# Patient Record
Sex: Male | Born: 1965 | Race: White | Hispanic: No | Marital: Married | Smoking: Former smoker
Health system: Southern US, Community
[De-identification: ages and names within clinical notes are randomized; demographics above are authoritative.]

## PROBLEM LIST (undated history)

## (undated) DIAGNOSIS — K219 Gastro-esophageal reflux disease without esophagitis: Secondary | ICD-10-CM

## (undated) DIAGNOSIS — E785 Hyperlipidemia, unspecified: Secondary | ICD-10-CM

## (undated) DIAGNOSIS — F419 Anxiety disorder, unspecified: Secondary | ICD-10-CM

## (undated) DIAGNOSIS — R079 Chest pain, unspecified: Secondary | ICD-10-CM

## (undated) DIAGNOSIS — I10 Essential (primary) hypertension: Secondary | ICD-10-CM

## (undated) DIAGNOSIS — T7840XA Allergy, unspecified, initial encounter: Secondary | ICD-10-CM

## (undated) DIAGNOSIS — E559 Vitamin D deficiency, unspecified: Secondary | ICD-10-CM

## (undated) DIAGNOSIS — R0602 Shortness of breath: Secondary | ICD-10-CM

## (undated) DIAGNOSIS — E291 Testicular hypofunction: Secondary | ICD-10-CM

## (undated) DIAGNOSIS — E669 Obesity, unspecified: Secondary | ICD-10-CM

## (undated) DIAGNOSIS — R0982 Postnasal drip: Secondary | ICD-10-CM

## (undated) DIAGNOSIS — R7303 Prediabetes: Secondary | ICD-10-CM

## (undated) DIAGNOSIS — R7309 Other abnormal glucose: Secondary | ICD-10-CM

## (undated) HISTORY — PX: 24 HOUR PH STUDY: SHX5419

## (undated) HISTORY — DX: Allergy, unspecified, initial encounter: T78.40XA

## (undated) HISTORY — PX: POLYPECTOMY: SHX149

## (undated) HISTORY — DX: Other abnormal glucose: R73.09

## (undated) HISTORY — PX: COLONOSCOPY: SHX174

## (undated) HISTORY — DX: Hyperlipidemia, unspecified: E78.5

## (undated) HISTORY — DX: Gastro-esophageal reflux disease without esophagitis: K21.9

## (undated) HISTORY — DX: Postnasal drip: R09.82

## (undated) HISTORY — DX: Testicular hypofunction: E29.1

## (undated) HISTORY — DX: Chest pain, unspecified: R07.9

## (undated) HISTORY — PX: OTHER SURGICAL HISTORY: SHX169

## (undated) HISTORY — DX: Obesity, unspecified: E66.9

## (undated) HISTORY — DX: Prediabetes: R73.03

## (undated) HISTORY — DX: Vitamin D deficiency, unspecified: E55.9

## (undated) HISTORY — DX: Shortness of breath: R06.02

## (undated) HISTORY — DX: Essential (primary) hypertension: I10

## (undated) HISTORY — DX: Anxiety disorder, unspecified: F41.9

---

## 1998-08-16 ENCOUNTER — Ambulatory Visit (HOSPITAL_COMMUNITY): Admission: RE | Admit: 1998-08-16 | Discharge: 1998-08-16 | Payer: Self-pay | Admitting: Internal Medicine

## 1998-08-16 ENCOUNTER — Encounter: Payer: Self-pay | Admitting: Internal Medicine

## 1999-08-22 ENCOUNTER — Encounter: Payer: Self-pay | Admitting: Internal Medicine

## 1999-08-22 ENCOUNTER — Ambulatory Visit (HOSPITAL_COMMUNITY): Admission: RE | Admit: 1999-08-22 | Discharge: 1999-08-22 | Payer: Self-pay | Admitting: Internal Medicine

## 2002-05-06 HISTORY — PX: SQUAMOUS CELL CARCINOMA EXCISION: SHX2433

## 2002-05-06 HISTORY — PX: SKIN CANCER EXCISION: SHX779

## 2004-01-19 ENCOUNTER — Ambulatory Visit (HOSPITAL_COMMUNITY): Admission: RE | Admit: 2004-01-19 | Discharge: 2004-01-19 | Payer: Self-pay | Admitting: Internal Medicine

## 2009-01-20 ENCOUNTER — Ambulatory Visit (HOSPITAL_COMMUNITY): Admission: RE | Admit: 2009-01-20 | Discharge: 2009-01-20 | Payer: Self-pay | Admitting: Internal Medicine

## 2010-06-15 ENCOUNTER — Ambulatory Visit (HOSPITAL_BASED_OUTPATIENT_CLINIC_OR_DEPARTMENT_OTHER)
Admission: RE | Admit: 2010-06-15 | Discharge: 2010-06-15 | Disposition: A | Discharge status: Home / Self Care | Payer: Medicare HMO | Attending: Urology | Admitting: Urology

## 2010-06-15 ENCOUNTER — Other Ambulatory Visit: Payer: Self-pay | Admitting: Urology

## 2010-06-15 DIAGNOSIS — D294 Benign neoplasm of scrotum: Secondary | ICD-10-CM | POA: Insufficient documentation

## 2010-06-15 DIAGNOSIS — Z01810 Encounter for preprocedural cardiovascular examination: Secondary | ICD-10-CM | POA: Insufficient documentation

## 2010-06-15 DIAGNOSIS — Z01812 Encounter for preprocedural laboratory examination: Secondary | ICD-10-CM | POA: Insufficient documentation

## 2010-06-15 LAB — POCT I-STAT 4, (NA,K, GLUC, HGB,HCT)
Glucose, Bld: 111 mg/dL — ABNORMAL HIGH (ref 70–99)
HCT: 48 % (ref 39.0–52.0)
Hemoglobin: 16.3 g/dL (ref 13.0–17.0)
Potassium: 4.1 mEq/L (ref 3.5–5.1)
Sodium: 141 mEq/L (ref 135–145)

## 2010-06-18 NOTE — Op Note (Signed)
  NAME:  Ryan Nguyen, Ryan Nguyen             ACCOUNT NO.:  [id number]  MEDICAL RECORD NO.:  [id number]          PATIENT TYPE:  AMB  LOCATION:  [facility]                         FACILITY:  [facility]  PHYSICIAN:  Maretta Bees. Vonita Moss, M.D.DATE OF BIRTH:  10/18/1965  DATE OF PROCEDURE:  06/15/2010 DATE OF DISCHARGE:                              OPERATIVE REPORT   PREOPERATIVE DIAGNOSIS:  Scrotal wall mole.  POSTOPERATIVE DIAGNOSIS:  Scrotal wall mole.  PROCEDURE:  Excision of a 3-cm scrotal mole.  SURGEON:  Maretta Bees. Vonita Moss, M.D.  ANESTHESIA:  MAC and local.  INDICATIONS:  This gentleman has had a longstanding brownish-colored mole on the scrotum that is becoming irritating and bothersome when it rubs on his clothing.  He wishes excision.  PROCEDURE IN DETAIL:  The patient was brought to the operating room and given conscious sedation.  The external genitalia were prepped and draped in the usual fashion and 0.5% Marcaine was injected under andaround this lesion on the scrotum for local anesthesia.  A knife blade was used to excise this totally.  A few small bleeders were electrocoagulated.  The scrotal incision was closed in two layers of running 3-0 chromic catgut.  The wound was cleaned and dressed with Neosporin and a dry sterile gauze.  Sponge, needle and instrument count was correct.  Blood loss was essentially zero.  He tolerated the procedure well.     Maretta Bees. Vonita Moss, M.D.     LJP/MEDQ  D:  06/15/2010  T:  06/15/2010  Job:  [id number]  cc:   Lucky Cowboy, M.D. Fax: [phone]  Electronically Signed by Larey Dresser M.D. on 06/18/2010 01:22:06 PM

## 2012-05-08 ENCOUNTER — Other Ambulatory Visit (HOSPITAL_COMMUNITY): Payer: Self-pay | Admitting: Internal Medicine

## 2012-05-08 ENCOUNTER — Ambulatory Visit (HOSPITAL_COMMUNITY)
Admission: RE | Admit: 2012-05-08 | Discharge: 2012-05-08 | Disposition: A | Discharge status: Home / Self Care | Payer: BC Managed Care – PPO | Source: Ambulatory Visit | Attending: Internal Medicine | Admitting: Internal Medicine

## 2012-05-08 DIAGNOSIS — R05 Cough: Secondary | ICD-10-CM | POA: Insufficient documentation

## 2012-05-08 DIAGNOSIS — Z87891 Personal history of nicotine dependence: Secondary | ICD-10-CM | POA: Insufficient documentation

## 2012-05-08 DIAGNOSIS — R059 Cough, unspecified: Secondary | ICD-10-CM | POA: Insufficient documentation

## 2012-05-08 DIAGNOSIS — J9819 Other pulmonary collapse: Secondary | ICD-10-CM | POA: Insufficient documentation

## 2012-05-08 DIAGNOSIS — R0602 Shortness of breath: Secondary | ICD-10-CM | POA: Insufficient documentation

## 2012-06-09 ENCOUNTER — Ambulatory Visit (HOSPITAL_COMMUNITY)
Admission: RE | Admit: 2012-06-09 | Discharge: 2012-06-09 | Disposition: A | Discharge status: Home / Self Care | Payer: BC Managed Care – PPO | Source: Ambulatory Visit | Attending: Internal Medicine | Admitting: Internal Medicine

## 2012-06-09 ENCOUNTER — Other Ambulatory Visit (HOSPITAL_COMMUNITY): Payer: Self-pay | Admitting: Internal Medicine

## 2012-06-09 DIAGNOSIS — J189 Pneumonia, unspecified organism: Secondary | ICD-10-CM | POA: Insufficient documentation

## 2012-06-09 DIAGNOSIS — R05 Cough: Secondary | ICD-10-CM | POA: Insufficient documentation

## 2012-06-09 DIAGNOSIS — R059 Cough, unspecified: Secondary | ICD-10-CM | POA: Insufficient documentation

## 2012-11-05 ENCOUNTER — Encounter: Payer: Self-pay | Admitting: Cardiovascular Disease

## 2012-12-14 ENCOUNTER — Encounter: Payer: Self-pay | Admitting: Cardiovascular Disease

## 2012-12-23 ENCOUNTER — Ambulatory Visit (INDEPENDENT_AMBULATORY_CARE_PROVIDER_SITE_OTHER): Payer: BC Managed Care – PPO | Admitting: Cardiovascular Disease

## 2012-12-23 ENCOUNTER — Encounter: Payer: Self-pay | Admitting: *Deleted

## 2012-12-23 VITALS — BP 126/86 | HR 61 | Ht 70.0 in | Wt 272.4 lb

## 2012-12-23 DIAGNOSIS — R9431 Abnormal electrocardiogram [ECG] [EKG]: Secondary | ICD-10-CM

## 2012-12-23 DIAGNOSIS — R0600 Dyspnea, unspecified: Secondary | ICD-10-CM

## 2012-12-23 DIAGNOSIS — R079 Chest pain, unspecified: Secondary | ICD-10-CM

## 2012-12-23 DIAGNOSIS — E119 Type 2 diabetes mellitus without complications: Secondary | ICD-10-CM

## 2012-12-23 DIAGNOSIS — E785 Hyperlipidemia, unspecified: Secondary | ICD-10-CM

## 2012-12-23 DIAGNOSIS — R0609 Other forms of dyspnea: Secondary | ICD-10-CM

## 2012-12-23 DIAGNOSIS — I10 Essential (primary) hypertension: Secondary | ICD-10-CM | POA: Insufficient documentation

## 2012-12-23 DIAGNOSIS — R0989 Other specified symptoms and signs involving the circulatory and respiratory systems: Secondary | ICD-10-CM

## 2012-12-23 DIAGNOSIS — R0602 Shortness of breath: Secondary | ICD-10-CM

## 2012-12-23 NOTE — Assessment & Plan Note (Signed)
Atypical but multiple risk factors and abnormal ECG  F/u stress myovue

## 2012-12-23 NOTE — Assessment & Plan Note (Signed)
Discussed low carb diet.  Target hemoglobin A1c is 6.5 or less.  Continue current medications. He understands diabetes is from weight gain

## 2012-12-23 NOTE — Assessment & Plan Note (Signed)
Cholesterol is at goal.  Continue current dose of statin and diet Rx.  No myalgias or side effects.  F/U  LFT's in 6 months. No results found for this basename: LDLCALC  Labs with pirmary           

## 2012-12-23 NOTE — Progress Notes (Signed)
Patient ID: Ryan Nguyen, male   DOB: 07-26-1965, 47 y.o.   MRN: [id number] 47 yo referred for dyspnea and chest pain.  No documented CAD  CRF;s HTN elevated lipids and and recent diagnosis of diabetes.  Issue is severe weight gain over the last 10 years.  This has caused DM, reflux and dyspnea.  He smoked 2ppd since age 83 and quit in January. Diet is bad with lots of carbs and mild shakes Sedentary and does not exercies 2 episodes of pain last 6 months Both after breakfast with GI overtones and not taking his reflux meds. No PND orthopnea Mild dependant edema.  Does not carry diagnosis of COPD but long time smoker No cough or sputum  Likely OSA.  No previous stress testing  ROS: Denies fever, malais, weight loss, blurry vision, decreased visual acuity, cough, sputum, SOB, hemoptysis, pleuritic pain, palpitaitons, heartburn, abdominal pain, melena, lower extremity edema, claudication, or rash.  All other systems reviewed and negative   General: Affect appropriate Obese white male HEENT: normal Neck supple with no adenopathy JVP normal no bruits no thyromegaly Lungs clear with mild expitory wheezing and good diaphragmatic motion Heart:  S1/S2 no murmur,rub, gallop or click PMI normal Abdomen: benighn, BS positve, no tenderness, no AAA no bruit.  No HSM or HJR Distal pulses intact with no bruits No edema Neuro non-focal Skin warm and dry No muscular weakness  Medications Current Outpatient Prescriptions  Medication Sig Dispense Refill  . atorvastatin (LIPITOR) 20 MG tablet Take 20 mg by mouth daily.      . citalopram (CELEXA) 40 MG tablet Take 40 mg by mouth daily.      Marland Kitchen dicyclomine (BENTYL) 20 MG tablet Take 20 mg by mouth every 6 (six) hours.      . metFORMIN (GLUCOPHAGE) 500 MG tablet Take 500 mg by mouth daily with breakfast.      . ranitidine (ZANTAC) 300 MG capsule Take 300 mg by mouth 2 (two) times daily.        No current facility-administered medications for this visit.     Allergies Bee venom and Chantix  Family History: Family History  Problem Relation Age of Onset  . Hypertension Mother   . Diabetes Sister   . Cancer Father     Lung cancer  . COPD Mother     Social History: History   Social History  . Marital Status: Married    Spouse Name: N/A    Number of Children: 2  . Years of Education: N/A   Occupational History  . Not on file.   Social History Main Topics  . Smoking status: Former Smoker    Quit date: 05/06/2012  . Smokeless tobacco: Not on file  . Alcohol Use: No  . Drug Use: No  . Sexual Activity: Not on file   Other Topics Concern  . Not on file   Social History Narrative  . No narrative on file    Electrocardiogram:  NSR rate 61 ICRBBB LAFB    Assessment and Plan

## 2012-12-23 NOTE — Patient Instructions (Addendum)
Your physician recommends that you schedule a follow-up appointment in:  AS NEEDED  Your physician recommends that you continue on your current medications as directed. Please refer to the Current Medication list given to you today.  Your physician has requested that you have an echocardiogram. Echocardiography is a painless test that uses sound waves to create images of your heart. It provides your doctor with information about the size and shape of your heart and how well your heart's chambers and valves are working. This procedure takes approximately one hour. There are no restrictions for this procedure.   Your physician has requested that you have en exercise stress myoview. For further information please visit [email or web address]. Please follow instruction sheet, as given.  

## 2012-12-23 NOTE — Assessment & Plan Note (Signed)
Clinically has some COPD and reactive airway disease Quit smoking in January F/U primary and consider PFT;s and inhaler F/U echo for RV and LV function.  Weight gain contributing Consider sleep study if he stays this heavy

## 2012-12-23 NOTE — Assessment & Plan Note (Signed)
Well controlled.  Continue current medications and low sodium Dash type diet.    

## 2013-01-11 ENCOUNTER — Ambulatory Visit (HOSPITAL_BASED_OUTPATIENT_CLINIC_OR_DEPARTMENT_OTHER): Payer: BC Managed Care – PPO | Admitting: Radiology

## 2013-01-11 ENCOUNTER — Ambulatory Visit (HOSPITAL_COMMUNITY): Payer: BC Managed Care – PPO | Attending: Cardiology

## 2013-01-11 VITALS — BP 106/69 | HR 53 | Ht 70.0 in | Wt 264.0 lb

## 2013-01-11 DIAGNOSIS — R0602 Shortness of breath: Secondary | ICD-10-CM

## 2013-01-11 DIAGNOSIS — I059 Rheumatic mitral valve disease, unspecified: Secondary | ICD-10-CM | POA: Insufficient documentation

## 2013-01-11 DIAGNOSIS — E785 Hyperlipidemia, unspecified: Secondary | ICD-10-CM | POA: Insufficient documentation

## 2013-01-11 DIAGNOSIS — I10 Essential (primary) hypertension: Secondary | ICD-10-CM | POA: Insufficient documentation

## 2013-01-11 DIAGNOSIS — I079 Rheumatic tricuspid valve disease, unspecified: Secondary | ICD-10-CM | POA: Insufficient documentation

## 2013-01-11 DIAGNOSIS — Z87891 Personal history of nicotine dependence: Secondary | ICD-10-CM | POA: Insufficient documentation

## 2013-01-11 DIAGNOSIS — R079 Chest pain, unspecified: Secondary | ICD-10-CM

## 2013-01-11 DIAGNOSIS — E119 Type 2 diabetes mellitus without complications: Secondary | ICD-10-CM | POA: Insufficient documentation

## 2013-01-11 DIAGNOSIS — R0609 Other forms of dyspnea: Secondary | ICD-10-CM | POA: Insufficient documentation

## 2013-01-11 DIAGNOSIS — R0989 Other specified symptoms and signs involving the circulatory and respiratory systems: Secondary | ICD-10-CM | POA: Insufficient documentation

## 2013-01-11 DIAGNOSIS — R072 Precordial pain: Secondary | ICD-10-CM | POA: Insufficient documentation

## 2013-01-11 DIAGNOSIS — R9431 Abnormal electrocardiogram [ECG] [EKG]: Secondary | ICD-10-CM

## 2013-01-11 MED ORDER — TECHNETIUM TC 99M SESTAMIBI GENERIC - CARDIOLITE
30.0000 | Freq: Once | INTRAVENOUS | Status: AC | PRN
  Administered 2013-01-11: 30 via INTRAVENOUS

## 2013-01-11 MED ORDER — TECHNETIUM TC 99M SESTAMIBI GENERIC - CARDIOLITE
10.0000 | Freq: Once | INTRAVENOUS | Status: AC | PRN
  Administered 2013-01-11: 10 via INTRAVENOUS

## 2013-01-11 NOTE — Progress Notes (Signed)
  [facility] 3 NUCLEAR MED [address] [place], [place] [address] [phone]    Cardiology Nuclear Med Study  Ryan Nguyen is a 47 y.o. male     MRN : [id number]     DOB: February 10, 1966  Procedure Date: 01/11/2013  Nuclear Med Background Indication for Stress Test:  Evaluation for Ischemia and Abnormal EKG History:  No previously documented CAD. Cardiac Risk Factors: History of Smoking, Hypertension, Lipids, NIDDM, Obesity and RBBB  Symptoms:  Chest Pain (last episode of chest discomfort was about 66-months ago) and DOE   Nuclear Pre-Procedure Caffeine/Decaff Intake:  None NPO After: 7:00pm   Lungs:  Clear. O2 Sat: 98% on room air. IV 0.9% NS with Angio Cath:  22g  IV Site: R Hand  IV Started by:  Cathlyn Parsons, RN  Chest Size (in):  60 Cup Size: n/a  Height: 5\' 10"  (1.778 m)  Weight:  264 lb (119.75 kg)  BMI:  Body mass index is 37.88 kg/(m^2). Tech Comments:  No Metformin this am    Nuclear Med Study 1 or 2 day study: 1 day  Stress Test Type:  Stress  Reading MD: Olga Millers, MD  Order Authorizing Provider:  Charlton Haws, MD  Resting Radionuclide: Technetium 14m Sestamibi  Resting Radionuclide Dose: 11.0 mCi   Stress Radionuclide:  Technetium 35m Sestamibi  Stress Radionuclide Dose: 33.0 mCi           Stress Protocol Rest HR: 53 Stress HR: 162  Rest BP: 106/69 Stress BP: 203/75  Exercise Time (min): 7:30 METS: 9.3   Predicted Max HR: 172 bpm % Max HR: 94.19 bpm Rate Pressure Product: 21308   Dose of Adenosine (mg):  n/a Dose of Lexiscan: n/a mg  Dose of Atropine (mg): n/a Dose of Dobutamine: n/a mcg/kg/min (at max HR)  Stress Test Technologist: Smiley Houseman, CMA-N  Nuclear Technologist:  Doyne Keel, CNMT     Rest Procedure:  Myocardial perfusion imaging was performed at rest 45 minutes following the intravenous administration of Technetium 52m Sestamibi.  Rest ECG: Sinus bradycardia, LAD, RVCD.  Stress Procedure:  The patient  exercised on the treadmill utilizing the Bruce Protocol for 7:30 minutes. The patient stopped due to fatigue and dyspnea and denied any chest pain.  Technetium 43m Sestamibi was injected at peak exercise and myocardial perfusion imaging was performed after a brief delay.  Stress ECG: Insignificant upsloping ST segment depression.  QPS Raw Data Images:  Acquisition technically good; LVE. Stress Images:  There is decreased uptake in the inferior lateral wall. Rest Images:  There is decreased uptake in the inferior lateral wall. Subtraction (SDS):  There is a fixed defect that is most consistent with a previous infarction. Transient Ischemic Dilatation (Normal <1.22):  n/a Lung/Heart Ratio (Normal <0.45):  0.49  Quantitative Gated Spect Images QGS EDV:  132 ml QGS ESV:  64 ml  Impression Exercise Capacity:  Fair exercise capacity. BP Response:  Normal blood pressure response. Clinical Symptoms:  There is dyspnea. ECG Impression:  Insignificant upsloping ST segment depression. Comparison with Prior Nuclear Study: No previous nuclear study performed  Overall Impression:  Low risk stress nuclear study with a fixed, medium size, severe intensity inferior lateral defect consistent with prior infarct; no significant ischemia.  LV Ejection Fraction: 51%.  LV Wall Motion:  Akinesis of the inferior lateral wall.   Olga Millers

## 2013-01-11 NOTE — Progress Notes (Signed)
Echocardiogram performed.  

## 2013-01-26 ENCOUNTER — Encounter: Payer: Self-pay | Admitting: Cardiovascular Disease

## 2013-01-26 ENCOUNTER — Encounter: Payer: Self-pay | Admitting: *Deleted

## 2013-01-26 DIAGNOSIS — E291 Testicular hypofunction: Secondary | ICD-10-CM | POA: Insufficient documentation

## 2013-01-27 ENCOUNTER — Encounter: Payer: Self-pay | Admitting: Cardiovascular Disease

## 2013-01-27 ENCOUNTER — Ambulatory Visit (INDEPENDENT_AMBULATORY_CARE_PROVIDER_SITE_OTHER): Payer: BC Managed Care – PPO | Admitting: Cardiovascular Disease

## 2013-01-27 ENCOUNTER — Encounter: Payer: Self-pay | Admitting: Cardiology

## 2013-01-27 VITALS — BP 118/76 | HR 53 | Ht 70.0 in | Wt 269.0 lb

## 2013-01-27 DIAGNOSIS — E785 Hyperlipidemia, unspecified: Secondary | ICD-10-CM

## 2013-01-27 DIAGNOSIS — I10 Essential (primary) hypertension: Secondary | ICD-10-CM

## 2013-01-27 DIAGNOSIS — Z01812 Encounter for preprocedural laboratory examination: Secondary | ICD-10-CM

## 2013-01-27 DIAGNOSIS — R079 Chest pain, unspecified: Secondary | ICD-10-CM

## 2013-01-27 NOTE — Assessment & Plan Note (Signed)
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

## 2013-01-27 NOTE — Progress Notes (Signed)
Patient ID: Ryan Nguyen, male   DOB: [birth year], 47 y.o.   MRN: [id number] 47 yo referred for dyspnea and chest pain. No documented CAD CRF;s HTN elevated lipids and and recent diagnosis of diabetes. Issue is severe weight gain over the last 10 years. This has caused DM, reflux and dyspnea. He smoked 2ppd since age 89 and quit in January. Diet is bad with lots of carbs and mild shakes Sedentary and does not exercies 2 episodes of pain last 6 months Both after breakfast with GI overtones and not taking his reflux meds. No PND orthopnea Mild dependant edema. Does not carry diagnosis of COPD but long time smoker No cough or sputum Likely OSA. No previous stress testing  F/U myovue abnormal  12/23/12 Overall Impression: Low risk stress nuclear study with a fixed, medium size, severe intensity inferior lateral defect consistent with prior infarct; no significant ischemia.  LV Ejection Fraction: 51%. LV Wall Motion: Akinesis of the inferior lateral wall.  Discussed results  With ? Of MI and need to know if CAD present recommended cath  He wants to do it in October  Scheduled for 10/17  ROS: Denies fever, malais, weight loss, blurry vision, decreased visual acuity, cough, sputum, SOB, hemoptysis, pleuritic pain, palpitaitons, heartburn, abdominal pain, melena, lower extremity edema, claudication, or rash.  All other systems reviewed and negative  General: Affect appropriate Healthy:  appears stated age HEENT: normal Neck supple with no adenopathy JVP normal no bruits no thyromegaly Lungs clear with no wheezing and good diaphragmatic motion Heart:  S1/S2 no murmur, no rub, gallop or click PMI normal Abdomen: benighn, BS positve, no tenderness, no AAA no bruit.  No HSM or HJR Distal pulses intact with no bruits No edema Neuro non-focal Skin warm and dry No muscular weakness   Current Outpatient Prescriptions  Medication Sig Dispense Refill  . atenolol (TENORMIN) 25 MG tablet Take 25 mg by  mouth daily.      Marland Kitchen atorvastatin (LIPITOR) 20 MG tablet Take 20 mg by mouth daily.      . citalopram (CELEXA) 40 MG tablet Take 40 mg by mouth daily.      . metFORMIN (GLUCOPHAGE) 500 MG tablet Take 500 mg by mouth daily with breakfast.      . ranitidine (ZANTAC) 300 MG capsule Take 300 mg by mouth 2 (two) times daily.        No current facility-administered medications for this visit.    Allergies  Bee venom and Chantix  Electrocardiogram:  8/20 SR LAFB ICRBBB   Assessment and Plan

## 2013-01-27 NOTE — Patient Instructions (Addendum)
Your physician recommends that you return for lab work in: 2 weeks,  BMET, CBC, PTINR  Your physician has requested that you have a Left Heart Radial cardiac catheterization. Cardiac catheterization is used to diagnose and/or treat various heart conditions. Doctors may recommend this procedure for a number of different reasons. The most common reason is to evaluate chest pain. Chest pain can be a symptom of coronary artery disease (CAD), and cardiac catheterization can show whether plaque is narrowing or blocking your heart's arteries. This procedure is also used to evaluate the valves, as well as measure the blood flow and oxygen levels in different parts of your heart. For further information please visit [email or web address] Please follow instruction sheet, as given.  Your physician recommends that you continue on your current medications as directed. Please refer to the Current Medication list given to you today.

## 2013-01-27 NOTE — Assessment & Plan Note (Signed)
Well controlled.  Continue current medications and low sodium Dash type diet.    

## 2013-01-27 NOTE — Assessment & Plan Note (Signed)
Cholesterol is at goal.  Continue current dose of statin and diet Rx.  No myalgias or side effects.  F/U  LFT's in 6 months. No results found for this basename: LDLCALC             

## 2013-01-29 ENCOUNTER — Other Ambulatory Visit: Payer: Self-pay | Admitting: Cardiovascular Disease

## 2013-01-29 DIAGNOSIS — R079 Chest pain, unspecified: Secondary | ICD-10-CM

## 2013-02-10 ENCOUNTER — Encounter (HOSPITAL_COMMUNITY): Payer: Self-pay | Admitting: Pharmacy Technician

## 2013-02-15 ENCOUNTER — Other Ambulatory Visit (INDEPENDENT_AMBULATORY_CARE_PROVIDER_SITE_OTHER): Payer: BC Managed Care – PPO

## 2013-02-15 DIAGNOSIS — Z01812 Encounter for preprocedural laboratory examination: Secondary | ICD-10-CM

## 2013-02-15 LAB — CBC WITH DIFFERENTIAL/PLATELET
Basophils Absolute: 0 10*3/uL (ref 0.0–0.1)
Eosinophils Absolute: 0.2 10*3/uL (ref 0.0–0.7)
Lymphocytes Relative: 27.1 % (ref 12.0–46.0)
MCHC: 34 g/dL (ref 30.0–36.0)
Neutrophils Relative %: 61.3 % (ref 43.0–77.0)
Platelets: 153 10*3/uL (ref 150.0–400.0)
RBC: 4.27 Mil/uL (ref 4.22–5.81)
RDW: 12.7 % (ref 11.5–14.6)

## 2013-02-15 LAB — BASIC METABOLIC PANEL
CO2: 28 mEq/L (ref 19–32)
Calcium: 9 mg/dL (ref 8.4–10.5)
Creatinine, Ser: 1 mg/dL (ref 0.4–1.5)
Glucose, Bld: 136 mg/dL — ABNORMAL HIGH (ref 70–99)

## 2013-02-15 LAB — PROTIME-INR
INR: 1.1 ratio — ABNORMAL HIGH (ref 0.8–1.0)
Prothrombin Time: 11.1 s (ref 10.2–12.4)

## 2013-02-19 ENCOUNTER — Ambulatory Visit (HOSPITAL_COMMUNITY)
Admission: RE | Admit: 2013-02-19 | Discharge: 2013-02-19 | Disposition: A | Discharge status: Home / Self Care | Payer: BC Managed Care – PPO | Source: Ambulatory Visit | Attending: Cardiovascular Disease | Admitting: Cardiovascular Disease

## 2013-02-19 ENCOUNTER — Encounter (HOSPITAL_COMMUNITY)
Admission: RE | Disposition: A | Discharge status: Home / Self Care | Payer: Self-pay | Source: Ambulatory Visit | Attending: Cardiovascular Disease

## 2013-02-19 DIAGNOSIS — I10 Essential (primary) hypertension: Secondary | ICD-10-CM | POA: Insufficient documentation

## 2013-02-19 DIAGNOSIS — R079 Chest pain, unspecified: Secondary | ICD-10-CM

## 2013-02-19 DIAGNOSIS — R9439 Abnormal result of other cardiovascular function study: Secondary | ICD-10-CM | POA: Insufficient documentation

## 2013-02-19 DIAGNOSIS — F17200 Nicotine dependence, unspecified, uncomplicated: Secondary | ICD-10-CM | POA: Insufficient documentation

## 2013-02-19 DIAGNOSIS — R0609 Other forms of dyspnea: Secondary | ICD-10-CM | POA: Insufficient documentation

## 2013-02-19 DIAGNOSIS — E119 Type 2 diabetes mellitus without complications: Secondary | ICD-10-CM | POA: Insufficient documentation

## 2013-02-19 DIAGNOSIS — R0989 Other specified symptoms and signs involving the circulatory and respiratory systems: Secondary | ICD-10-CM | POA: Insufficient documentation

## 2013-02-19 DIAGNOSIS — R635 Abnormal weight gain: Secondary | ICD-10-CM | POA: Insufficient documentation

## 2013-02-19 DIAGNOSIS — E785 Hyperlipidemia, unspecified: Secondary | ICD-10-CM | POA: Insufficient documentation

## 2013-02-19 HISTORY — PX: LEFT HEART CATHETERIZATION WITH CORONARY ANGIOGRAM: SHX5451

## 2013-02-19 LAB — GLUCOSE, CAPILLARY: Glucose-Capillary: 96 mg/dL (ref 70–99)

## 2013-02-19 SURGERY — LEFT HEART CATHETERIZATION WITH CORONARY ANGIOGRAM
Anesthesia: LOCAL

## 2013-02-19 MED ORDER — ASPIRIN EC 325 MG PO TBEC: 325.0000 mg | DELAYED_RELEASE_TABLET | Freq: Every day | ORAL | Status: DC

## 2013-02-19 MED ORDER — SODIUM CHLORIDE 0.9 % IV SOLN: 250.0000 mL | INTRAVENOUS | Status: DC | PRN

## 2013-02-19 MED ORDER — ASPIRIN 81 MG PO CHEW
324.0000 mg | CHEWABLE_TABLET | ORAL | Status: AC
  Administered 2013-02-19: 324 mg via ORAL
  Filled 2013-02-19: qty 4

## 2013-02-19 MED ORDER — HEPARIN SODIUM (PORCINE) 1000 UNIT/ML IJ SOLN
INTRAMUSCULAR | Status: AC
  Filled 2013-02-19: qty 1

## 2013-02-19 MED ORDER — MIDAZOLAM HCL 2 MG/2ML IJ SOLN
INTRAMUSCULAR | Status: AC
  Filled 2013-02-19: qty 2

## 2013-02-19 MED ORDER — FENTANYL CITRATE 0.05 MG/ML IJ SOLN
INTRAMUSCULAR | Status: AC
  Filled 2013-02-19: qty 2

## 2013-02-19 MED ORDER — LIDOCAINE HCL (PF) 1 % IJ SOLN
INTRAMUSCULAR | Status: AC
  Filled 2013-02-19: qty 30

## 2013-02-19 MED ORDER — NITROGLYCERIN 0.2 MG/ML ON CALL CATH LAB
INTRAVENOUS | Status: AC
  Filled 2013-02-19: qty 1

## 2013-02-19 MED ORDER — OXYCODONE-ACETAMINOPHEN 5-325 MG PO TABS: 1.0000 | ORAL_TABLET | ORAL | Status: DC | PRN

## 2013-02-19 MED ORDER — SODIUM CHLORIDE 0.9 % IJ SOLN: 3.0000 mL | Freq: Two times a day (BID) | INTRAMUSCULAR | Status: DC

## 2013-02-19 MED ORDER — HEPARIN (PORCINE) IN NACL 2-0.9 UNIT/ML-% IJ SOLN
INTRAMUSCULAR | Status: AC
  Filled 2013-02-19: qty 1000

## 2013-02-19 MED ORDER — ONDANSETRON HCL 4 MG/2ML IJ SOLN: 4.0000 mg | Freq: Four times a day (QID) | INTRAMUSCULAR | Status: DC | PRN

## 2013-02-19 MED ORDER — VERAPAMIL HCL 2.5 MG/ML IV SOLN
INTRAVENOUS | Status: AC
  Filled 2013-02-19: qty 2

## 2013-02-19 MED ORDER — ACETAMINOPHEN 325 MG PO TABS: 650.0000 mg | ORAL_TABLET | ORAL | Status: DC | PRN

## 2013-02-19 MED ORDER — SODIUM CHLORIDE 0.9 % IV SOLN
INTRAVENOUS | Status: DC
  Administered 2013-02-19: 08:00:00 via INTRAVENOUS

## 2013-02-19 MED ORDER — SODIUM CHLORIDE 0.9 % IJ SOLN: 3.0000 mL | INTRAMUSCULAR | Status: DC | PRN

## 2013-02-19 MED ORDER — SODIUM CHLORIDE 0.45 % IV SOLN: INTRAVENOUS | Status: DC

## 2013-02-19 NOTE — H&P (View-Only) (Signed)
Patient ID: Artavius Stearns, male   DOB: November 11, 1965, 47 y.o.   MRN: [id number] 47 yo referred for dyspnea and chest pain. No documented CAD CRF;s HTN elevated lipids and and recent diagnosis of diabetes. Issue is severe weight gain over the last 10 years. This has caused DM, reflux and dyspnea. He smoked 2ppd since age 47 and quit in January. Diet is bad with lots of carbs and mild shakes Sedentary and does not exercies 2 episodes of pain last 6 months Both after breakfast with GI overtones and not taking his reflux meds. No PND orthopnea Mild dependant edema. Does not carry diagnosis of COPD but long time smoker No cough or sputum Likely OSA. No previous stress testing  F/U myovue abnormal  12/23/12 Overall Impression: Low risk stress nuclear study with a fixed, medium size, severe intensity inferior lateral defect consistent with prior infarct; no significant ischemia.  LV Ejection Fraction: 51%. LV Wall Motion: Akinesis of the inferior lateral wall.  Discussed results  With ? Of MI and need to know if CAD present recommended cath  He wants to do it in October  Scheduled for 10/17  ROS: Denies fever, malais, weight loss, blurry vision, decreased visual acuity, cough, sputum, SOB, hemoptysis, pleuritic pain, palpitaitons, heartburn, abdominal pain, melena, lower extremity edema, claudication, or rash.  All other systems reviewed and negative  General: Affect appropriate Healthy:  appears stated age HEENT: normal Neck supple with no adenopathy JVP normal no bruits no thyromegaly Lungs clear with no wheezing and good diaphragmatic motion Heart:  S1/S2 no murmur, no rub, gallop or click PMI normal Abdomen: benighn, BS positve, no tenderness, no AAA no bruit.  No HSM or HJR Distal pulses intact with no bruits No edema Neuro non-focal Skin warm and dry No muscular weakness   Current Outpatient Prescriptions  Medication Sig Dispense Refill  . atenolol (TENORMIN) 25 MG tablet Take 25 mg by  mouth daily.      Marland Kitchen atorvastatin (LIPITOR) 20 MG tablet Take 20 mg by mouth daily.      . citalopram (CELEXA) 40 MG tablet Take 40 mg by mouth daily.      . metFORMIN (GLUCOPHAGE) 500 MG tablet Take 500 mg by mouth daily with breakfast.      . ranitidine (ZANTAC) 300 MG capsule Take 300 mg by mouth 2 (two) times daily.        No current facility-administered medications for this visit.    Allergies  Bee venom and Chantix  Electrocardiogram:  8/20 SR LAFB ICRBBB   Assessment and Plan

## 2013-02-19 NOTE — Interval H&P Note (Signed)
Cath Lab Visit (complete for each Cath Lab visit)  Clinical Evaluation Leading to the Procedure:   ACS: no  Non-ACS:    Anginal Classification: CCS II  Anti-ischemic medical therapy: Maximal Therapy (2 or more classes of medications)  Non-Invasive Test Results: Low-risk stress test findings: cardiac mortality <1%/year  Prior CABG: No previous CABG      History and Physical Interval Note:  02/19/2013 7:54 AM  Ryan Nguyen  has presented today for surgery, with the diagnosis of cp/abnormal mioview  The various methods of treatment have been discussed with the patient and family. After consideration of risks, benefits and other options for treatment, the patient has consented to  Procedure(s): LEFT HEART CATHETERIZATION WITH CORONARY ANGIOGRAM (N/A) as a surgical intervention .  The patient's history has been reviewed, patient examined, no change in status, stable for surgery.  I have reviewed the patient's chart and labs.  Questions were answered to the patient's satisfaction.     Charlton Haws

## 2013-02-19 NOTE — CV Procedure (Signed)
    Cardiac Catheterization Procedure Note  Name: Ryan Nguyen MRN: [id number] DOB: 10-Oct-1965  Procedure: Left Heart Cath, Selective Coronary Angiography, LV angiography  Indication: Chest Pain   Procedural Details: The right wrist was prepped, draped, and anesthetized with 1% lidocaine. Using the modified Seldinger technique, a 5 French sheath was introduced into the right radial artery. 3 mg of verapamil was administered through the sheath, weight-based unfractionated heparin was administered intravenously. Standard Judkins catheters were used for selective coronary angiography and left ventriculography. Catheter exchanges were performed over an exchange length guidewire. There were no immediate procedural complications. A TR band was used for radial hemostasis at the completion of the procedure.  The patient was transferred to the post catheterization recovery area for further monitoring.  Procedural Findings: Hemodynamics: AO 111/ 68  LV  110/10  Coronary angiography: Coronary dominance: right  Left mainstem:  Short segment fairly side by side ostia normal  Left anterior descending (LAD): Normal   IM: small vessel normal  D1: large branching vessel normal  Left circumflex (LCx): Normal  OM1: large branching vessel normal  AV Groove:  normal  Right coronary artery (RCA): dominant  Initially had catheter tip spasm in proximal vessel  Relieved with IC nitro.  Normal   PD: normal  PLA: normal  Left ventriculography: Left ventricular systolic function is normal, LVEF is estimated at 55-65%, there is no significant mitral regurgitation   Final Conclusions:  No significant CAD  Continue risk factor modification  Recommendations: Medical Rx  Charlton Haws 02/19/2013, 9:07 AM

## 2013-02-22 ENCOUNTER — Telehealth: Payer: Self-pay | Admitting: *Deleted

## 2013-02-22 MED ORDER — LISINOPRIL 5 MG PO TABS: 5.0000 mg | ORAL_TABLET | Freq: Every day | ORAL | Status: DC

## 2013-02-22 NOTE — Telephone Encounter (Signed)
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

## 2013-02-22 NOTE — Telephone Encounter (Signed)
         Lamarius, Dirr - 01/27/13 ','<More Detail >>       Wendall Stade, MD       Sent: Caleen Essex February 19, 2013 9:16 AM    To: Alois Cliche, LPN; Deliah Goody, RN                   Message     Needs lisinopril 5mg  called in Atenolol is d/c F/U with me in office next available                      Patient Demographics     Patient Name Sex DOB Age SSN Address Phone    Ryan Nguyen, Ryan Nguyen Male November 19, 1965 [id number] y.o.) [id number] [address] [facility] [place] [address] [phone] (Home) [phone] (Work) [phone] (Mobile)          Case Information           General Information     No case/log ID found          Wound Class     No data filed          Operative Note     No notes of this type exist for this encounter.          Nursing Notes     No notes of this type exist for this encounter.         Staff and Times           Anesthesia Staff Information     No case/log ID found         Pre-Incision Documentation           Verification at Registration     No case/log ID found          Positioning Information     No log ID found.         Closing Documentation           PNDS Information     No log ID found.         Equipment/Instruments/Supplies           Instruments     No case/log ID found          Supplies     No case/log ID found

## 2013-03-10 ENCOUNTER — Encounter: Payer: Self-pay | Admitting: Cardiovascular Disease

## 2013-03-10 ENCOUNTER — Ambulatory Visit (INDEPENDENT_AMBULATORY_CARE_PROVIDER_SITE_OTHER): Payer: BC Managed Care – PPO | Admitting: Cardiovascular Disease

## 2013-03-10 VITALS — BP 135/82 | HR 89 | Ht 70.0 in | Wt 271.2 lb

## 2013-03-10 DIAGNOSIS — E785 Hyperlipidemia, unspecified: Secondary | ICD-10-CM

## 2013-03-10 DIAGNOSIS — I10 Essential (primary) hypertension: Secondary | ICD-10-CM

## 2013-03-10 DIAGNOSIS — R079 Chest pain, unspecified: Secondary | ICD-10-CM

## 2013-03-10 DIAGNOSIS — E119 Type 2 diabetes mellitus without complications: Secondary | ICD-10-CM

## 2013-03-10 NOTE — Assessment & Plan Note (Signed)
Discussed low carb diet.  Target hemoglobin A1c is 6.5 or less.  Continue current medications.  

## 2013-03-10 NOTE — Assessment & Plan Note (Signed)
False positive stress test cath ok  Continue weiight loss and risk factor modification

## 2013-03-10 NOTE — Patient Instructions (Addendum)
Your physician wants you to follow-up in: YEAR WITH DR NISHAN  You will receive a reminder letter in the mail two months in advance. If you don't receive a letter, please call our office to schedule the follow-up appointment.  Your physician recommends that you continue on your current medications as directed. Please refer to the Current Medication list given to you today. 

## 2013-03-10 NOTE — Assessment & Plan Note (Signed)
Cholesterol is at goal.  Continue current dose of statin and diet Rx.  No myalgias or side effects.  F/U  LFT's in 6 months. No results found for this basename: LDLCALC             

## 2013-03-10 NOTE — Progress Notes (Signed)
Patient ID: Ryan Nguyen, male   DOB: [birth year], 47 y.o.   MRN: [id number] 47 yo referred for dyspnea and chest pain. No documented CAD CRF;s HTN elevated lipids and and recent diagnosis of diabetes. Issue is severe weight gain over the last 10 years. This has caused DM, reflux and dyspnea. He smoked 2ppd since age 76 and quit in January. Diet is bad with lots of carbs and mild shakes Sedentary and does not exercies 2 episodes of pain last 6 months Both after breakfast with GI overtones and not taking his reflux meds. No PND orthopnea Mild dependant edema. Does not carry diagnosis of COPD but long time smoker No cough or sputum Likely OSA. 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  LV Ejection Fraction: 51%. LV Wall Motion: Akinesis of the inferior lateral wall.  Cath 02/19/13 with no significant Epicardial CAD and normal EF   ROS: Denies fever, malais, weight loss, blurry vision, decreased visual acuity, cough, sputum, SOB, hemoptysis, pleuritic pain, palpitaitons, heartburn, abdominal pain, melena, lower extremity edema, claudication, or rash.  All other systems reviewed and negative  General: Affect appropriate Healthy:  appears stated age HEENT: normal Neck supple with no adenopathy JVP normal no bruits no thyromegaly Lungs clear with no wheezing and good diaphragmatic motion Heart:  S1/S2 no murmur, no rub, gallop or click PMI normal Abdomen: benighn, BS positve, no tenderness, no AAA no bruit.  No HSM or HJR Distal pulses intact with no bruits No edema Neuro non-focal Skin warm and dry No muscular weakness   Current Outpatient Prescriptions  Medication Sig Dispense Refill  . citalopram (CELEXA) 40 MG tablet Take 40 mg by mouth daily.      Marland Kitchen dicyclomine (BENTYL) 20 MG tablet Take 20 mg by  mouth 3 (three) times daily.      Marland Kitchen lisinopril (PRINIVIL,ZESTRIL) 5 MG tablet Take 1 tablet (5 mg total) by mouth daily.  30 tablet  6  . metFORMIN (GLUCOPHAGE) 500 MG tablet Take 500 mg by mouth 3 (three) times daily.       Marland Kitchen OVER THE COUNTER MEDICATION Take 1 tablet by mouth daily. Vitamin D 2  1.25 mg      . ranitidine (ZANTAC) 300 MG capsule Take 300 mg by mouth 2 (two) times daily.        No current facility-administered medications for this visit.    Allergies  Bee venom and Chantix  Electrocardiogram: 02/23/13 SR RATE 58 LAD [id number]   Assessment and Plan

## 2013-03-10 NOTE — Assessment & Plan Note (Signed)
Well controlled.  Continue current medications and low sodium Dash type diet.    

## 2013-04-06 ENCOUNTER — Other Ambulatory Visit: Payer: BC Managed Care – PPO

## 2013-04-13 ENCOUNTER — Other Ambulatory Visit: Payer: BC Managed Care – PPO

## 2013-04-19 ENCOUNTER — Other Ambulatory Visit: Payer: Self-pay | Admitting: Emergency Medicine

## 2013-04-19 MED ORDER — RANITIDINE HCL 300 MG PO CAPS: 300.0000 mg | ORAL_CAPSULE | Freq: Two times a day (BID) | ORAL | Status: DC

## 2013-04-29 ENCOUNTER — Encounter: Payer: Self-pay | Admitting: Internal Medicine

## 2013-04-29 DIAGNOSIS — I10 Essential (primary) hypertension: Secondary | ICD-10-CM

## 2013-04-29 DIAGNOSIS — E559 Vitamin D deficiency, unspecified: Secondary | ICD-10-CM | POA: Insufficient documentation

## 2013-04-29 DIAGNOSIS — E785 Hyperlipidemia, unspecified: Secondary | ICD-10-CM | POA: Insufficient documentation

## 2013-05-04 ENCOUNTER — Ambulatory Visit (INDEPENDENT_AMBULATORY_CARE_PROVIDER_SITE_OTHER): Payer: BC Managed Care – PPO | Admitting: Emergency Medicine

## 2013-05-04 ENCOUNTER — Encounter: Payer: Self-pay | Admitting: Emergency Medicine

## 2013-05-04 VITALS — BP 138/82 | HR 84 | Temp 97.8°F | Resp 18 | Ht 70.5 in | Wt 270.0 lb

## 2013-05-04 DIAGNOSIS — E782 Mixed hyperlipidemia: Secondary | ICD-10-CM

## 2013-05-04 DIAGNOSIS — R7309 Other abnormal glucose: Secondary | ICD-10-CM

## 2013-05-04 DIAGNOSIS — I10 Essential (primary) hypertension: Secondary | ICD-10-CM

## 2013-05-04 LAB — CBC WITH DIFFERENTIAL/PLATELET
Basophils Absolute: 0.1 10*3/uL (ref 0.0–0.1)
Basophils Relative: 1 % (ref 0–1)
Eosinophils Absolute: 0.5 10*3/uL (ref 0.0–0.7)
Lymphocytes Relative: 24 % (ref 12–46)
Lymphs Abs: 2 10*3/uL (ref 0.7–4.0)
MCH: 31.6 pg (ref 26.0–34.0)
MCHC: 34.7 g/dL (ref 30.0–36.0)
Neutro Abs: 4.9 10*3/uL (ref 1.7–7.7)
Neutrophils Relative %: 60 % (ref 43–77)
Platelets: 220 10*3/uL (ref 150–400)
RBC: 4.72 MIL/uL (ref 4.22–5.81)
RDW: 13 % (ref 11.5–15.5)

## 2013-05-04 LAB — HEMOGLOBIN A1C
Hgb A1c MFr Bld: 6 % — ABNORMAL HIGH (ref <5.7)
Mean Plasma Glucose: 126 mg/dL — ABNORMAL HIGH (ref <117)

## 2013-05-04 NOTE — Progress Notes (Signed)
   Subjective:    Patient ID: Ryan Nguyen, male    DOB: [birth year], 47 y.o.   MRN: [id number]  HPI Comments: 47 yo male  presents for 3 month F/U for HTN, Cholesterol, DM, D. Deficient follow up.  He notes he occasionally wakes up with bruise on right eye brow. He does not recall trauma, he notes it happens about once every couple of months and usually resolves in a few days. He denies any other bruising or FMH of blood/ clotting disorders.     Has started exercising more. He has been trying to lose weight. He is taking 3 metformin daily with out SE. A1C 5.8 INSULIN 64 T 156 TG 153 H 39 L 86   Review of Systems  Hematological: Bruises/bleeds easily.  All other systems reviewed and are negative.   BP 138/82  Pulse 84  Temp(Src) 97.8 F (36.6 C) (Temporal)  Resp 18  Ht 5' 10.5" (1.791 m)  Wt 270 lb (122.471 kg)  BMI 38.18 kg/m2     Objective:   Physical Exam  Nursing note and vitals reviewed. Constitutional: He is oriented to person, place, and time. He appears well-developed and well-nourished.  Obese  HENT:  Head: Normocephalic and atraumatic.  Right Ear: External ear normal.  Left Ear: External ear normal.  Nose: Nose normal.  Eyes: Conjunctivae and EOM are normal.  Neck: Normal range of motion. Neck supple. No JVD present. No thyromegaly present.  Cardiovascular: Normal rate, regular rhythm, normal heart sounds and intact distal pulses.   Pulmonary/Chest: Effort normal and breath sounds normal.  Abdominal: Soft. Bowel sounds are normal. He exhibits no distension and no mass. There is no tenderness. There is no rebound and no guarding.  Musculoskeletal: Normal range of motion. He exhibits no edema and no tenderness.  Lymphadenopathy:    He has no cervical adenopathy.  Neurological: He is alert and oriented to person, place, and time. He has normal reflexes. No cranial nerve deficit. Coordination normal.  Skin: Skin is warm and dry.  Right eye brow 1.5cm  ecchymosis  Psychiatric: He has a normal mood and affect. His behavior is normal. Judgment and thought content normal.          Assessment & Plan:  1.  3 month F/U for Obesity, HTN, Cholesterol,DM, D. Deficient. Needs healthy diet, cardio QD and obtain healthy weight. Check Labs, Check BP if >130/80 call office, Check BS if >200 call office. Freestyle Lite meter and education given 2. Obesity- may consider wt loss drug if no improvement by next OV 3. ? brusing on Eye brow- ? Trauma while sleeping, body scan performed without any other unusual findings, he will monitor and call with reults

## 2013-05-04 NOTE — Patient Instructions (Signed)
Obesity Obesity is having too much body fat and a body mass index (BMI) of 30 or more. BMI is a number based on your height and weight. The number is an estimate of how much body fat you have. Obesity can happen if you eat more calories than you can burn by exercising or other activity. It can cause major health problems or emergencies.  HOME CARE  Exercise and be active as told by your doctor. Try:  Using stairs when you can.  Parking farther away from store doors.  Gardening, biking, or walking.  Eat healthy foods and drinks that are low in calories. Eat more fruits and vegetables.  Limit fast food, sweets, and snack foods that are made with ingredients that are not natural (processed food).  Eat smaller amounts of food.  Keep a journal and write down what you eat every day. Websites can help with this.  Avoid drinking alcohol. Drink more water and drinks without calories.   Take vitamins and dietary pills (supplements) only as told by your doctor.  Try going to weight-loss support groups or classes to help lessen stress. Dieticians and counselors may also help. GET HELP RIGHT AWAY IF:  You have chest pain or tightness.  You have trouble breathing or feel short of breath.  You feel weak or have loss of feeling (numbness) in your legs.  You feel confused or have trouble talking.  You have sudden changes in your vision. MAKE SURE YOU:  Understand these instructions.  Will watch your condition.  Will get help right away if you are not doing well or get worse. Document Released: 07/15/2011 Document Reviewed: 07/15/2011 [facility] Patient Information 2014 [place], [place]. Diabetes and Exercise Exercising regularly is important. It is not just about losing weight. It has many health benefits, such as:  Improving your overall fitness, flexibility, and endurance.  Increasing your bone density.  Helping with weight control.  Decreasing your body fat.  Increasing your  muscle strength.  Reducing stress and tension.  Improving your overall health. People with diabetes who exercise gain additional benefits because exercise:  Reduces appetite.  Improves the body's use of blood sugar (glucose).  Helps lower or control blood glucose.  Decreases blood pressure.  Helps control blood lipids (such as cholesterol and triglycerides).  Improves the body's use of the hormone insulin by:  Increasing the body's insulin sensitivity.  Reducing the body's insulin needs.  Decreases the risk for heart disease because exercising:  Lowers cholesterol and triglycerides levels.  Increases the levels of good cholesterol (such as high-density lipoproteins [HDL]) in the body.  Lowers blood glucose levels. YOUR ACTIVITY PLAN  Choose an activity that you enjoy and set realistic goals. Your health care provider or diabetes educator can help you make an activity plan that works for you. You can break activities into 2 or 3 sessions throughout the day. Doing so is as good as one long session. Exercise ideas include:  Taking the dog for a walk.  Taking the stairs instead of the elevator.  Dancing to your favorite song.  Doing your favorite exercise with a friend. RECOMMENDATIONS FOR EXERCISING WITH TYPE 1 OR TYPE 2 DIABETES   Check your blood glucose before exercising. If blood glucose levels are greater than 240 mg/dL, check for urine ketones. Do not exercise if ketones are present.  Avoid injecting insulin into areas of the body that are going to be exercised. For example, avoid injecting insulin into:  The arms when playing tennis.  The legs when jogging.  Keep a record of:  Food intake before and after you exercise.  Expected peak times of insulin action.  Blood glucose levels before and after you exercise.  The type and amount of exercise you have done.  Review your records with your health care provider. Your health care provider will help you to  develop guidelines for adjusting food intake and insulin amounts before and after exercising.  If you take insulin or oral hypoglycemic agents, watch for signs and symptoms of hypoglycemia. They include:  Dizziness.  Shaking.  Sweating.  Chills.  Confusion.  Drink plenty of water while you exercise to prevent dehydration or heat stroke. Body water is lost during exercise and must be replaced.  Talk to your health care provider before starting an exercise program to make sure it is safe for you. Remember, almost any type of activity is better than none. Document Released: 07/13/2003 Document Revised: 12/23/2012 Document Reviewed: 09/29/2012 [facility] Patient Information 2014 [place], [place].

## 2013-05-05 LAB — BASIC METABOLIC PANEL WITH GFR
Calcium: 9.7 mg/dL (ref 8.4–10.5)
GFR, Est African American: >89 mL/min
GFR, Est Non African American: >89 mL/min
Glucose, Bld: 103 mg/dL — ABNORMAL HIGH (ref 70–99)
Potassium: 4.3 mEq/L (ref 3.5–5.3)
Sodium: 137 mEq/L (ref 135–145)

## 2013-05-05 LAB — LIPID PANEL
HDL: 40 mg/dL (ref >39)
Total CHOL/HDL Ratio: 4.1 Ratio

## 2013-05-05 LAB — INSULIN, FASTING: Insulin fasting, serum: 141 u[IU]/mL — ABNORMAL HIGH (ref 3–28)

## 2013-05-05 LAB — HEPATIC FUNCTION PANEL
ALT: 23 U/L (ref 0–53)
AST: 16 U/L (ref 0–37)
Albumin: 4.4 g/dL (ref 3.5–5.2)
Alkaline Phosphatase: 82 U/L (ref 39–117)
Indirect Bilirubin: 0.4 mg/dL (ref 0.0–0.9)
Total Bilirubin: 0.5 mg/dL (ref 0.3–1.2)

## 2013-07-15 ENCOUNTER — Other Ambulatory Visit: Payer: Self-pay | Admitting: Emergency Medicine

## 2013-07-28 ENCOUNTER — Encounter: Payer: Self-pay | Admitting: Internal Medicine

## 2013-07-28 DIAGNOSIS — Z1211 Encounter for screening for malignant neoplasm of colon: Secondary | ICD-10-CM | POA: Insufficient documentation

## 2013-07-28 DIAGNOSIS — Z79899 Other long term (current) drug therapy: Secondary | ICD-10-CM

## 2013-07-28 NOTE — Progress Notes (Signed)
Patient ID: Ryan Nguyen, male   DOB: November 03, 1965, 48 y.o.   MRN: [id number]  eror

## 2013-07-28 NOTE — Patient Instructions (Signed)

## 2013-08-02 ENCOUNTER — Encounter: Payer: Self-pay | Admitting: Internal Medicine

## 2013-08-12 ENCOUNTER — Other Ambulatory Visit: Payer: Self-pay | Admitting: Internal Medicine

## 2013-08-18 IMAGING — CR DG CHEST 2V
2 series · 2 of 2 positions shown · non-contrast
Comparison: 01/19/2004

CLINICAL DATA: Smoking history.  Cough.  Shortness of breath.

CHEST - 2 VIEW

[view not recorded (1 of 2)]
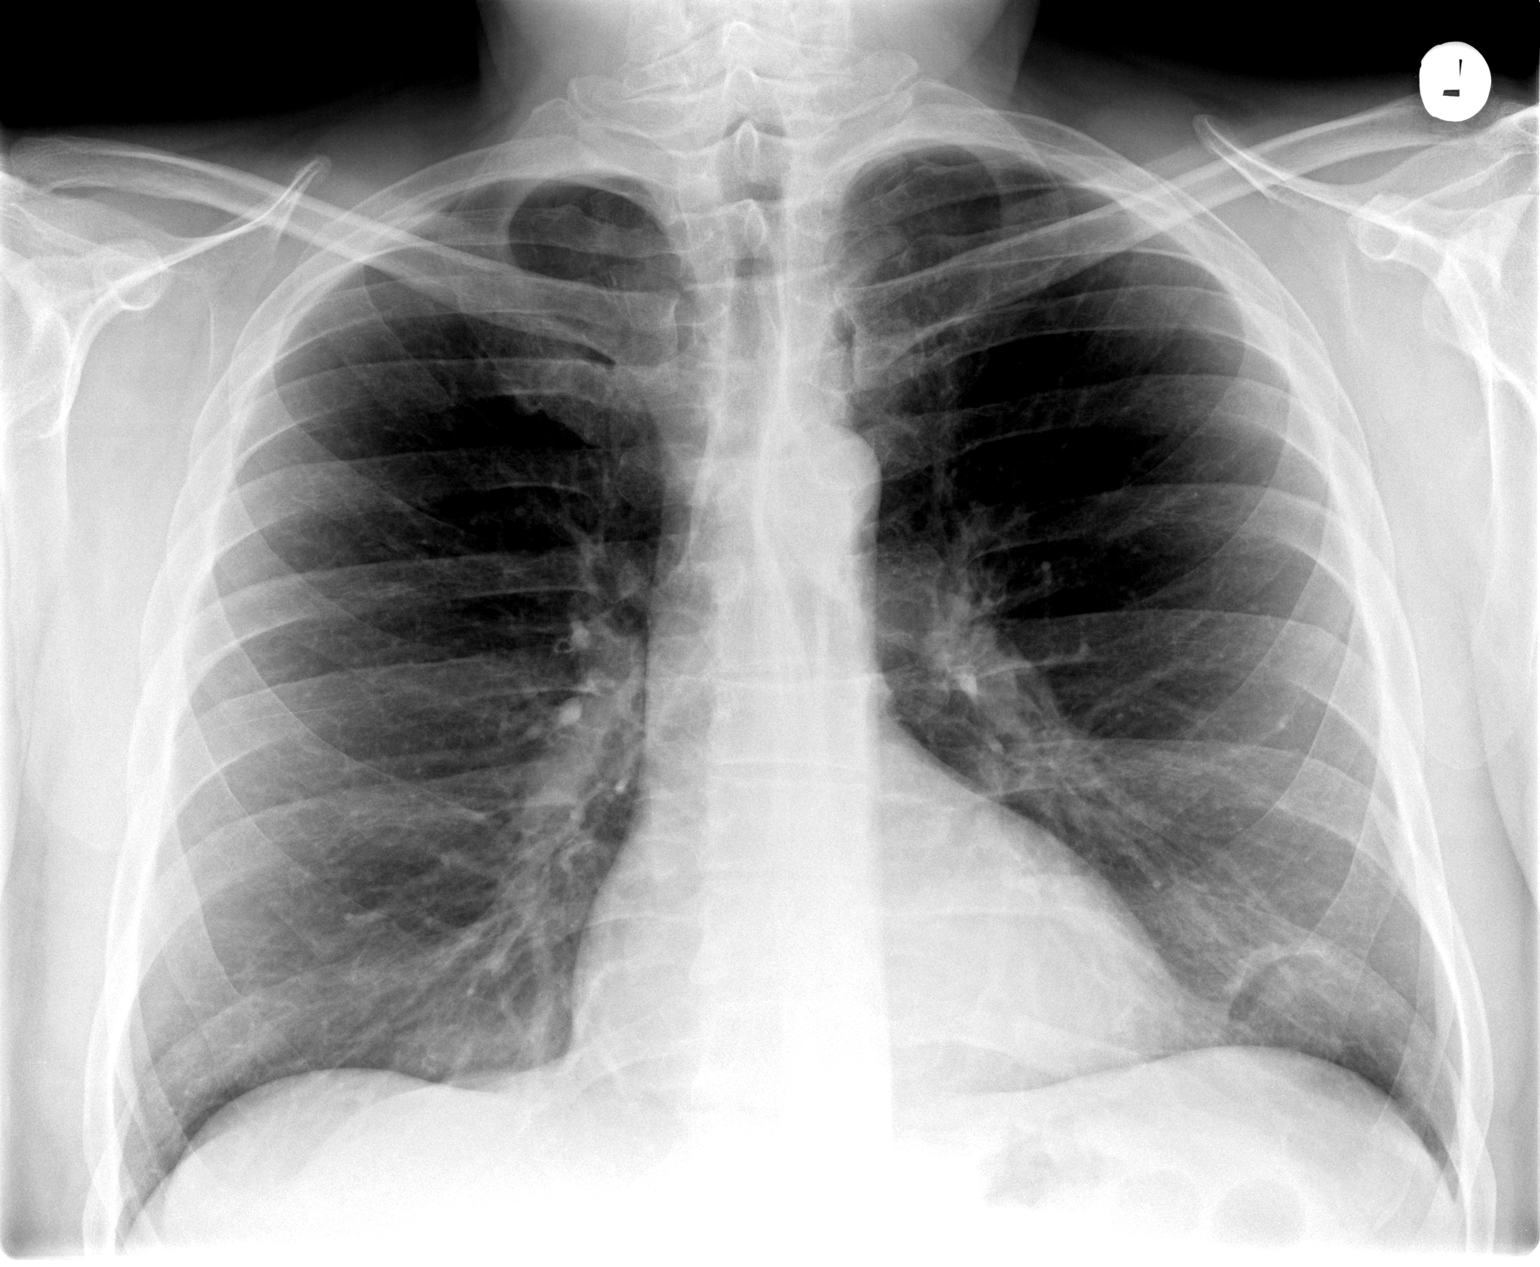

[view not recorded (2 of 2)]
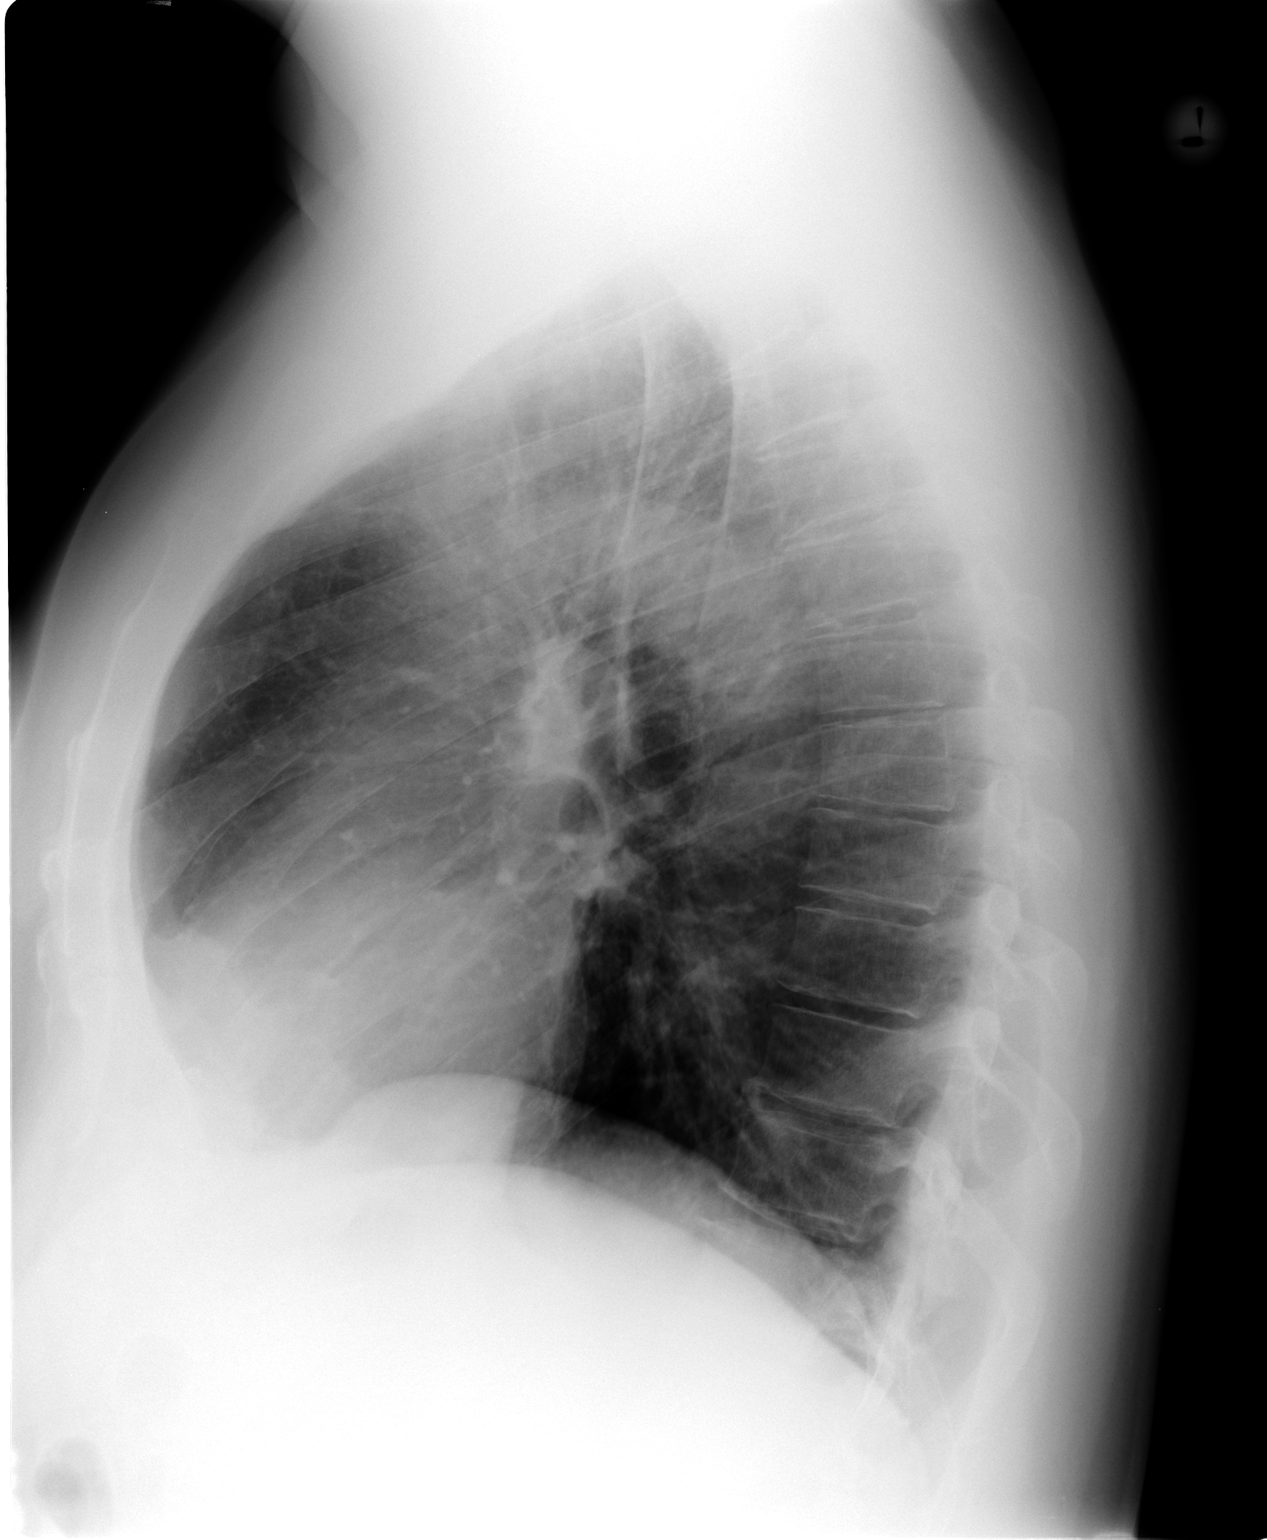

[2 of 2 positions shown; findings below may reference images not displayed]

FINDINGS: Heart size is normal.  Mediastinal shadows are normal.
There is mild atelectasis in the lingula.  Lungs are otherwise
clear.  No effusions.  No bony abnormalities.
IMPRESSION: Mild atelectasis in the lingula.  This could indicate minor
pneumonia.

## 2013-09-19 IMAGING — CR DG CHEST 2V
2 series · 2 of 2 positions shown · non-contrast
Comparison: May 08, 2012

CLINICAL DATA: Recent pneumonia; cough

CHEST - 2 VIEW

[view not recorded (1 of 2)]
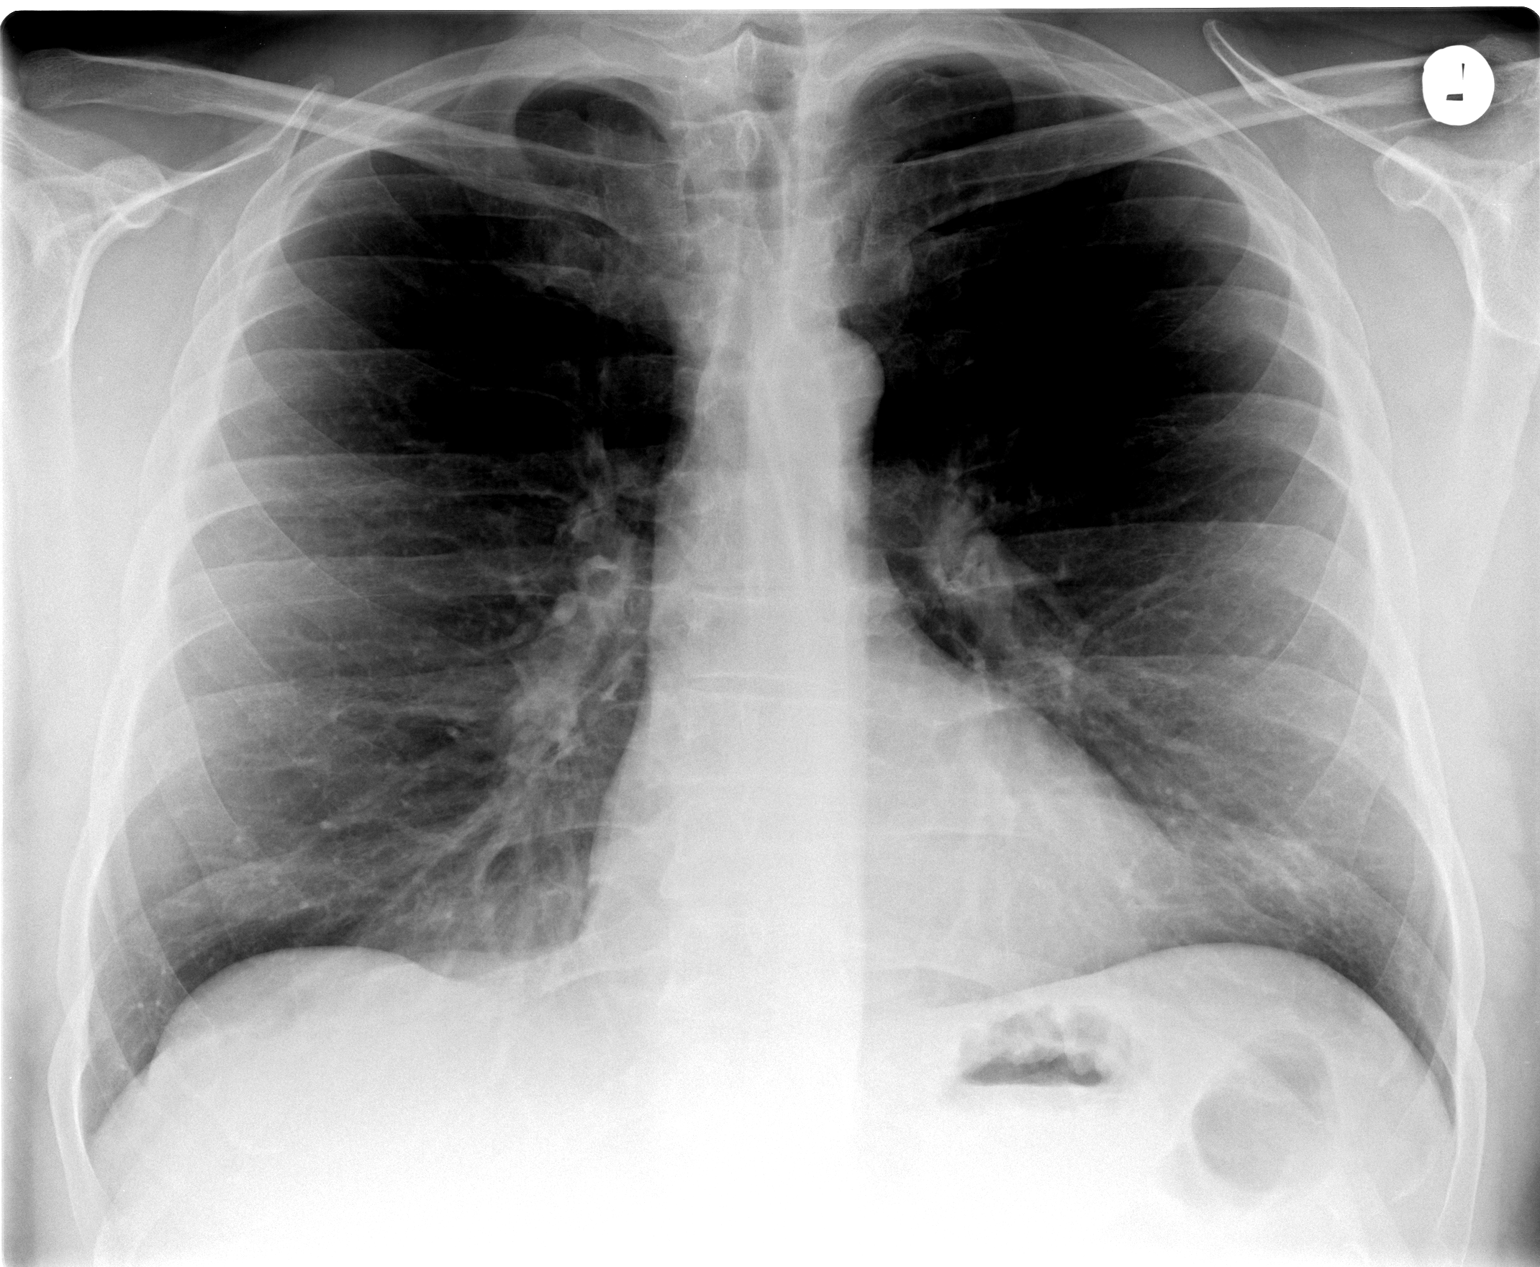

[view not recorded (2 of 2)]
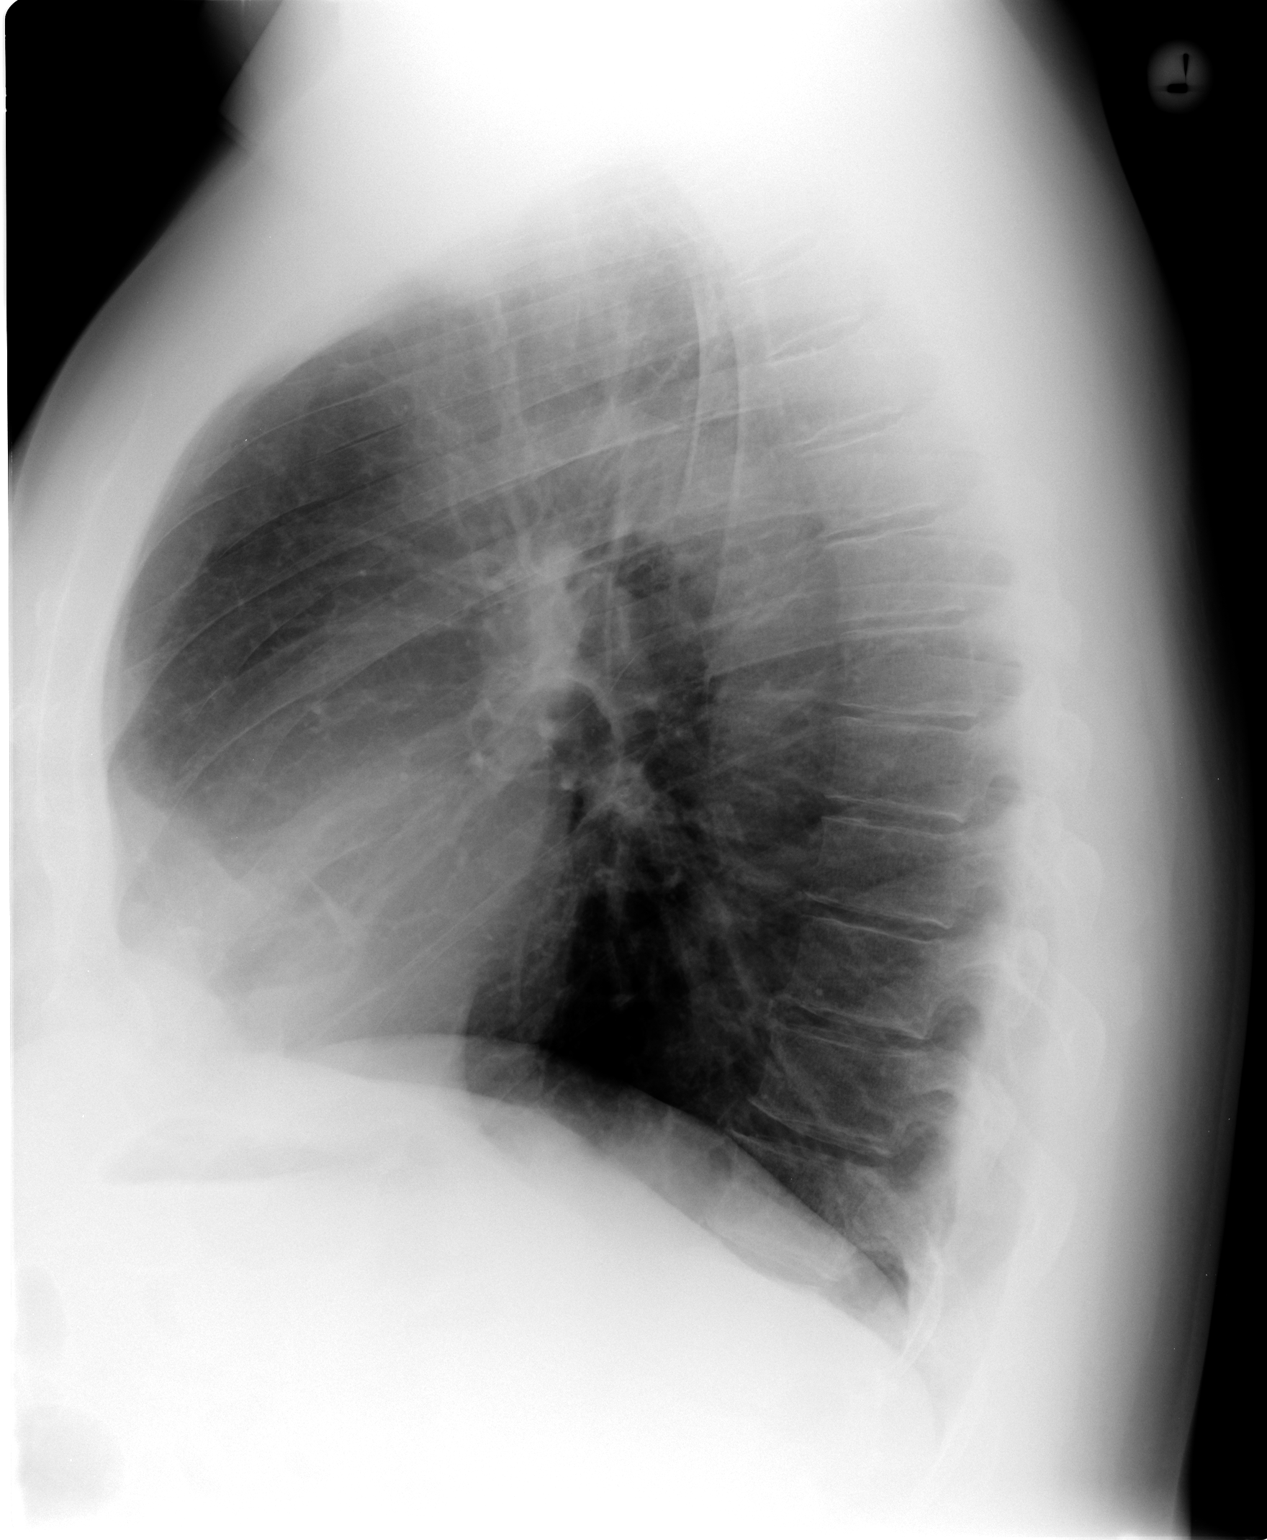

[2 of 2 positions shown; findings below may reference images not displayed]

FINDINGS: The previously noted atelectatic type change in the
lingula has resolved.  Currently, the lungs clear.  Heart size and
pulmonary vascularity are normal.  No adenopathy.  No bone lesions.
IMPRESSION: Lungs now clear.

## 2013-09-20 ENCOUNTER — Other Ambulatory Visit: Payer: Self-pay | Admitting: Internal Medicine

## 2013-11-08 ENCOUNTER — Encounter: Payer: Self-pay | Admitting: Emergency Medicine

## 2013-11-18 ENCOUNTER — Encounter: Payer: Self-pay | Admitting: Emergency Medicine

## 2013-11-18 ENCOUNTER — Ambulatory Visit (INDEPENDENT_AMBULATORY_CARE_PROVIDER_SITE_OTHER): Payer: 59 | Admitting: Emergency Medicine

## 2013-11-18 VITALS — BP 128/88 | HR 72 | Temp 98.2°F | Resp 18 | Ht 70.2 in | Wt 277.0 lb

## 2013-11-18 DIAGNOSIS — Z79899 Other long term (current) drug therapy: Secondary | ICD-10-CM

## 2013-11-18 DIAGNOSIS — E1110 Type 2 diabetes mellitus with ketoacidosis without coma: Secondary | ICD-10-CM

## 2013-11-18 DIAGNOSIS — I10 Essential (primary) hypertension: Secondary | ICD-10-CM

## 2013-11-18 DIAGNOSIS — Z111 Encounter for screening for respiratory tuberculosis: Secondary | ICD-10-CM

## 2013-11-18 DIAGNOSIS — E669 Obesity, unspecified: Secondary | ICD-10-CM

## 2013-11-18 DIAGNOSIS — Z0000 Encounter for general adult medical examination without abnormal findings: Secondary | ICD-10-CM

## 2013-11-18 DIAGNOSIS — E782 Mixed hyperlipidemia: Secondary | ICD-10-CM

## 2013-11-18 DIAGNOSIS — E119 Type 2 diabetes mellitus without complications: Secondary | ICD-10-CM

## 2013-11-18 DIAGNOSIS — R0683 Snoring: Secondary | ICD-10-CM

## 2013-11-18 DIAGNOSIS — Z125 Encounter for screening for malignant neoplasm of prostate: Secondary | ICD-10-CM

## 2013-11-18 DIAGNOSIS — E559 Vitamin D deficiency, unspecified: Secondary | ICD-10-CM

## 2013-11-18 LAB — CBC WITH DIFFERENTIAL/PLATELET
BASOS ABS: 0 10*3/uL (ref 0.0–0.1)
Basophils Relative: 0 % (ref 0–1)
EOS PCT: 2 % (ref 0–5)
Eosinophils Absolute: 0.2 10*3/uL (ref 0.0–0.7)
HEMATOCRIT: 42 % (ref 39.0–52.0)
Hemoglobin: 14.5 g/dL (ref 13.0–17.0)
LYMPHS ABS: 2.2 10*3/uL (ref 0.7–4.0)
LYMPHS PCT: 29 % (ref 12–46)
MCH: 30.7 pg (ref 26.0–34.0)
MCHC: 34.5 g/dL (ref 30.0–36.0)
MCV: 89 fL (ref 78.0–100.0)
MONOS PCT: 10 % (ref 3–12)
Monocytes Absolute: 0.8 10*3/uL (ref 0.1–1.0)
NEUTROS ABS: 4.5 10*3/uL (ref 1.7–7.7)
Neutrophils Relative %: 59 % (ref 43–77)
Platelets: 228 10*3/uL (ref 150–400)
RBC: 4.72 MIL/uL (ref 4.22–5.81)
RDW: 12.9 % (ref 11.5–15.5)
WBC: 7.6 10*3/uL (ref 4.0–10.5)

## 2013-11-18 LAB — HEMOGLOBIN A1C
Hgb A1c MFr Bld: 5.9 % — ABNORMAL HIGH (ref <5.7)
MEAN PLASMA GLUCOSE: 123 mg/dL — AB (ref <117)

## 2013-11-18 NOTE — Progress Notes (Signed)
Subjective:    Patient ID: Ryan Nguyen, male    DOB: 1965/06/14, 48 y.o.   MRN: [id number]  HPI Comments: 48 yo pleasant WM CPE and presents for 3 month F/U for HTN, Cholesterol, Pre-Dm, D. Deficient. He is staying active with work. He is not eating as healthy due to the busy work schedule. He is up 11 # since last CPE. He notes mild fatigue and + snoring per wife. He has declined testosterone in the past due to sensitivity.  He notes allergy drainage is on/off since stopping tobacco x 2 years.   WBC             8.2   05/04/2013 HGB            14.9   05/04/2013 HCT            43.0   05/04/2013 PLT             220   05/04/2013 GLUCOSE         103   05/04/2013 CHOL            162   05/04/2013 TRIG            142   05/04/2013 HDL              40   05/04/2013 LDLCALC          94   05/04/2013 ALT              23   05/04/2013 AST              16   05/04/2013 NA              137   05/04/2013 K               4.3   05/04/2013 CL              101   05/04/2013 CREATININE     0.74   05/04/2013 BUN              16   05/04/2013 CO2              28   05/04/2013 INR             1.1   02/15/2013 HGBA1C          6.0   05/04/2013  PSA .20   Hyperlipidemia  Hypertension     Medication List       This list is accurate as of: 11/18/13  9:34 AM.  Always use your most recent med list.               atenolol 100 MG tablet  Commonly known as:  TENORMIN  Take 100 mg by mouth daily.     atorvastatin 20 MG tablet  Commonly known as:  LIPITOR  Take 20 mg by mouth daily. 1/2 daily     citalopram 40 MG tablet  Commonly known as:  CELEXA  TAKE 1 TABLET BY MOUTH EVERY DAY FOR NERVES/STRESS     dicyclomine 20 MG tablet  Commonly known as:  BENTYL  TAKE 1 TABLET BY MOUTH 3 TIMES A DAY AS NEEDED FOR CRAMPING, BLOATING, OR NAUSEA     lisinopril 5 MG tablet  Commonly known as:  PRINIVIL,ZESTRIL  Take 1 tablet (5 mg total) by mouth daily.     metFORMIN 500 MG tablet  Commonly known  as:  GLUCOPHAGE  Take 500  mg by mouth 3 (three) times daily.     multivitamin with minerals tablet  Take 1 tablet by mouth daily.     OVER THE COUNTER MEDICATION  Take 1 tablet by mouth daily. Vitamin D 2  1.25 mg     ranitidine 300 MG tablet  Commonly known as:  ZANTAC  TAKE 1 TABLET TWICE A DAY       Allergies  Allergen Reactions  . Bee Venom Anaphylaxis  . Chantix [Varenicline]    Past Medical History  Diagnosis Date  . Hypertension   . Hypogonadism male   . Prediabetes   . Obese   . PND (post-nasal drip)   . SOB (shortness of breath) on exertion   . Chest pain   . Hyperlipidemia   . Anxiety   . Vitamin D deficiency   . Elevated hemoglobin A1c    Past Surgical History  Procedure Laterality Date  . Squamous cell carcinoma excision  2004  . Nasal ostea     History  Substance Use Topics  . Smoking status: Former Smoker    Quit date: 05/01/2012  . Smokeless tobacco: Not on file  . Alcohol Use: No   Family History  Problem Relation Age of Onset  . Hypertension Mother   . Diabetes Sister   . Cancer Father     Lung cancer  . COPD Mother    MAINTENANCE: Colonoscopy:due 2016 initial screen EYE:07/2013 WNL Dentist:q 6 month CXR:06/2012  IMMUNIZATIONS: Tdap:2013 Pneumovax:2014 Zostavax:n/a Influenza:2014  Patient Care Team: Unk Pinto, MD as PCP - General (Internal Medicine) Josue Hector, MD as Consulting Physician (Cardiology) Rozetta Nunnery, MD as Consulting Physician (Otolaryngology) [facility], (EYE) [place], (Dentist)  Review of Systems  All other systems reviewed and are negative.  BP 128/88  Pulse 72  Temp(Src) 98.2 F (36.8 C) (Temporal)  Resp 18  Ht 5' 10.2" (1.783 m)  Wt 277 lb (125.646 kg)  BMI 39.52 kg/m2     Objective:   Physical Exam  Nursing note and vitals reviewed. Constitutional: He is oriented to person, place, and time. He appears well-developed and well-nourished.  obese  HENT:  Head:  Normocephalic and atraumatic.  Right Ear: External ear normal.  Left Ear: External ear normal.  Nose: Nose normal.  Cloudy TM's bilaterally, large tongue obstructs airway  Eyes: Conjunctivae and EOM are normal. Pupils are equal, round, and reactive to light. Right eye exhibits no discharge. Left eye exhibits no discharge. No scleral icterus.  Neck: Normal range of motion. Neck supple. No JVD present. No tracheal deviation present. No thyromegaly present.  Cardiovascular: Normal rate, regular rhythm, normal heart sounds and intact distal pulses.   Pulmonary/Chest: Effort normal and breath sounds normal.  Abdominal: Soft. Bowel sounds are normal. He exhibits no distension and no mass. There is no tenderness. There is no rebound and no guarding.  Genitourinary: Rectum normal. Guaiac negative stool.  Musculoskeletal: Normal range of motion. He exhibits no edema and no tenderness.  Lymphadenopathy:    He has no cervical adenopathy.  Neurological: He is alert and oriented to person, place, and time. He has normal reflexes. No cranial nerve deficit. He exhibits normal muscle tone. Coordination normal.  Skin: Skin is warm and dry. No rash noted. No erythema. No pallor.  Mid chest scaling 4 mm erythematous area  Psychiatric: He has a normal mood and affect. His behavior is normal. Judgment and thought content normal.      EKG NSCSPT WNL     Assessment &  Plan:  1. CPE- Update screening labs/ History/ Immunizations/ Testing as needed. Advised healthy diet, QD exercise, increase H20 and continue RX/ Vitamins AD.  2. 3 month F/U for HTN, Cholesterol,DM, D. Deficient. Needs healthy diet, cardio QD and obtain healthy weight. Check Labs, Check BP if >130/80 call office, Check BS if >200 call office   3. Snoring, Obesity- Continue weight loss, increase activity and better diet. Pt aware of risks. Check labs - Get sleep study  4.  Irreg Nevi- monitor for any change, call if occurs for removal   5.  Allergic rhinitis- Flonase OTC, increase H2o, allergy hygiene explained.

## 2013-11-18 NOTE — Patient Instructions (Signed)
Tuberculin Skin Test The PPD skin test is a method used to help with the diagnosis of a disease called tuberculosis (TB). HOW THE TEST IS DONE  The test site (usually the forearm) is cleansed. The PPD extract is then injected under the top layer of skin, causing a blister to form on the skin. The reaction will take 48 - 72 hours to develop. You must return to your health care provider within that time to have the area checked. This will determine whether you have had a significant reaction to the PPD test. A reaction is measured in millimeters of hard swelling (induration) at the site. PREPARATION FOR TEST  There is no special preparation for this test. People with a skin rash or other skin irritations on their arms may need to have the test performed at a different spot on the body. Tell your health care provider if you have ever had a positive PPD skin test. If so, you should not have a repeat PPD test. Tell your doctor if you have a medical condition or if you take certain drugs, such as steroids, that can affect your immune system. These situations may lead to inaccurate test results. NORMAL FINDINGS A negative reaction (no induration) or a level of hard swelling that falls below a certain cutoff may mean that a person has not been infected with the bacteria that cause TB. There are different cutoffs for children, people with HIV, and other risk groups. Unfortunately, this is not a perfect test, and up to 20% of people infected with tuberculosis may not have a reaction on the PPD skin test. In addition, certain conditions that affect the immune system (cancer, recent chemotherapy, late-stage AIDS) may cause a false-negative test result.  The reaction will take 48 - 72 hours to develop. You must return to your health care provider within that time to have the area checked. Follow your caregiver's instructions as to where and when to report for this to be done. Ranges for normal findings may vary  among different laboratories and hospitals. You should always check with your doctor after having lab work or other tests done to discuss the meaning of your test results and whether your values are considered within normal limits. WHAT ABNORMAL RESULTS MEAN  The results of the test depend on the size of the skin reaction and on the person being tested.  A small reaction (5 mm of hard swelling at the site) is considered to be positive in people who have HIV, who are taking steroid therapy, or who have been in close contact with a person who has active tuberculosis. Larger reactions (greater than or equal to 10 mm) are considered positive in people with diabetes or kidney failure, and in health care workers, among others. In people with no known risks for tuberculosis, a positive reaction requires 15 mm or more of hard swelling at the site. RISKS AND COMPLICATIONS There is a very small risk of severe redness and swelling of the arm in people who have had a previous positive PPD test and who have the test again. There also have been a few rare cases of this reaction in people who have not been tested before. CONSIDERATIONS  A positive skin test does not necessarily mean that a person has active tuberculosis. More tests will be done to check whether active disease is present. Many people who were born outside the [place] may have had a vaccine called "BCG," which can lead to a false-positive test   result. MEANING OF TEST  Your caregiver will go over the test results with you and discuss the importance and meaning of your results, as well as treatment options and the need for additional tests if necessary. OBTAINING THE TEST RESULTS It is your responsibility to obtain your test results. Ask the lab or department performing the test when and how you will get your results. Document Released: 01/30/2005 Document Revised: 07/15/2011 Document Reviewed: 04/03/2008 [facility] Patient Information 2015  [place], [place]. This information is not intended to replace advice given to you by your health care provider. Make sure you discuss any questions you have with your health care provider.  We want weight loss that will last so you should lose 1-2 pounds a week.  THAT IS IT! Please pick THREE things a month to change. Once it is a habit check off the item. Then pick another three items off the list to become habits.  If you are already doing a habit on the list GREAT!  Cross that item off! o Don't drink your calories. Ie, alcohol, soda, fruit juice, and sweet tea.  o Drink more water. Drink a glass when you feel hungry or before each meal.  o Eat breakfast - Complex carb and protein (likeDannon light and fit yogurt, oatmeal, fruit, eggs, [place] bacon). o Measure your cereal.  Eat no more than one cup a day. (ie [place]) o Eat an apple a day. o Add a vegetable a day. o Try a new vegetable a month. o Use Pam! Stop using oil or butter to cook. o Don't finish your plate or use smaller plates. o Share your dessert. o Eat sugar free Jello for dessert or frozen grapes. o Don't eat 2-3 hours before bed. o Switch to whole wheat bread, pasta, and brown rice. o Make healthier choices when you eat out. No fries! o Pick baked chicken, NOT fried. o Don't forget to SLOW DOWN when you eat. It is not going anywhere.  o Take the stairs. o Park far away in the parking lot o [employer] (or weights) for 10 minutes while watching TV. o Walk at work for 10 minutes during break. o Walk outside 1 time a week with your friend, kids, dog, or significant other. o Start a walking group at [place] the mall as much as you can tolerate.  o Keep a food diary. o Weigh yourself daily. o Walk for 15 minutes 3 days per week. o Cook at home more often and eat out less.  If life happens and you go back to old habits, it is okay.  Just start over. You can do it!   If you experience chest pain, get short of breath, or  tired during the exercise, please stop immediately and inform your doctor.      Bad carbs also include fruit juice, alcohol, and sweet tea. These are empty calories that do not signal to your brain that you are full.   Please remember the good carbs are still carbs which convert into sugar. So please measure them out no more than 1/2-1 cup of rice, oatmeal, pasta, and beans.  Veggies are however free foods! Pile them on.   I like lean protein at every meal such as chicken, [place], pork chops, cottage cheese, etc. Just do not fry these meats and please center your meal around vegetable, the meats should be a side dish.   No all fruit is created equal. Please see the list below, the  fruit at the bottom is higher in sugars than the fruit at the top

## 2013-11-19 LAB — URINALYSIS, ROUTINE W REFLEX MICROSCOPIC
Bilirubin Urine: NEGATIVE
GLUCOSE, UA: NEGATIVE mg/dL
Hgb urine dipstick: NEGATIVE
KETONES UR: NEGATIVE mg/dL
Leukocytes, UA: NEGATIVE
Nitrite: NEGATIVE
Protein, ur: NEGATIVE mg/dL
Specific Gravity, Urine: 1.026 (ref 1.005–1.030)
Urobilinogen, UA: 0.2 mg/dL (ref 0.0–1.0)
pH: 5 (ref 5.0–8.0)

## 2013-11-19 LAB — PSA: PSA: 0.17 ng/mL (ref <=4.00)

## 2013-11-19 LAB — BASIC METABOLIC PANEL WITH GFR
BUN: 18 mg/dL (ref 6–23)
CHLORIDE: 102 meq/L (ref 96–112)
CO2: 23 mEq/L (ref 19–32)
CREATININE: 0.9 mg/dL (ref 0.50–1.35)
Calcium: 9.4 mg/dL (ref 8.4–10.5)
GFR, Est African American: >89 mL/min
GLUCOSE: 97 mg/dL (ref 70–99)
Potassium: 4.3 mEq/L (ref 3.5–5.3)
Sodium: 138 mEq/L (ref 135–145)

## 2013-11-19 LAB — HEPATIC FUNCTION PANEL
ALBUMIN: 4.6 g/dL (ref 3.5–5.2)
ALT: 23 U/L (ref 0–53)
AST: 18 U/L (ref 0–37)
Alkaline Phosphatase: 76 U/L (ref 39–117)
BILIRUBIN DIRECT: 0.1 mg/dL (ref 0.0–0.3)
BILIRUBIN TOTAL: 0.7 mg/dL (ref 0.2–1.2)
Indirect Bilirubin: 0.6 mg/dL (ref 0.2–1.2)
Total Protein: 7.1 g/dL (ref 6.0–8.3)

## 2013-11-19 LAB — INSULIN, FASTING: Insulin fasting, serum: 44 u[IU]/mL — ABNORMAL HIGH (ref 3–28)

## 2013-11-19 LAB — MICROALBUMIN / CREATININE URINE RATIO
Creatinine, Urine: 244.5 mg/dL
MICROALB UR: 1.33 mg/dL (ref 0.00–1.89)
Microalb Creat Ratio: 5.4 mg/g (ref 0.0–30.0)

## 2013-11-19 LAB — LIPID PANEL
Cholesterol: 161 mg/dL (ref 0–200)
HDL: 41 mg/dL (ref >39)
LDL Cholesterol: 102 mg/dL — ABNORMAL HIGH (ref 0–99)
TRIGLYCERIDES: 89 mg/dL (ref <150)
Total CHOL/HDL Ratio: 3.9 Ratio
VLDL: 18 mg/dL (ref 0–40)

## 2013-11-19 LAB — MAGNESIUM: MAGNESIUM: 2 mg/dL (ref 1.5–2.5)

## 2013-11-19 LAB — VITAMIN D 25 HYDROXY (VIT D DEFICIENCY, FRACTURES): VIT D 25 HYDROXY: 39 ng/mL (ref 30–89)

## 2013-11-19 LAB — TESTOSTERONE: TESTOSTERONE: 228 ng/dL — AB (ref 300–890)

## 2013-11-19 LAB — TSH: TSH: 2.004 u[IU]/mL (ref 0.350–4.500)

## 2013-11-22 ENCOUNTER — Encounter: Payer: Self-pay | Admitting: *Deleted

## 2013-11-22 LAB — TB SKIN TEST
Induration: 0 mm
TB Skin Test: NEGATIVE

## 2014-01-17 ENCOUNTER — Other Ambulatory Visit: Payer: Self-pay | Admitting: Emergency Medicine

## 2014-02-01 ENCOUNTER — Ambulatory Visit (HOSPITAL_BASED_OUTPATIENT_CLINIC_OR_DEPARTMENT_OTHER): Payer: BC Managed Care – PPO

## 2014-03-07 ENCOUNTER — Other Ambulatory Visit: Payer: Self-pay | Admitting: Cardiovascular Disease

## 2014-03-11 ENCOUNTER — Encounter: Payer: Self-pay | Admitting: Internal Medicine

## 2014-03-11 ENCOUNTER — Ambulatory Visit (INDEPENDENT_AMBULATORY_CARE_PROVIDER_SITE_OTHER): Payer: 59 | Admitting: Internal Medicine

## 2014-03-11 VITALS — BP 130/84 | HR 76 | Temp 97.9°F | Resp 16 | Ht 70.5 in | Wt 283.2 lb

## 2014-03-11 DIAGNOSIS — E291 Testicular hypofunction: Secondary | ICD-10-CM

## 2014-03-11 DIAGNOSIS — E785 Hyperlipidemia, unspecified: Secondary | ICD-10-CM

## 2014-03-11 DIAGNOSIS — E1310 Other specified diabetes mellitus with ketoacidosis without coma: Secondary | ICD-10-CM

## 2014-03-11 DIAGNOSIS — E1110 Type 2 diabetes mellitus with ketoacidosis without coma: Secondary | ICD-10-CM

## 2014-03-11 DIAGNOSIS — E559 Vitamin D deficiency, unspecified: Secondary | ICD-10-CM

## 2014-03-11 DIAGNOSIS — Z79899 Other long term (current) drug therapy: Secondary | ICD-10-CM

## 2014-03-11 DIAGNOSIS — Z23 Encounter for immunization: Secondary | ICD-10-CM

## 2014-03-11 DIAGNOSIS — I10 Essential (primary) hypertension: Secondary | ICD-10-CM

## 2014-03-11 LAB — CBC WITH DIFFERENTIAL/PLATELET
BASOS ABS: 0 10*3/uL (ref 0.0–0.1)
Basophils Relative: 0 % (ref 0–1)
EOS PCT: 3 % (ref 0–5)
Eosinophils Absolute: 0.2 10*3/uL (ref 0.0–0.7)
HCT: 39.2 % (ref 39.0–52.0)
Hemoglobin: 13.5 g/dL (ref 13.0–17.0)
Lymphocytes Relative: 29 % (ref 12–46)
Lymphs Abs: 2 10*3/uL (ref 0.7–4.0)
MCH: 31.1 pg (ref 26.0–34.0)
MCHC: 34.4 g/dL (ref 30.0–36.0)
MCV: 90.3 fL (ref 78.0–100.0)
Monocytes Absolute: 0.6 10*3/uL (ref 0.1–1.0)
Monocytes Relative: 9 % (ref 3–12)
Neutro Abs: 4.1 10*3/uL (ref 1.7–7.7)
Neutrophils Relative %: 59 % (ref 43–77)
PLATELETS: 174 10*3/uL (ref 150–400)
RBC: 4.34 MIL/uL (ref 4.22–5.81)
RDW: 13 % (ref 11.5–15.5)
WBC: 7 10*3/uL (ref 4.0–10.5)

## 2014-03-11 NOTE — Progress Notes (Signed)
Patient ID: Ryan Nguyen, male   DOB: [birth year], 48 y.o.   MRN: [id number]   This very nice 48 y.o.MWM presents for 3 month follow up with Hypertension, Hyperlipidemia, T2_NIDDM, Testosterone and Vitamin D Deficiency.    Patient is treated for HTN & BP has been controlled at home. Today's BP: 130/84 mmHg. Patient has had no complaints of any cardiac type chest pain, palpitations, dyspnea/orthopnea/PND, dizziness, claudication, or dependent edema.   Hyperlipidemia is controlled with diet & meds. Patient denies myalgias or other med SE's. Last Lipids were at goal - Total Chol 161; HDL  41; LDL  102*; Trig 89 on 11/18/2013.   Also, the patient has history of Morbid Obesity and consequent T2_NIDDM and has had no symptoms of reactive hypoglycemia, diabetic polys, paresthesias or visual blurring.  Last A1c was 5.9% on 11/18/2013.   Patient has Low T and has deferred therapy. Further, the patient also has history of Vitamin D Deficiency and supplements vitamin D without any suspected side-effects. Last vitamin D was 39 on 11/18/2013.   Medication List   atorvastatin 20 MG tablet  Commonly known as:  LIPITOR  Take 20 mg by mouth daily. 1/2 daily     citalopram 40 MG tablet  Commonly known as:  CELEXA  TAKE 1 TABLET BY MOUTH EVERY DAY FOR NERVES/STRESS     dicyclomine 20 MG tablet  Commonly known as:  BENTYL  TAKE 1 TABLET BY MOUTH 3 TIMES A DAY AS NEEDED FOR CRAMPING, BLOATING, OR NAUSEA     lisinopril 5 MG tablet  Commonly known as:  PRINIVIL,ZESTRIL  TAKE 1 TABLET BY MOUTH DAILY.     metFORMIN 500 MG tablet  Commonly known as:  GLUCOPHAGE  Take 500 mg by mouth 3 (three) times daily.     multivitamin with minerals tablet  Take 1 tablet by mouth daily.     OVER THE COUNTER MEDICATION  Take 1 tablet by mouth daily. Vitamin D 2  1.25 mg     ranitidine 300 MG tablet  Commonly known as:  ZANTAC  TAKE 1 TABLET TWICE A DAY     Allergies  Allergen Reactions  . Bee Venom Anaphylaxis  .  Chantix [Varenicline]    PMHx:   Past Medical History  Diagnosis Date  . Hypertension   . Hypogonadism male   . Prediabetes   . Obese   . PND (post-nasal drip)   . SOB (shortness of breath) on exertion   . Chest pain   . Hyperlipidemia   . Anxiety   . Vitamin D deficiency   . Elevated hemoglobin A1c    Immunization History  Administered Date(s) Administered  . Influenza Split 02/01/2013, 03/11/2014  . PPD Test 11/18/2013  . Pneumococcal Polysaccharide-23 11/05/2012  . Pneumococcal-Unspecified 05/06/1998  . Td 05/06/2001  . Tdap 05/05/2012   Past Surgical History  Procedure Laterality Date  . Squamous cell carcinoma excision  2004  . Nasal ostea     FHx:    Reviewed / unchanged  SHx:    Reviewed / unchanged  Systems Review:  Constitutional: Denies fever, chills, wt changes, headaches, insomnia, fatigue, night sweats, change in appetite. Eyes: Denies redness, blurred vision, diplopia, discharge, itchy, watery eyes.  ENT: Denies discharge, congestion, post nasal drip, epistaxis, sore throat, earache, hearing loss, dental pain, tinnitus, vertigo, sinus pain, snoring.  CV: Denies chest pain, palpitations, irregular heartbeat, syncope, dyspnea, diaphoresis, orthopnea, PND, claudication or edema. Respiratory: denies cough, dyspnea, DOE, pleurisy, hoarseness, laryngitis, wheezing.  Gastrointestinal: Denies  dysphagia, odynophagia, heartburn, reflux, water brash, abdominal pain or cramps, nausea, vomiting, bloating, diarrhea, constipation, hematemesis, melena, hematochezia  or hemorrhoids. Genitourinary: Denies dysuria, frequency, urgency, nocturia, hesitancy, discharge, hematuria or flank pain. Musculoskeletal: Denies arthralgias, myalgias, stiffness, jt. swelling, pain, limping or strain/sprain.  Skin: Denies pruritus, rash, hives, warts, acne, eczema or change in skin lesion(s). Neuro: No weakness, tremor, incoordination, spasms, paresthesia or pain. Psychiatric: Denies  confusion, memory loss or sensory loss. Endo: Denies change in weight, skin or hair change.  Heme/Lymph: No excessive bleeding, bruising or enlarged lymph nodes.  Exam:  BP 130/84   Pulse 76  Temp 97.9 F  Resp 16  Ht 5' 10.5"  Wt 283 lb 3.2 oz  BMI 40.05   Appears well nourished and in no distress. Eyes: PERRLA, EOMs, conjunctiva no swelling or erythema. Sinuses: No frontal/maxillary tenderness ENT/Mouth: EAC's clear, TM's nl w/o erythema, bulging. Nares clear w/o erythema, swelling, exudates. Oropharynx clear without erythema or exudates. Oral hygiene is good. Tongue normal, non obstructing. Hearing intact.  Neck: Supple. Thyroid nl. Car 2+/2+ without bruits, nodes or JVD. Chest: Respirations nl with BS clear & equal w/o rales, rhonchi, wheezing or stridor.  Cor: Heart sounds normal w/ regular rate and rhythm without sig. murmurs, gallops, clicks, or rubs. Peripheral pulses normal and equal  without edema.  Abdomen: Soft & bowel sounds normal. Non-tender w/o guarding, rebound, hernias, masses, or organomegaly.  Lymphatics: Unremarkable.  Musculoskeletal: Full ROM all peripheral extremities, joint stability, 5/5 strength, and normal gait.  Skin: Warm, dry without exposed rashes, lesions or ecchymosis apparent.  Neuro: Cranial nerves intact, reflexes equal bilaterally. Sensory-motor testing grossly intact. Tendon reflexes grossly intact.  Pysch: Alert & oriented x 3.  Insight and judgement nl & appropriate. No ideations.  Assessment and Plan:  1. Hypertension - Continue monitor blood pressure at home. Continue diet/meds same.  2. Hyperlipidemia - Continue diet/meds, exercise,& lifestyle modifications. Continue monitor periodic cholesterol/liver & renal functions   3. T2_NIDDM - Continue diet, exercise, lifestyle modifications. Monitor appropriate labs.  4. Vitamin D Deficiency - Continue supplementation.  5. Testosterone Deficiency -  .   Recommended regular exercise, BP  monitoring, weight control, and discussed med and SE's. Recommended labs to assess and monitor clinical status. Further disposition pending results of labs.

## 2014-03-11 NOTE — Patient Instructions (Signed)

## 2014-03-12 LAB — BASIC METABOLIC PANEL WITH GFR
BUN: 17 mg/dL (ref 6–23)
CALCIUM: 8.9 mg/dL (ref 8.4–10.5)
CHLORIDE: 105 meq/L (ref 96–112)
CO2: 25 mEq/L (ref 19–32)
CREATININE: 0.87 mg/dL (ref 0.50–1.35)
GFR, Est African American: >89 mL/min
Glucose, Bld: 129 mg/dL — ABNORMAL HIGH (ref 70–99)
Potassium: 4 mEq/L (ref 3.5–5.3)
Sodium: 138 mEq/L (ref 135–145)

## 2014-03-12 LAB — HEPATIC FUNCTION PANEL
ALBUMIN: 4 g/dL (ref 3.5–5.2)
ALK PHOS: 74 U/L (ref 39–117)
ALT: 18 U/L (ref 0–53)
AST: 14 U/L (ref 0–37)
BILIRUBIN TOTAL: 0.5 mg/dL (ref 0.2–1.2)
Bilirubin, Direct: 0.1 mg/dL (ref 0.0–0.3)
Indirect Bilirubin: 0.4 mg/dL (ref 0.2–1.2)
TOTAL PROTEIN: 6.6 g/dL (ref 6.0–8.3)

## 2014-03-12 LAB — TSH: TSH: 1.993 u[IU]/mL (ref 0.350–4.500)

## 2014-03-12 LAB — LIPID PANEL
Cholesterol: 170 mg/dL (ref 0–200)
HDL: 35 mg/dL — ABNORMAL LOW (ref >39)
LDL Cholesterol: 92 mg/dL (ref 0–99)
Total CHOL/HDL Ratio: 4.9 Ratio
Triglycerides: 214 mg/dL — ABNORMAL HIGH (ref <150)
VLDL: 43 mg/dL — ABNORMAL HIGH (ref 0–40)

## 2014-03-12 LAB — TESTOSTERONE: Testosterone: 201 ng/dL — ABNORMAL LOW (ref 300–890)

## 2014-03-12 LAB — VITAMIN D 25 HYDROXY (VIT D DEFICIENCY, FRACTURES): VIT D 25 HYDROXY: 22 ng/mL — AB (ref 30–89)

## 2014-03-12 LAB — MAGNESIUM: Magnesium: 1.9 mg/dL (ref 1.5–2.5)

## 2014-03-12 LAB — INSULIN, FASTING: INSULIN FASTING, SERUM: 224.6 u[IU]/mL — AB (ref 2.0–19.6)

## 2014-03-12 LAB — HEMOGLOBIN A1C
Hgb A1c MFr Bld: 6.2 % — ABNORMAL HIGH (ref <5.7)
Mean Plasma Glucose: 131 mg/dL — ABNORMAL HIGH (ref <117)

## 2014-03-13 ENCOUNTER — Encounter: Payer: Self-pay | Admitting: Internal Medicine

## 2014-04-14 ENCOUNTER — Encounter (HOSPITAL_COMMUNITY): Payer: Self-pay | Admitting: Cardiovascular Disease

## 2014-04-24 ENCOUNTER — Other Ambulatory Visit: Payer: Self-pay | Admitting: Internal Medicine

## 2014-05-01 NOTE — Progress Notes (Signed)
Patient ID: Maxx Pham, male   DOB: 29-Jan-1966, 48 y.o.   MRN: [id number] 48 yo referred for dyspnea and chest pain. No documented CAD CRF;s HTN elevated lipids and and recent diagnosis of diabetes. Issue is severe weight gain over the last 10 years. This has caused DM, reflux and dyspnea. He smoked 2ppd since age 60 and quit in January. Diet is bad with lots of carbs and mild shakes Sedentary and does not exercies 2 episodes of pain last 6 months Both after breakfast with GI overtones and not taking his reflux meds. No PND orthopnea Mild dependant edema. Does not carry diagnosis of COPD but long time smoker No cough or sputum Likely OSA. 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  LV Ejection Fraction: 51%. LV Wall Motion: Akinesis of the inferior lateral wall.  Cath 02/19/13 with no significant Epicardial CAD and normal EF      ROS: Denies fever, malais, weight loss, blurry vision, decreased visual acuity, cough, sputum, SOB, hemoptysis, pleuritic pain, palpitaitons, heartburn, abdominal pain, melena, lower extremity edema, claudication, or rash.  All other systems reviewed and negative  General: Affect appropriate Healthy:  appears stated age 69: normal Neck supple with no adenopathy JVP normal no bruits no thyromegaly Lungs clear with no wheezing and good diaphragmatic motion Heart:  S1/S2 no murmur, no rub, gallop or click PMI normal Abdomen: benighn, BS positve, no tenderness, no AAA no bruit.  No HSM or HJR Distal pulses intact with no bruits No edema Neuro non-focal Skin warm and dry No muscular weakness   Current Outpatient Prescriptions  Medication Sig Dispense Refill  . atenolol (TENORMIN) 100 MG tablet Take 100 mg by mouth daily.    Marland Kitchen atorvastatin (LIPITOR) 20 MG tablet TAKE  1 TABLET BY MOUTH EVERY DAY 90 tablet 0  . citalopram (CELEXA) 40 MG tablet TAKE 1 TABLET BY MOUTH EVERY DAY FOR NERVES/STRESS 90 tablet 3  . dicyclomine (BENTYL) 20 MG tablet TAKE 1 TABLET BY MOUTH 3 TIMES A DAY AS NEEDED FOR CRAMPING, BLOATING, OR NAUSEA 90 tablet 3  . lisinopril (PRINIVIL,ZESTRIL) 5 MG tablet TAKE 1 TABLET BY MOUTH DAILY. 90 tablet 0  . metFORMIN (GLUCOPHAGE) 500 MG tablet Take 500 mg by mouth 3 (three) times daily.     . Multiple Vitamins-Minerals (MULTIVITAMIN WITH MINERALS) tablet Take 1 tablet by mouth daily.    Marland Kitchen OVER THE COUNTER MEDICATION Take 1 tablet by mouth daily. Vitamin D 2  1.25 mg    . ranitidine (ZANTAC) 300 MG tablet TAKE 1 TABLET TWICE A DAY 180 tablet 2   No current facility-administered medications for this visit.    Allergies  Bee venom and Chantix  Electrocardiogram:  02/23/13 SR RATE 58 LAD [id number]   Assessment and Plan

## 2014-05-03 ENCOUNTER — Encounter: Payer: BC Managed Care – PPO | Admitting: Cardiovascular Disease

## 2014-05-09 ENCOUNTER — Other Ambulatory Visit: Payer: Self-pay | Admitting: Internal Medicine

## 2014-06-16 ENCOUNTER — Ambulatory Visit (INDEPENDENT_AMBULATORY_CARE_PROVIDER_SITE_OTHER): Payer: 59 | Admitting: Internal Medicine

## 2014-06-16 DIAGNOSIS — R69 Illness, unspecified: Secondary | ICD-10-CM

## 2014-06-16 NOTE — Progress Notes (Signed)
Patient ID: Ryan Nguyen, male   DOB: [birth year], 49 y.o.   MRN: [id number]  Ryan Nguyen

## 2014-07-05 ENCOUNTER — Other Ambulatory Visit: Payer: Self-pay | Admitting: Cardiovascular Disease

## 2014-07-18 ENCOUNTER — Ambulatory Visit (INDEPENDENT_AMBULATORY_CARE_PROVIDER_SITE_OTHER): Payer: 59 | Admitting: Internal Medicine

## 2014-07-18 ENCOUNTER — Encounter: Payer: Self-pay | Admitting: Internal Medicine

## 2014-07-18 VITALS — BP 130/76 | HR 92 | Temp 98.1°F | Resp 16 | Ht 70.0 in | Wt 283.8 lb

## 2014-07-18 DIAGNOSIS — E785 Hyperlipidemia, unspecified: Secondary | ICD-10-CM

## 2014-07-18 DIAGNOSIS — E1310 Other specified diabetes mellitus with ketoacidosis without coma: Secondary | ICD-10-CM

## 2014-07-18 DIAGNOSIS — E559 Vitamin D deficiency, unspecified: Secondary | ICD-10-CM

## 2014-07-18 DIAGNOSIS — Z0001 Encounter for general adult medical examination with abnormal findings: Secondary | ICD-10-CM

## 2014-07-18 DIAGNOSIS — Z1212 Encounter for screening for malignant neoplasm of rectum: Secondary | ICD-10-CM

## 2014-07-18 DIAGNOSIS — Z79899 Other long term (current) drug therapy: Secondary | ICD-10-CM

## 2014-07-18 DIAGNOSIS — Z111 Encounter for screening for respiratory tuberculosis: Secondary | ICD-10-CM

## 2014-07-18 DIAGNOSIS — I10 Essential (primary) hypertension: Secondary | ICD-10-CM

## 2014-07-18 DIAGNOSIS — Z125 Encounter for screening for malignant neoplasm of prostate: Secondary | ICD-10-CM

## 2014-07-18 DIAGNOSIS — Z1211 Encounter for screening for malignant neoplasm of colon: Secondary | ICD-10-CM

## 2014-07-18 DIAGNOSIS — E1110 Type 2 diabetes mellitus with ketoacidosis without coma: Secondary | ICD-10-CM

## 2014-07-18 DIAGNOSIS — R6889 Other general symptoms and signs: Secondary | ICD-10-CM

## 2014-07-18 DIAGNOSIS — E291 Testicular hypofunction: Secondary | ICD-10-CM

## 2014-07-18 DIAGNOSIS — R5383 Other fatigue: Secondary | ICD-10-CM

## 2014-07-18 LAB — CBC WITH DIFFERENTIAL/PLATELET
Basophils Absolute: 0.1 10*3/uL (ref 0.0–0.1)
Basophils Relative: 1 % (ref 0–1)
EOS ABS: 0.2 10*3/uL (ref 0.0–0.7)
Eosinophils Relative: 2 % (ref 0–5)
HCT: 42 % (ref 39.0–52.0)
HEMOGLOBIN: 14.2 g/dL (ref 13.0–17.0)
LYMPHS PCT: 27 % (ref 12–46)
Lymphs Abs: 2.8 10*3/uL (ref 0.7–4.0)
MCH: 30.9 pg (ref 26.0–34.0)
MCHC: 33.8 g/dL (ref 30.0–36.0)
MCV: 91.3 fL (ref 78.0–100.0)
MPV: 11.4 fL (ref 8.6–12.4)
Monocytes Absolute: 0.9 10*3/uL (ref 0.1–1.0)
Monocytes Relative: 9 % (ref 3–12)
Neutro Abs: 6.2 10*3/uL (ref 1.7–7.7)
Neutrophils Relative %: 61 % (ref 43–77)
Platelets: 230 10*3/uL (ref 150–400)
RBC: 4.6 MIL/uL (ref 4.22–5.81)
RDW: 12.9 % (ref 11.5–15.5)
WBC: 10.2 10*3/uL (ref 4.0–10.5)

## 2014-07-18 LAB — BASIC METABOLIC PANEL WITH GFR
BUN: 19 mg/dL (ref 6–23)
CALCIUM: 9.4 mg/dL (ref 8.4–10.5)
CHLORIDE: 102 meq/L (ref 96–112)
CO2: 29 mEq/L (ref 19–32)
Creat: 1.02 mg/dL (ref 0.50–1.35)
GFR, Est African American: >89 mL/min
GFR, Est Non African American: 85 mL/min
Glucose, Bld: 84 mg/dL (ref 70–99)
Potassium: 4.2 mEq/L (ref 3.5–5.3)
Sodium: 140 mEq/L (ref 135–145)

## 2014-07-18 LAB — HEPATIC FUNCTION PANEL
ALT: 21 U/L (ref 0–53)
AST: 17 U/L (ref 0–37)
Albumin: 4.5 g/dL (ref 3.5–5.2)
Alkaline Phosphatase: 88 U/L (ref 39–117)
BILIRUBIN TOTAL: 0.5 mg/dL (ref 0.2–1.2)
Bilirubin, Direct: 0.1 mg/dL (ref 0.0–0.3)
Indirect Bilirubin: 0.4 mg/dL (ref 0.2–1.2)
TOTAL PROTEIN: 7 g/dL (ref 6.0–8.3)

## 2014-07-18 LAB — IRON AND TIBC
%SAT: 35 % (ref 20–55)
Iron: 103 ug/dL (ref 42–165)
TIBC: 292 ug/dL (ref 215–435)
UIBC: 189 ug/dL (ref 125–400)

## 2014-07-18 LAB — LIPID PANEL
CHOL/HDL RATIO: 4.5 ratio
Cholesterol: 159 mg/dL (ref 0–200)
HDL: 35 mg/dL — AB (ref >=40)
LDL Cholesterol: 84 mg/dL (ref 0–99)
TRIGLYCERIDES: 200 mg/dL — AB (ref <150)
VLDL: 40 mg/dL (ref 0–40)

## 2014-07-18 LAB — MAGNESIUM: Magnesium: 1.9 mg/dL (ref 1.5–2.5)

## 2014-07-18 NOTE — Progress Notes (Signed)
Patient ID: Ryan Nguyen, male   DOB: 1965-12-12, 49 y.o.   MRN: [id number] Annual Comprehensive Examination  This very nice 49 y.o. MWM presents for complete physical.  Patient has been followed for HTN, T2_NIDDM , Hyperlipidemia, and Vitamin D Deficiency.   HTN predates since  13. Patient's BP has been controlled at home.Today's BP: 130/76 mmHg. Patient denies any cardiac symptoms as chest pain, palpitations, shortness of breath, dizziness or ankle swelling.   Patient's hyperlipidemia is controlled with diet and medications. Patient denies myalgias or other medication SE's. Last lipids were at goal -  Total Chol 170; HDL 35; LDL  92; with elevated Trig 214 on 03/11/2014.   Patient has has Morbid Obesity (BMI 40+) and consequent T2_NIDDM and is treated with Metformin since 2015 and patient denies reactive hypoglycemic symptoms, visual blurring, diabetic polys or paresthesias. Last A1c was  6.2% on 03/11/2014.  Patient recently started a fitness program at [employer] 3-4 x week.    Patient also has low Testosterone 239 in 2012 and is deferring treatment present. Finally, patient has history of Vitamin D Deficiency of 6 in 2008  and last vitamin D was still very low 22 on 03/11/2014.  Medication Sig  . atenolol (TENORMIN) 100 MG tablet Take 100 mg by mouth daily.  Marland Kitchen atorvastatin (LIPITOR) 20 MG tablet TAKE 1 TABLET BY MOUTH EVERY DAY  . citalopram (CELEXA) 40 MG tablet TAKE 1 TABLET BY MOUTH EVERY DAY FOR NERVES/STRESS  . dicyclomine (BENTYL) 20 MG tablet TAKE 1 TABLET BY MOUTH 3 TIMES A DAY AS NEEDED  . lisinopril (PRINIVIL,ZESTRIL) 5 MG tablet TAKE 1 TABLET BY MOUTH DAILY.  . metFORMIN 500 MG tablet Take 500 mg by mouth 3 (three) times daily.   . Multiple Vitamins-Minerals  Take 1 tablet by mouth daily.  Marland Kitchen OVER THE COUNTER MEDICATION Take 1 tablet by mouth daily. Vitamin D 2  1.25 mg  . ranitidine (ZANTAC) 300 MG tablet TAKE 1 TABLET TWICE A DAY   Allergies  Allergen Reactions  .  Bee Venom Anaphylaxis  . Chantix [Varenicline]    Past Medical History  Diagnosis Date  . Hypertension   . Hypogonadism male   . Prediabetes   . Obese   . PND (post-nasal drip)   . SOB (shortness of breath) on exertion   . Chest pain   . Hyperlipidemia   . Anxiety   . Vitamin D deficiency   . Elevated hemoglobin A1c    Health Maintenance  Topic Date Due  . OPHTHALMOLOGY EXAM  04/18/1974  . HIV Screening  04/19/1979  . COLONOSCOPY  04/18/2014  . HEMOGLOBIN A1C  09/09/2014  . FOOT EXAM  11/19/2014  . URINE MICROALBUMIN  11/19/2014  . INFLUENZA VACCINE  12/05/2014  . TETANUS/TDAP  05/05/2022  . PNEUMOCOCCAL POLYSACCHARIDE VACCINE  Completed   Immunization History  Administered Date(s) Administered  . Influenza Split 02/01/2013, 03/11/2014  . PPD Test 11/18/2013, 07/18/2014  . Pneumococcal Polysaccharide-23 11/05/2012  . Pneumococcal-Unspecified 05/06/1998  . Td 05/06/2001  . Tdap 05/05/2012   Past Surgical History  Procedure Laterality Date  . Squamous cell carcinoma excision  2004  . Nasal ostea    . Left heart catheterization with coronary angiogram N/A 02/19/2013    Procedure: LEFT HEART CATHETERIZATION WITH CORONARY ANGIOGRAM;  Surgeon: Josue Hector, MD;  Location: [facility] CATH LAB;  Service: Cardiovascular;  Laterality: N/A;   Family History  Problem Relation Age of Onset  . Hypertension Mother   . Diabetes Sister   .  Cancer Father     Lung cancer  . COPD Mother     History   Social History  . Marital Status: Married    Spouse Name: N/A  . Number of Children: 2  . Years of Education: N/A   Occupational History  . Psychologist, counselling for [employer]   Social History Main Topics  . Smoking status: Former Smoker    Quit date: 05/01/2012  . Smokeless tobacco: Not on file  . Alcohol Use: No  . Drug Use: No  . Sexual Activity: Not on file    ROS Constitutional: Denies fever, chills, weight loss/gain, headaches, insomnia, fatigue, night sweats or  change in appetite. Eyes: Denies redness, blurred vision, diplopia, discharge, itchy or watery eyes.  ENT: Denies discharge, congestion, post nasal drip, epistaxis, sore throat, earache, hearing loss, dental pain, Tinnitus, Vertigo, Sinus pain or snoring.  Cardio: Denies chest pain, palpitations, irregular heartbeat, syncope, dyspnea, diaphoresis, orthopnea, PND, claudication or edema Respiratory: denies cough, dyspnea, DOE, pleurisy, hoarseness, laryngitis or wheezing.  Gastrointestinal: Denies dysphagia, heartburn, reflux, water brash, pain, cramps, nausea, vomiting, bloating, diarrhea, constipation, hematemesis, melena, hematochezia, jaundice or hemorrhoids Genitourinary: Denies dysuria, frequency, urgency, nocturia, hesitancy, discharge, hematuria or flank pain Musculoskeletal: Denies arthralgia, myalgia, stiffness, Jt. Swelling, pain, limp or strain/sprain. Denies Falls. Skin: Denies puritis, rash, hives, warts, acne, eczema or change in skin lesion Neuro: No weakness, tremor, incoordination, spasms, paresthesia or pain Psychiatric: Denies confusion, memory loss or sensory loss. Denies Depression. Endocrine: Denies change in weight, skin, hair change, nocturia, and paresthesia, diabetic polys, visual blurring or hyper / hypo glycemic episodes.  Heme/Lymph: No excessive bleeding, bruising or enlarged lymph nodes.  Physical Exam  BP 130/76   Pulse 92  Temp 98.1 F   Resp 16  Ht 5\' 10"   Wt 283 lb 12.8 oz     BMI 40.72   General Appearance: Well nourished, in no apparent distress. Eyes: PERRLA, EOMs, conjunctiva no swelling or erythema, normal fundi and vessels. Sinuses: No frontal/maxillary tenderness ENT/Mouth: EACs patent / TMs  nl. Nares clear without erythema, swelling, mucoid exudates. Oral hygiene is good. No erythema, swelling, or exudate. Tongue normal, non-obstructing. Tonsils not swollen or erythematous. Hearing normal.  Neck: Supple, thyroid normal. No bruits, nodes or  JVD. Respiratory: Respiratory effort normal.  BS equal and clear bilateral without rales, rhonci, wheezing or stridor. Cardio: Heart sounds are normal with regular rate and rhythm and no murmurs, rubs or gallops. Peripheral pulses are normal and equal bilaterally without edema. No aortic or femoral bruits. Chest: symmetric with normal excursions and percussion.  Abdomen: Flat, soft, with bowl sounds. Nontender, no guarding, rebound, hernias, masses, or organomegaly.  Lymphatics: Non tender without lymphadenopathy.  Genitourinary: No hernias.Testes nl. DRE - prostate nl for age - smooth & firm w/o nodules. Musculoskeletal: Full ROM all peripheral extremities, joint stability, 5/5 strength, and normal gait. Skin: Warm and dry without rashes, lesions, cyanosis, clubbing or  ecchymosis.  Neuro: Cranial nerves intact, reflexes equal bilaterally. Normal muscle tone, no cerebellar symptoms. Sensation intact.  Pysch: Awake and oriented X 3 with normal affect, insight and judgment appropriate.   Assessment and Plan  1. Essential hypertension  - Microalbumin / creatinine urine ratio - EKG 12-Lead - [place], RETROPERITNL ABD,  LTD - DG Chest 2 View; Future  2. Hyperlipidemia  - Lipid panel  3. T2 NIDDM  - Hemoglobin A1c - Insulin, random  4. Vitamin D deficiency  - Vit D  25 hydroxy  5. Testosterone Deficiency  - Testosterone  6. Morbid obesity (BMI 40.25)   7. Medication management  - Urine Microscopic - CBC with Differential/Platelet - BASIC METABOLIC PANEL WITH GFR - Hepatic function panel - Magnesium  8. Screening for rectal cancer  - POC Hemoccult Bld/Stl (3-Cd Home Screen); Future  9. Prostate cancer screening  - PSA  10. Other fatigue  - Vitamin B12 - Iron and TIBC - TSH  11. Special screening for malignant neoplasms, colon  - Ambulatory referral to Gastroenterology  12. Screening examination for pulmonary tuberculosis  - PPD - quit smoking 3 weeks ago  and is scheduled for CXR      Continue prudent diet as discussed, weight control, BP monitoring, regular exercise, and medications as discussed.  Discussed med effects and SE's. Routine screening labs and tests as requested with regular follow-up as recommended.

## 2014-07-18 NOTE — Patient Instructions (Signed)

## 2014-07-19 LAB — TESTOSTERONE: TESTOSTERONE: 188 ng/dL — AB (ref 300–890)

## 2014-07-19 LAB — URINALYSIS, MICROSCOPIC ONLY
BACTERIA UA: NONE SEEN
CASTS: NONE SEEN
Crystals: NONE SEEN
Squamous Epithelial / LPF: NONE SEEN

## 2014-07-19 LAB — VITAMIN D 25 HYDROXY (VIT D DEFICIENCY, FRACTURES): VIT D 25 HYDROXY: 13 ng/mL — AB (ref 30–100)

## 2014-07-19 LAB — PSA: PSA: 0.2 ng/mL (ref <=4.00)

## 2014-07-19 LAB — HEMOGLOBIN A1C
Hgb A1c MFr Bld: 5.9 % — ABNORMAL HIGH (ref <5.7)
MEAN PLASMA GLUCOSE: 123 mg/dL — AB (ref <117)

## 2014-07-19 LAB — VITAMIN B12: Vitamin B-12: 369 pg/mL (ref 211–911)

## 2014-07-19 LAB — INSULIN, RANDOM: Insulin: 72.6 u[IU]/mL — ABNORMAL HIGH (ref 2.0–19.6)

## 2014-07-19 LAB — MICROALBUMIN / CREATININE URINE RATIO
Creatinine, Urine: 263 mg/dL
Microalb Creat Ratio: 4.2 mg/g (ref 0.0–30.0)
Microalb, Ur: 1.1 mg/dL (ref <2.0)

## 2014-07-19 LAB — TSH: TSH: 2.471 u[IU]/mL (ref 0.350–4.500)

## 2014-07-25 ENCOUNTER — Telehealth: Payer: Self-pay | Admitting: *Deleted

## 2014-07-25 LAB — TB SKIN TEST
Induration: 0 mm
TB SKIN TEST: NEGATIVE

## 2014-07-25 NOTE — Telephone Encounter (Signed)
Pt aware of lab results 

## 2014-08-01 ENCOUNTER — Encounter: Payer: Self-pay | Admitting: Gastroenterology

## 2014-08-14 ENCOUNTER — Other Ambulatory Visit: Payer: Self-pay | Admitting: Physician Assistant

## 2014-09-26 ENCOUNTER — Ambulatory Visit (AMBULATORY_SURGERY_CENTER): Payer: Self-pay | Admitting: *Deleted

## 2014-09-26 VITALS — Ht 70.0 in | Wt 284.8 lb

## 2014-09-26 DIAGNOSIS — Z1211 Encounter for screening for malignant neoplasm of colon: Secondary | ICD-10-CM

## 2014-09-26 MED ORDER — NA SULFATE-K SULFATE-MG SULF 17.5-3.13-1.6 GM/177ML PO SOLN: 1.0000 | Freq: Once | ORAL | Status: DC

## 2014-09-26 NOTE — Progress Notes (Signed)
Denies allergies to eggs or soy products. Denies complications with sedation or anesthesia. Denies O2 use. Denies use of diet or weight loss medications.  Emmi instructions given for colonoscopy.  

## 2014-09-29 ENCOUNTER — Other Ambulatory Visit: Payer: Self-pay | Admitting: Cardiovascular Disease

## 2014-10-14 ENCOUNTER — Encounter: Payer: Self-pay | Admitting: Gastroenterology

## 2014-10-14 ENCOUNTER — Ambulatory Visit (AMBULATORY_SURGERY_CENTER): Payer: PRIVATE HEALTH INSURANCE | Admitting: Gastroenterology

## 2014-10-14 VITALS — BP 124/47 | HR 71 | Temp 97.3°F | Resp 17 | Ht 70.0 in | Wt 284.0 lb

## 2014-10-14 DIAGNOSIS — Z1211 Encounter for screening for malignant neoplasm of colon: Secondary | ICD-10-CM

## 2014-10-14 DIAGNOSIS — D120 Benign neoplasm of cecum: Secondary | ICD-10-CM | POA: Diagnosis not present

## 2014-10-14 DIAGNOSIS — K5730 Diverticulosis of large intestine without perforation or abscess without bleeding: Secondary | ICD-10-CM

## 2014-10-14 MED ORDER — SODIUM CHLORIDE 0.9 % IV SOLN: 500.0000 mL | INTRAVENOUS | Status: DC

## 2014-10-14 NOTE — Progress Notes (Signed)
To recovery, report to Willis, RN, VSS 

## 2014-10-14 NOTE — Progress Notes (Signed)
No problems noted in the recovery room. maw 

## 2014-10-14 NOTE — Patient Instructions (Signed)
YOU HAD AN ENDOSCOPIC PROCEDURE TODAY AT [facility]:   Refer to the procedure report that was given to you for any specific questions about what was found during the examination.  If the procedure report does not answer your questions, please call your gastroenterologist to clarify.  If you requested that your care partner not be given the details of your procedure findings, then the procedure report has been included in a sealed envelope for you to review at your convenience later.  YOU SHOULD EXPECT: Some feelings of bloating in the abdomen. Passage of more gas than usual.  Walking can help get rid of the air that was put into your GI tract during the procedure and reduce the bloating. If you had a lower endoscopy (such as a colonoscopy or flexible sigmoidoscopy) you may notice spotting of blood in your stool or on the toilet paper. If you underwent a bowel prep for your procedure, you may not have a normal bowel movement for a few days.  Please Note:  You might notice some irritation and congestion in your nose or some drainage.  This is from the oxygen used during your procedure.  There is no need for concern and it should clear up in a day or so.  SYMPTOMS TO REPORT IMMEDIATELY:   Following lower endoscopy (colonoscopy or flexible sigmoidoscopy):  Excessive amounts of blood in the stool  Significant tenderness or worsening of abdominal pains  Swelling of the abdomen that is new, acute  Fever of 100F or higher    For urgent or emergent issues, a gastroenterologist can be reached at any hour by calling [phone].   DIET: Your first meal following the procedure should be a small meal and then it is ok to progress to your normal diet. Heavy or fried foods are harder to digest and may make you feel nauseous or bloated.  Likewise, meals heavy in dairy and vegetables can increase bloating.  Drink plenty of fluids but you should avoid alcoholic beverages for 24  hours.  ACTIVITY:  You should plan to take it easy for the rest of today and you should NOT DRIVE or use heavy machinery until tomorrow (because of the sedation medicines used during the test).    FOLLOW UP: Our staff will call the number listed on your records the next business day following your procedure to check on you and address any questions or concerns that you may have regarding the information given to you following your procedure. If we do not reach you, we will leave a message.  However, if you are feeling well and you are not experiencing any problems, there is no need to return our call.  We will assume that you have returned to your regular daily activities without incident.  If any biopsies were taken you will be contacted by phone or by letter within the next 1-3 weeks.  Please call us at [phone] if you have not heard about the biopsies in 3 weeks.    SIGNATURES/CONFIDENTIALITY: You and/or your care partner have signed paperwork which will be entered into your electronic medical record.  These signatures attest to the fact that that the information above on your After Visit Summary has been reviewed and is understood.  Full responsibility of the confidentiality of this discharge information lies with you and/or your care-partner.    Handouts were given to your care partner on polyps, diverticulosis,and a high fiber diet with liberal fluid intake. You may resume  current medications today. Await biopsy results. Please call if any questions or concerns.   

## 2014-10-14 NOTE — Op Note (Signed)
[facility]  [address] [place], [address]   COLONOSCOPY PROCEDURE REPORT  PATIENT: Ryan Nguyen, Ryan Nguyen  MR#: [id number] BIRTHDATE: 16-Nov-1965 , 48  yrs. old GENDER: male ENDOSCOPIST: Inda Castle, MD REFERRED [id number] Melford Aase, M.D. PROCEDURE DATE:  10/14/2014 PROCEDURE:   Colonoscopy, screening and Colonoscopy with cold biopsy polypectomy First Screening Colonoscopy - Avg.  risk and is 50 yrs.  old or older Yes.  Prior Negative Screening - Now for repeat screening. N/A  History of Adenoma - Now for follow-up colonoscopy & has been > or = to 3 yrs.  N/A  Polyps removed today? Yes ASA CLASS:   Class II INDICATIONS: MEDICATIONS: Monitored anesthesia care  DESCRIPTION OF PROCEDURE:   After the risks benefits and alternatives of the procedure were thoroughly explained, informed consent was obtained.  The digital rectal exam revealed no abnormalities of the rectum.   The [id number]  endoscope was introduced through the anus and advanced to the cecum, which was identified by both the appendix and ileocecal valve. No adverse events experienced.   The quality of the prep was (Suprep was used) good.  The instrument was then slowly withdrawn as the colon was fully examined. Estimated blood loss is zero unless otherwise noted in this procedure report.      COLON FINDINGS: There was mild diverticulosis noted in the sigmoid colon.   A sessile polyp measuring 2 mm in size was found at the cecum.  A polypectomy was performed with cold forceps.   The examination was otherwise normal.  Retroflexed views revealed no abnormalities. The time to cecum =    Withdrawal time =      The scope was withdrawn and the procedure completed. COMPLICATIONS: There were no immediate complications.  ENDOSCOPIC IMPRESSION: 1.   Mild diverticulosis was noted in the sigmoid colon 2.   Sessile polyp was found at the cecum; polypectomy was performed with cold forceps 3.   The  examination was otherwise normal  RECOMMENDATIONS: If the polyp(s) removed today are proven to be adenomatous (pre-cancerous) polyps, you will need a repeat colonoscopy in 5 years.  Otherwise you should continue to follow colorectal cancer screening guidelines for "routine risk" patients with colonoscopy in 10 years.  You will receive a letter within 1-2 weeks with the results of your biopsy as well as final recommendations.  Please call my office if you have not received a letter after 3 weeks.  eSigned:  Inda Castle, MD 10/14/2014 1:55 PM   cc:   PATIENT NAME:  Ryan Nguyen, Ryan Nguyen MR#: [id number]

## 2014-10-14 NOTE — Progress Notes (Signed)
Called to room to assist during endoscopic procedure.  Patient ID and intended procedure confirmed with present staff. Received instructions for my participation in the procedure from the performing physician.  

## 2014-10-17 ENCOUNTER — Telehealth: Payer: Self-pay | Admitting: *Deleted

## 2014-10-17 NOTE — Telephone Encounter (Signed)
  Follow up Call-  Call back number 10/14/2014  Post procedure Call Back phone  # [phone]  Permission to leave phone message Yes     Patient questions:  Do you have a fever, pain , or abdominal swelling? No. Pain Score  0 *  Have you tolerated food without any problems? Yes.    Have you been able to return to your normal activities? Yes.    Do you have any questions about your discharge instructions: Diet   No. Medications  No. Follow up visit  No.  Do you have questions or concerns about your Care? No.  Actions: * If pain score is 4 or above: No action needed, pain <4.

## 2014-10-19 ENCOUNTER — Encounter: Payer: Self-pay | Admitting: Gastroenterology

## 2014-10-20 ENCOUNTER — Ambulatory Visit (INDEPENDENT_AMBULATORY_CARE_PROVIDER_SITE_OTHER): Payer: 59 | Admitting: Internal Medicine

## 2014-10-20 ENCOUNTER — Encounter: Payer: Self-pay | Admitting: Internal Medicine

## 2014-10-20 VITALS — BP 144/92 | HR 88 | Temp 98.0°F | Resp 18 | Ht 70.0 in | Wt 288.0 lb

## 2014-10-20 DIAGNOSIS — Z79899 Other long term (current) drug therapy: Secondary | ICD-10-CM

## 2014-10-20 DIAGNOSIS — E1310 Other specified diabetes mellitus with ketoacidosis without coma: Secondary | ICD-10-CM

## 2014-10-20 DIAGNOSIS — E785 Hyperlipidemia, unspecified: Secondary | ICD-10-CM

## 2014-10-20 DIAGNOSIS — E1110 Type 2 diabetes mellitus with ketoacidosis without coma: Secondary | ICD-10-CM

## 2014-10-20 DIAGNOSIS — I10 Essential (primary) hypertension: Secondary | ICD-10-CM

## 2014-10-20 DIAGNOSIS — E559 Vitamin D deficiency, unspecified: Secondary | ICD-10-CM

## 2014-10-20 MED ORDER — LISINOPRIL 5 MG PO TABS
5.0000 mg | ORAL_TABLET | Freq: Every day | ORAL | Status: DC
Start: 2014-10-20 — End: 2015-05-15

## 2014-10-20 NOTE — Progress Notes (Signed)
Patient ID: Ryan Nguyen, male   DOB: 1966-05-06, 49 y.o.   MRN: [id number]  Assessment and Plan:  Hypertension:  -Continue medication -monitor blood pressure at home. -Continue DASH diet -Reminder to go to the ER if any CP, SOB, nausea, dizziness, severe HA, changes vision/speech, left arm numbness and tingling and jaw pain.  Cholesterol - Continue diet and exercise -Check cholesterol.   Diabetes without complications -Continue diet and exercise.  -Check A1C  Vitamin D Def -check level -continue medications.   Continue diet and meds as discussed. Further disposition pending results of labs. Discussed med's effects and SE's.    HPI 49 y.o. male  presents for 3 month follow up with hypertension, hyperlipidemia, diabetes and vitamin D deficiency.   His blood pressure has been controlled at home, today their BP is BP: (!) 144/92 mmHg.He does workout.  He reports that he is now going to [employer] and he is trying to go 3 times weekly.  He does cardio every other day and uses the fitness machines the off days.   He denies chest pain, shortness of breath, dizziness.   He is on cholesterol medication and denies myalgias. His cholesterol is at goal. The cholesterol was:  07/18/2014: Cholesterol 159; HDL 35*; LDL Cholesterol 84; Triglycerides 200*   He has been working on diet and exercise for diabetes without complications, he is not on bASA, he is on ACE/ARB, and denies  foot ulcerations, hyperglycemia, hypoglycemia , increased appetite, nausea, paresthesia of the feet, polydipsia, polyuria, visual disturbances, vomiting and weight loss. Last A1C was: 07/18/2014: Hgb A1c MFr Bld 5.9*   Patient is on Vitamin D supplement. 07/18/2014: Vit D, 25-Hydroxy 13*    Current Medications:  Current Outpatient Prescriptions on File Prior to Visit  Medication Sig Dispense Refill  . atorvastatin (LIPITOR) 20 MG tablet TAKE 1 TABLET BY MOUTH EVERY DAY 90 tablet 1  . citalopram (CELEXA) 40 MG  tablet TAKE 1 TABLET BY MOUTH EVERY DAY FOR NERVES/STRESS 90 tablet 3  . dicyclomine (BENTYL) 20 MG tablet TAKE 1 TABLET BY MOUTH 3 TIMES A DAY AS NEEDED FOR CRAMPING, BLOATING, OR NAUSEA 90 tablet 3  . Multiple Vitamins-Minerals (MULTIVITAMIN WITH MINERALS) tablet Take 1 tablet by mouth daily.    Marland Kitchen OVER THE COUNTER MEDICATION Take 1 tablet by mouth daily. Vitamin D 2  1.25 mg    . ranitidine (ZANTAC) 300 MG tablet TAKE 1 TABLET TWICE A DAY 180 tablet 2   No current facility-administered medications on file prior to visit.   Medical History:  Past Medical History  Diagnosis Date  . Hypertension   . Hypogonadism male   . Prediabetes   . Obese   . PND (post-nasal drip)   . SOB (shortness of breath) on exertion   . Chest pain   . Hyperlipidemia   . Anxiety   . Vitamin D deficiency   . Elevated hemoglobin A1c    Allergies:  Allergies  Allergen Reactions  . Bee Venom Anaphylaxis  . Chantix [Varenicline]      Review of Systems:  Review of Systems  Constitutional: Negative for fever, chills and malaise/fatigue.  HENT: Negative for congestion, nosebleeds and sore throat.   Eyes: Negative.   Respiratory: Negative for cough, shortness of breath and wheezing.   Cardiovascular: Negative for chest pain, palpitations and leg swelling.  Gastrointestinal: Negative for heartburn, nausea, vomiting, diarrhea, constipation, blood in stool and melena.  Genitourinary: Negative.   Musculoskeletal: Negative.   Skin: Negative.  Neurological: Negative for dizziness, sensory change, loss of consciousness and headaches.  Psychiatric/Behavioral: Negative for depression. The patient is not nervous/anxious and does not have insomnia.     Family history- Review and unchanged  Social history- Review and unchanged  Physical Exam: BP 144/92 mmHg  Pulse 88  Temp(Src) 98 F (36.7 C) (Temporal)  Resp 18  Ht 5\' 10"  (1.778 m)  Wt 288 lb (130.636 kg)  BMI 41.32 kg/m2 Wt Readings from Last 3  Encounters:  10/20/14 288 lb (130.636 kg)  10/14/14 284 lb (128.822 kg)  09/26/14 284 lb 12.8 oz (129.184 kg)   General Appearance: Well nourished well developed, non-toxic appearing, in no apparent distress. Eyes: PERRLA, EOMs, conjunctiva no swelling or erythema ENT/Mouth: Ear canals clear with no erythema, swelling, or discharge.  TMs normal bilaterally, oropharynx clear, moist, with no exudate.   Neck: Supple, thyroid normal, no JVD, no cervical adenopathy.  Respiratory: Respiratory effort normal, breath sounds clear A&P, no wheeze, rhonchi or rales noted.  No retractions, no accessory muscle usage Cardio: RRR with no MRGs. No noted edema.  Abdomen: Soft, + BS.  Non tender, no guarding, rebound, hernias, masses. Musculoskeletal: Full ROM, 5/5 strength, Normal gait Skin: Warm, dry without rashes, lesions, ecchymosis.  Neuro: Awake and oriented X 3, Cranial nerves intact. No cerebellar symptoms.  Psych: normal affect, Insight and Judgment appropriate.    FORCUCCI, Deazia Lampi, PA-C 4:22 PM [facility]

## 2014-10-20 NOTE — Patient Instructions (Signed)
We want weight loss that will last so you should lose 1-2 pounds a week.  THAT IS IT! Please pick THREE things a month to change. Once it is a habit check off the item. Then pick another three items off the list to become habits.  If you are already doing a habit on the list GREAT!  Cross that item off! o Don't drink your calories. Ie, alcohol, soda, fruit juice, and sweet tea.  o Drink more water. Drink a glass when you feel hungry or before each meal.  o Eat breakfast - Complex carb and protein (likeDannon light and fit yogurt, oatmeal, fruit, eggs, turkey bacon). o Measure your cereal.  Eat no more than one cup a day. (ie Kashi) o Eat an apple a day. o Add a vegetable a day. o Try a new vegetable a month. o Use Pam! Stop using oil or butter to cook. o Don't finish your plate or use smaller plates. o Share your dessert. o Eat sugar free Jello for dessert or frozen grapes. o Don't eat 2-3 hours before bed. o Switch to whole wheat bread, pasta, and brown rice. o Make healthier choices when you eat out. No fries! o Pick baked chicken, NOT fried. o Don't forget to SLOW DOWN when you eat. It is not going anywhere.  o Take the stairs. o Park far away in the parking lot o Lift soup cans (or weights) for 10 minutes while watching TV. o Walk at work for 10 minutes during break. o Walk outside 1 time a week with your friend, kids, dog, or significant other. o Start a walking group at church. o Walk the mall as much as you can tolerate.  o Keep a food diary. o Weigh yourself daily. o Walk for 15 minutes 3 days per week. o Cook at home more often and eat out less.  If life happens and you go back to old habits, it is okay.  Just start over. You can do it!   If you experience chest pain, get short of breath, or tired during the exercise, please stop immediately and inform your doctor.   Ways to cut 100 calories  1. Eat your eggs with hot sauce OR salsa instead of cheese.  Eggs are great for  breakfast, but many people consider eggs and cheese to be BFFs. Instead of cheese-1 oz. of cheddar has 114 calories-top your eggs with hot sauce, which contains no calories and helps with satiety and metabolism. Salsa is also a great option!!  2. Top your toast, waffles or pancakes with mashed berries instead of jelly or syrup. Half a cup of berries-fresh, frozen or thawed-has about 40 calories, compared with 2 tbsp. of maple syrup or jelly, which both have about 100 calories. The berries will also give you a good punch of fiber, which helps keep you full and satisfied and won't spike blood sugar quickly like the jelly or syrup. 3. Swap the non-fat latte for black coffee with a splash of half-and-half. Contrary to its name, that non-fat latte has 130 calories and a startling 19g of carbohydrates per 16 oz. serving. Replacing that 'light' drinkable dessert with a black coffee with a splash of half-and-half saves you more than 100 calories per 16 oz. serving. 4. Sprinkle salads with freeze-dried raspberries instead of dried cranberries. If you want a sweet addition to your nutritious salad, stay away from dried cranberries. They have a whopping 130 calories per  cup and 30g carbohydrates. Instead, sprinkle   freeze-dried raspberries guilt-free and save more than 100 calories per  cup serving, adding 3g of belly-filling fiber. 5. Go for mustard in place of mayo on your sandwich. Mustard can add really nice flavor to any sandwich, and there are tons of varieties, from spicy to honey. A serving of mayo is 95 calories, versus 10 calories in a serving of mustard. 6. Choose a DIY salad dressing instead of the store-bought kind. Mix Dijon or whole grain mustard with low-fat Kefir or red wine vinegar and garlic. 7. Use hummus as a spread instead of a dip. Use hummus as a spread on a high-fiber cracker or tortilla with a sandwich and save on calories without sacrificing taste. 8. Pick just one salad  "accessory." Salad isn't automatically a calorie winner. It's easy to over-accessorize with toppings. Instead of topping your salad with nuts, avocado and cranberries (all three will clock in at 313 calories), just pick one. The next day, choose a different accessory, which will also keep your salad interesting. You don't wear all your jewelry every day, right? 9. Ditch the white pasta in favor of spaghetti squash. One cup of cooked spaghetti squash has about 40 calories, compared with traditional spaghetti, which comes with more than 200. Spaghetti squash is also nutrient-dense. It's a good source of fiber and Vitamins A and C, and it can be eaten just like you would eat pasta-with a great tomato sauce and turkey meatballs or with pesto, tofu and spinach, for example. 10. Dress up your chili, soups and stews with non-fat Greek yogurt instead of sour cream. Just a 'dollop' of sour cream can set you back 115 calories and a whopping 12g of fat-seven of which are of the artery-clogging variety. Added bonus: Greek yogurt is packed with muscle-building protein, calcium and B Vitamins. 11. Mash cauliflower instead of mashed potatoes. One cup of traditional mashed potatoes-in all their creamy goodness-has more than 200 calories, compared to mashed cauliflower, which you can typically eat for less than 100 calories per 1 cup serving. Cauliflower is a great source of the antioxidant indole-3-carbinol (I3C), which may help reduce the risk of some cancers, like breast cancer. 12. Ditch the ice cream sundae in favor of a Greek yogurt parfait. Instead of a cup of ice cream or fro-yo for dessert, try 1 cup of nonfat Greek yogurt topped with fresh berries and a sprinkle of cacao nibs. Both toppings are packed with antioxidants, which can help reduce cellular inflammation and oxidative damage. And the comparison is a no-brainer: One cup of ice cream has about 275 calories; one cup of frozen yogurt has about 230; and a cup  of Greek yogurt has just 130, plus twice the protein, so you're less likely to return to the freezer for a second helping. 13. Put olive oil in a spray container instead of using it directly from the bottle. Each tablespoon of olive oil is 120 calories and 15g of fat. Use a mister instead of pouring it straight into the pan or onto a salad. This allows for portion control and will save you more than 100 calories. 14. When baking, substitute canned pumpkin for butter or oil. Canned pumpkin-not pumpkin pie mix-is loaded with Vitamin A, which is important for skin and eye health, as well as immunity. And the comparisons are pretty crazy:  cup of canned pumpkin has about 40 calories, compared to butter or oil, which has more than 800 calories. Yes, 800 calories. Applesauce and mashed banana can also serve as   good substitutions for butter or oil, usually in a 1:1 ratio. 15. Top casseroles with high-fiber cereal instead of breadcrumbs. Breadcrumbs are typically made with white bread, while breakfast cereals contain 5-9g of fiber per serving. Not only will you save more than 150 calories per  cup serving, the swap will also keep you more full and you'll get a metabolism boost from the added fiber. 16. Snack on pistachios instead of macadamia nuts. Believe it or not, you get the same amount of calories from 35 pistachios (100 calories) as you would from only five macadamia nuts. 17. Chow down on kale chips rather than potato chips. This is my favorite 'don't knock it 'till you try it' swap. Kale chips are so easy to make at home, and you can spice them up with a little grated parmesan or chili powder. Plus, they're a mere fraction of the calories of potato chips, but with the same crunch factor we crave so often. 18. Add seltzer and some fruit slices to your cocktail instead of soda or fruit juice. One cup of soda or fruit juice can pack on as much as 140 calories. Instead, use seltzer and fruit slices. The  fruit provides valuable phytochemicals, such as flavonoids and anthocyanins, which help to combat cancer and stave off the aging process. 

## 2014-10-21 LAB — CBC WITH DIFFERENTIAL/PLATELET
Basophils Absolute: 0.1 10*3/uL (ref 0.0–0.1)
Basophils Relative: 1 % (ref 0–1)
Eosinophils Absolute: 0.3 10*3/uL (ref 0.0–0.7)
Eosinophils Relative: 3 % (ref 0–5)
HEMATOCRIT: 41 % (ref 39.0–52.0)
Hemoglobin: 13.6 g/dL (ref 13.0–17.0)
LYMPHS PCT: 26 % (ref 12–46)
Lymphs Abs: 2.3 10*3/uL (ref 0.7–4.0)
MCH: 30.3 pg (ref 26.0–34.0)
MCHC: 33.2 g/dL (ref 30.0–36.0)
MCV: 91.3 fL (ref 78.0–100.0)
MONO ABS: 0.9 10*3/uL (ref 0.1–1.0)
MONOS PCT: 10 % (ref 3–12)
MPV: 12 fL (ref 8.6–12.4)
NEUTROS ABS: 5.2 10*3/uL (ref 1.7–7.7)
Neutrophils Relative %: 60 % (ref 43–77)
Platelets: 216 10*3/uL (ref 150–400)
RBC: 4.49 MIL/uL (ref 4.22–5.81)
RDW: 13 % (ref 11.5–15.5)
WBC: 8.7 10*3/uL (ref 4.0–10.5)

## 2014-10-21 LAB — BASIC METABOLIC PANEL WITH GFR
BUN: 24 mg/dL — AB (ref 6–23)
CO2: 27 mEq/L (ref 19–32)
Calcium: 9.6 mg/dL (ref 8.4–10.5)
Chloride: 103 mEq/L (ref 96–112)
Creat: 0.89 mg/dL (ref 0.50–1.35)
GLUCOSE: 98 mg/dL (ref 70–99)
Potassium: 4.6 mEq/L (ref 3.5–5.3)
Sodium: 141 mEq/L (ref 135–145)

## 2014-10-21 LAB — HEPATIC FUNCTION PANEL
ALBUMIN: 4.5 g/dL (ref 3.5–5.2)
ALK PHOS: 86 U/L (ref 39–117)
ALT: 24 U/L (ref 0–53)
AST: 18 U/L (ref 0–37)
BILIRUBIN INDIRECT: 0.5 mg/dL (ref 0.2–1.2)
BILIRUBIN TOTAL: 0.6 mg/dL (ref 0.2–1.2)
Bilirubin, Direct: 0.1 mg/dL (ref 0.0–0.3)
Total Protein: 6.9 g/dL (ref 6.0–8.3)

## 2014-10-21 LAB — LIPID PANEL
Cholesterol: 175 mg/dL (ref 0–200)
HDL: 30 mg/dL — ABNORMAL LOW (ref >=40)
LDL Cholesterol: 96 mg/dL (ref 0–99)
Total CHOL/HDL Ratio: 5.8 Ratio
Triglycerides: 244 mg/dL — ABNORMAL HIGH (ref <150)
VLDL: 49 mg/dL — ABNORMAL HIGH (ref 0–40)

## 2014-10-21 LAB — TSH: TSH: 1.862 u[IU]/mL (ref 0.350–4.500)

## 2014-10-21 LAB — HEMOGLOBIN A1C
Hgb A1c MFr Bld: 6 % — ABNORMAL HIGH (ref <5.7)
Mean Plasma Glucose: 126 mg/dL — ABNORMAL HIGH (ref <117)

## 2014-10-21 LAB — MAGNESIUM: MAGNESIUM: 2 mg/dL (ref 1.5–2.5)

## 2014-10-21 LAB — INSULIN, RANDOM: Insulin: 70.6 u[IU]/mL — ABNORMAL HIGH (ref 2.0–19.6)

## 2014-10-21 LAB — VITAMIN D 25 HYDROXY (VIT D DEFICIENCY, FRACTURES): Vit D, 25-Hydroxy: 15 ng/mL — ABNORMAL LOW (ref 30–100)

## 2014-11-21 ENCOUNTER — Encounter: Payer: Self-pay | Admitting: Emergency Medicine

## 2014-11-29 ENCOUNTER — Encounter: Payer: Self-pay | Admitting: Internal Medicine

## 2014-12-20 ENCOUNTER — Other Ambulatory Visit: Payer: Self-pay | Admitting: Internal Medicine

## 2015-01-26 ENCOUNTER — Ambulatory Visit (INDEPENDENT_AMBULATORY_CARE_PROVIDER_SITE_OTHER): Payer: 59 | Admitting: Internal Medicine

## 2015-01-26 ENCOUNTER — Encounter: Payer: Self-pay | Admitting: Internal Medicine

## 2015-01-26 VITALS — BP 126/84 | HR 84 | Temp 97.6°F | Resp 16 | Ht 70.0 in | Wt 292.0 lb

## 2015-01-26 DIAGNOSIS — E291 Testicular hypofunction: Secondary | ICD-10-CM | POA: Diagnosis not present

## 2015-01-26 DIAGNOSIS — Z6841 Body Mass Index (BMI) 40.0 and over, adult: Secondary | ICD-10-CM

## 2015-01-26 DIAGNOSIS — E119 Type 2 diabetes mellitus without complications: Secondary | ICD-10-CM | POA: Diagnosis not present

## 2015-01-26 DIAGNOSIS — E559 Vitamin D deficiency, unspecified: Secondary | ICD-10-CM | POA: Diagnosis not present

## 2015-01-26 DIAGNOSIS — E785 Hyperlipidemia, unspecified: Secondary | ICD-10-CM | POA: Diagnosis not present

## 2015-01-26 DIAGNOSIS — Z79899 Other long term (current) drug therapy: Secondary | ICD-10-CM | POA: Diagnosis not present

## 2015-01-26 DIAGNOSIS — I10 Essential (primary) hypertension: Secondary | ICD-10-CM | POA: Diagnosis not present

## 2015-01-26 DIAGNOSIS — R7303 Prediabetes: Secondary | ICD-10-CM | POA: Insufficient documentation

## 2015-01-26 DIAGNOSIS — R7309 Other abnormal glucose: Secondary | ICD-10-CM | POA: Insufficient documentation

## 2015-01-26 NOTE — Progress Notes (Signed)
Patient ID: Ryan Nguyen, male   DOB: 01/15/1966, 49 y.o.   MRN: [id number]   This very nice 49 y.o. MWM presents for follow up with Hypertension, Hyperlipidemia, Pre-Diabetes and Vitamin D Deficiency. Patient also has GERD well controlled with diet and meds.    Patient is treated for HTN since 1999 & BP has been controlled at home. Today's BP: 126/84 mmHg. Patient has had no complaints of any cardiac type chest pain, palpitations, dyspnea/orthopnea/PND, dizziness, claudication, or dependent edema.   Hyperlipidemia is controlled with diet & meds. Patient denies myalgias or other med SE's. Last Lipids were at goal - Cholesterol 175; HDL 30*; LDL 96;and elevated Triglycerides 244 on 10/20/2014.   Also, the patient has history of Morbid Obesity (BMI 41.90) and consequent PreDiabetes since 2011 with A1c 5.9% and he has had no symptoms of reactive hypoglycemia, diabetic polys, paresthesias or visual blurring.  Last A1c was 6.0% on 10/20/2014.    Further, the patient also has history of Vitamin D Deficiency of 6 in 2008 and supplements vitamin D without any suspected side-effects. Last vitamin D was 15 on 10/20/2014.   Medication Sig  . atorvastatin  20 MG TAKE 1 TABLET BY MOUTH EVERY DAY  . citalopram  40 MG TAKE 1 TABLET BY MOUTH EVERY DAY FOR NERVES/STRESS  . dicyclomine 20 MG  TAKE 1 TABLET BY MOUTH 3 TIMES A DAY AS NEEDED FOR CRAMPING, BLOATING, OR NAUSEA  . lisinopril  5 MG  Take 1 tablet (5 mg total) by mouth daily.  . Multiple Vitamins-Minerals  Take 1 tablet by mouth daily.  .  Vitamin D 2  1.25 mg Take 1 tablet by mouth daily.  . ranitidine (ZANTAC) 300 MG TAKE 1 TABLET TWICE A DAY   Allergies  Allergen Reactions  . Bee Venom Anaphylaxis  . Chantix [Varenicline]    PMHx:   Past Medical History  Diagnosis Date  . Hypertension   . Hypogonadism male   . Prediabetes   . Obese   . PND (post-nasal drip)   . SOB (shortness of breath) on exertion   . Chest pain   . Hyperlipidemia    . Anxiety   . Vitamin D deficiency   . Elevated hemoglobin A1c    Immunization History  Administered Date(s) Administered  . Influenza Split 02/01/2013, 03/11/2014  . PPD Test 11/18/2013, 07/18/2014  . Pneumococcal Polysaccharide-23 11/05/2012  . Pneumococcal-Unspecified 05/06/1998  . Td 05/06/2001  . Tdap 05/05/2012   Past Surgical History  Procedure Laterality Date  . Squamous cell carcinoma excision  2004  . Nasal ostea    . Left heart catheterization with coronary angiogram N/A 02/19/2013    Procedure: LEFT HEART CATHETERIZATION WITH CORONARY ANGIOGRAM;  Surgeon: Josue Hector, MD;  Location: [facility] CATH LAB;  Service: Cardiovascular;  Laterality: N/A;   FHx:    Reviewed / unchanged  SHx:    Reviewed / unchanged  Systems Review:  Constitutional: Denies fever, chills, wt changes, headaches, insomnia, fatigue, night sweats, change in appetite. Eyes: Denies redness, blurred vision, diplopia, discharge, itchy, watery eyes.  ENT: Denies discharge, congestion, post nasal drip, epistaxis, sore throat, earache, hearing loss, dental pain, tinnitus, vertigo, sinus pain, snoring.  CV: Denies chest pain, palpitations, irregular heartbeat, syncope, dyspnea, diaphoresis, orthopnea, PND, claudication or edema. Respiratory: denies cough, dyspnea, DOE, pleurisy, hoarseness, laryngitis, wheezing.  Gastrointestinal: Denies dysphagia, odynophagia, heartburn, reflux, water brash, abdominal pain or cramps, nausea, vomiting, bloating, diarrhea, constipation, hematemesis, melena, hematochezia  or hemorrhoids. Genitourinary: Denies dysuria,  frequency, urgency, nocturia, hesitancy, discharge, hematuria or flank pain. Musculoskeletal: Denies arthralgias, myalgias, stiffness, jt. swelling, pain, limping or strain/sprain.  Skin: Denies pruritus, rash, hives, warts, acne, eczema or change in skin lesion(s). Neuro: No weakness, tremor, incoordination, spasms, paresthesia or pain. Psychiatric: Denies  confusion, memory loss or sensory loss. Endo: Denies change in weight, skin or hair change.  Heme/Lymph: No excessive bleeding, bruising or enlarged lymph nodes.  Physical Exam  BP 126/84   Pulse 84  Temp 97.6 F   Resp 16  Ht 5\' 10"    Wt 292 lb     BMI 41.90   Appears well nourished and in no distress. Eyes: PERRLA, EOMs, conjunctiva no swelling or erythema. Sinuses: No frontal/maxillary tenderness ENT/Mouth: EAC's clear, TM's nl w/o erythema, bulging. Nares clear w/o erythema, swelling, exudates. Oropharynx clear without erythema or exudates. Oral hygiene is good. Tongue normal, non obstructing. Hearing intact.  Neck: Supple. Thyroid nl. Car 2+/2+ without bruits, nodes or JVD. Chest: Respirations nl with BS clear & equal w/o rales, rhonchi, wheezing or stridor.  Cor: Heart sounds normal w/ regular rate and rhythm without sig. murmurs, gallops, clicks, or rubs. Peripheral pulses normal and equal  without edema.  Abdomen: Soft & bowel sounds normal. Non-tender w/o guarding, rebound, hernias, masses, or organomegaly.  Lymphatics: Unremarkable.  Musculoskeletal: Full ROM all peripheral extremities, joint stability, 5/5 strength, and normal gait.  Skin: Warm, dry without exposed rashes, lesions or ecchymosis apparent.  Neuro: Cranial nerves intact, reflexes equal bilaterally. Sensory-motor testing grossly intact. Tendon reflexes grossly intact.  Pysch: Alert & oriented x 3.  Insight and judgement nl & appropriate. No ideations.  Assessment and Plan:  1. Essential hypertension  - TSH  2. Hyperlipidemia  - Lipid panel  3. T2_NIDDM  - Hemoglobin A1c - Insulin, random  4. Vitamin D deficiency  - Vit D  25 hydroxy   5. Testosterone Deficiency  - Testosterone  6. Morbid obesity (BMI 40.25)   7. BMI 40.0-44.9, adult   8. Medication management  - BASIC METABOLIC PANEL WITH GFR - CBC with Differential/Platelet - Hepatic function panel - Magnesium   Recommended  regular exercise, BP monitoring, weight control, and discussed med and SE's. Recommended labs to assess and monitor clinical status. Further disposition pending results of labs. Over 30 minutes of exam, counseling, chart review was performed

## 2015-01-26 NOTE — Patient Instructions (Signed)

## 2015-01-27 LAB — HEMOGLOBIN A1C
HEMOGLOBIN A1C: 6.3 % — AB (ref <5.7)
Mean Plasma Glucose: 134 mg/dL — ABNORMAL HIGH (ref <117)

## 2015-01-27 LAB — BASIC METABOLIC PANEL WITHOUT GFR
BUN: 25 mg/dL (ref 7–25)
CO2: 27 mmol/L (ref 20–31)
Calcium: 9.4 mg/dL (ref 8.6–10.3)
Chloride: 104 mmol/L (ref 98–110)
Creat: 0.9 mg/dL (ref 0.70–1.33)
GFR, Est African American: >89 mL/min
GFR, Est Non African American: >89 mL/min
Glucose, Bld: 93 mg/dL (ref 65–99)
Potassium: 4.1 mmol/L (ref 3.5–5.3)
Sodium: 139 mmol/L (ref 135–146)

## 2015-01-27 LAB — LIPID PANEL
Cholesterol: 172 mg/dL (ref 125–200)
HDL: 32 mg/dL — ABNORMAL LOW
LDL Cholesterol: 105 mg/dL
Total CHOL/HDL Ratio: 5.4 ratio — ABNORMAL HIGH
Triglycerides: 175 mg/dL — ABNORMAL HIGH
VLDL: 35 mg/dL — ABNORMAL HIGH

## 2015-01-27 LAB — INSULIN, RANDOM: Insulin: 60.6 u[IU]/mL — ABNORMAL HIGH (ref 2.0–19.6)

## 2015-01-27 LAB — CBC WITH DIFFERENTIAL/PLATELET
BASOS ABS: 0.1 10*3/uL (ref 0.0–0.1)
Basophils Relative: 1 % (ref 0–1)
Eosinophils Absolute: 0.3 10*3/uL (ref 0.0–0.7)
Eosinophils Relative: 3 % (ref 0–5)
HEMATOCRIT: 41.1 % (ref 39.0–52.0)
HEMOGLOBIN: 13.7 g/dL (ref 13.0–17.0)
LYMPHS ABS: 2.7 10*3/uL (ref 0.7–4.0)
LYMPHS PCT: 29 % (ref 12–46)
MCH: 30.2 pg (ref 26.0–34.0)
MCHC: 33.3 g/dL (ref 30.0–36.0)
MCV: 90.7 fL (ref 78.0–100.0)
MPV: 11.9 fL (ref 8.6–12.4)
Monocytes Absolute: 0.9 10*3/uL (ref 0.1–1.0)
Monocytes Relative: 10 % (ref 3–12)
NEUTROS ABS: 5.4 10*3/uL (ref 1.7–7.7)
NEUTROS PCT: 57 % (ref 43–77)
Platelets: 226 10*3/uL (ref 150–400)
RBC: 4.53 MIL/uL (ref 4.22–5.81)
RDW: 13 % (ref 11.5–15.5)
WBC: 9.4 10*3/uL (ref 4.0–10.5)

## 2015-01-27 LAB — HEPATIC FUNCTION PANEL
ALT: 24 U/L (ref 9–46)
AST: 19 U/L (ref 10–35)
Albumin: 4.5 g/dL (ref 3.6–5.1)
Alkaline Phosphatase: 85 U/L (ref 40–115)
Bilirubin, Direct: 0.1 mg/dL
Indirect Bilirubin: 0.4 mg/dL (ref 0.2–1.2)
Total Bilirubin: 0.5 mg/dL (ref 0.2–1.2)
Total Protein: 7.2 g/dL (ref 6.1–8.1)

## 2015-01-27 LAB — TESTOSTERONE: TESTOSTERONE: 143 ng/dL — AB (ref 300–890)

## 2015-01-27 LAB — MAGNESIUM: Magnesium: 2.1 mg/dL (ref 1.5–2.5)

## 2015-01-27 LAB — TSH: TSH: 2.335 u[IU]/mL (ref 0.350–4.500)

## 2015-01-27 LAB — VITAMIN D 25 HYDROXY (VIT D DEFICIENCY, FRACTURES): Vit D, 25-Hydroxy: 15 ng/mL — ABNORMAL LOW (ref 30–100)

## 2015-02-04 ENCOUNTER — Other Ambulatory Visit: Payer: Self-pay | Admitting: Physician Assistant

## 2015-02-04 DIAGNOSIS — K589 Irritable bowel syndrome without diarrhea: Secondary | ICD-10-CM

## 2015-02-09 ENCOUNTER — Other Ambulatory Visit: Payer: Self-pay | Admitting: Internal Medicine

## 2015-02-23 ENCOUNTER — Other Ambulatory Visit: Payer: Self-pay | Admitting: Internal Medicine

## 2015-03-10 ENCOUNTER — Other Ambulatory Visit: Payer: Self-pay | Admitting: Internal Medicine

## 2015-04-14 ENCOUNTER — Other Ambulatory Visit: Payer: Self-pay | Admitting: Physician Assistant

## 2015-04-24 ENCOUNTER — Other Ambulatory Visit: Payer: Self-pay | Admitting: Internal Medicine

## 2015-05-03 ENCOUNTER — Encounter: Payer: Self-pay | Admitting: Physician Assistant

## 2015-05-03 ENCOUNTER — Ambulatory Visit (INDEPENDENT_AMBULATORY_CARE_PROVIDER_SITE_OTHER): Payer: 59 | Admitting: Physician Assistant

## 2015-05-03 VITALS — BP 140/120 | HR 102 | Temp 97.7°F | Resp 16 | Ht 71.0 in | Wt 288.0 lb

## 2015-05-03 DIAGNOSIS — J0101 Acute recurrent maxillary sinusitis: Secondary | ICD-10-CM

## 2015-05-03 DIAGNOSIS — E785 Hyperlipidemia, unspecified: Secondary | ICD-10-CM | POA: Diagnosis not present

## 2015-05-03 DIAGNOSIS — E559 Vitamin D deficiency, unspecified: Secondary | ICD-10-CM | POA: Diagnosis not present

## 2015-05-03 DIAGNOSIS — I10 Essential (primary) hypertension: Secondary | ICD-10-CM

## 2015-05-03 DIAGNOSIS — E119 Type 2 diabetes mellitus without complications: Secondary | ICD-10-CM | POA: Diagnosis not present

## 2015-05-03 DIAGNOSIS — Z79899 Other long term (current) drug therapy: Secondary | ICD-10-CM

## 2015-05-03 LAB — CBC WITH DIFFERENTIAL/PLATELET
Basophils Absolute: 0.1 10*3/uL (ref 0.0–0.1)
Basophils Relative: 1 % (ref 0–1)
EOS ABS: 0.3 10*3/uL (ref 0.0–0.7)
Eosinophils Relative: 4 % (ref 0–5)
HCT: 41.7 % (ref 39.0–52.0)
HEMOGLOBIN: 14 g/dL (ref 13.0–17.0)
LYMPHS ABS: 2.1 10*3/uL (ref 0.7–4.0)
Lymphocytes Relative: 25 % (ref 12–46)
MCH: 30.3 pg (ref 26.0–34.0)
MCHC: 33.6 g/dL (ref 30.0–36.0)
MCV: 90.3 fL (ref 78.0–100.0)
MONO ABS: 0.8 10*3/uL (ref 0.1–1.0)
MONOS PCT: 9 % (ref 3–12)
MPV: 10.8 fL (ref 8.6–12.4)
NEUTROS ABS: 5.2 10*3/uL (ref 1.7–7.7)
NEUTROS PCT: 61 % (ref 43–77)
Platelets: 277 10*3/uL (ref 150–400)
RBC: 4.62 MIL/uL (ref 4.22–5.81)
RDW: 12.6 % (ref 11.5–15.5)
WBC: 8.5 10*3/uL (ref 4.0–10.5)

## 2015-05-03 MED ORDER — LISINOPRIL 10 MG PO TABS: 10.0000 mg | ORAL_TABLET | Freq: Every day | ORAL | Status: DC

## 2015-05-03 MED ORDER — FLUTICASONE PROPIONATE 50 MCG/ACT NA SUSP: 2.0000 | Freq: Every day | NASAL | Status: DC

## 2015-05-03 MED ORDER — AZITHROMYCIN 250 MG PO TABS: ORAL_TABLET | ORAL | Status: AC

## 2015-05-03 NOTE — Patient Instructions (Signed)
Your ears and sinuses are connected by the eustachian tube. When your sinuses are inflamed, this can close off the tube and cause fluid to collect in your middle ear. This can then cause dizziness, popping, clicking, ringing, and echoing in your ears. This is often NOT an infection and does NOT require antibiotics, it is caused by inflammation so the treatments help the inflammation. This can take a long time to get better so please be patient.  Here are things you can do to help with this: - Try the Flonase or Nasonex. Remember to spray each nostril twice towards the outer part of your eye.  Do not sniff but instead pinch your nose and tilt your head back to help the medicine get into your sinuses.  The best time to do this is at bedtime.Stop if you get blurred vision or nose bleeds.  -While drinking fluids, pinch and hold nose close and swallow, to help open eustachian tubes to drain fluid behind ear drums. -Please pick one of the over the counter allergy medications below and take it once daily for allergies.  It will also help with fluid behind ear drums. Claritin or loratadine cheapest but likely the weakest  Zyrtec or certizine at night because it can make you sleepy The strongest is allegra or fexafinadine  Cheapest at [employer], [employer], [employer] -can use decongestant over the counter, please do not use if you have high blood pressure or certain heart conditions.   if worsening HA, changes vision/speech, imbalance, weakness go to the ER    Monitor your blood pressure at home. Go to the ER if any CP, SOB, nausea, dizziness, severe HA, changes vision/speech  Goal BP:  For patients younger than 60: Goal BP < 140/90. For patients 60 and older: Goal BP < 150/90. For patients with diabetes: Goal BP < 140/90. Your most recent BP: BP: (!) 140/120 mmHg   Take your medications faithfully as instructed. Maintain a healthy weight. Get at least 150 minutes of aerobic exercise per week. Minimize salt  intake. Minimize alcohol intake  DASH Eating Plan DASH stands for "Dietary Approaches to Stop Hypertension." The DASH eating plan is a healthy eating plan that has been shown to reduce high blood pressure (hypertension). Additional health benefits may include reducing the risk of type 2 diabetes mellitus, heart disease, and stroke. The DASH eating plan may also help with weight loss. WHAT DO I NEED TO KNOW ABOUT THE DASH EATING PLAN? For the DASH eating plan, you will follow these general guidelines:  Choose foods with a percent daily value for sodium of less than 5% (as listed on the food label).  Use salt-free seasonings or herbs instead of table salt or sea salt.  Check with your health care provider or pharmacist before using salt substitutes.  Eat lower-sodium products, often labeled as "lower sodium" or "no salt added."  Eat fresh foods.  Eat more vegetables, fruits, and low-fat dairy products.  Choose whole grains. Look for the word "whole" as the first word in the ingredient list.  Choose fish and skinless chicken or [place] more often than red meat. Limit fish, poultry, and meat to 6 oz (170 g) each day.  Limit sweets, desserts, sugars, and sugary drinks.  Choose heart-healthy fats.  Limit cheese to 1 oz (28 g) per day.  Eat more home-cooked food and less restaurant, buffet, and fast food.  Limit fried foods.  Cook foods using methods other than frying.  Limit canned vegetables. If you do  use them, rinse them well to decrease the sodium.  When eating at a restaurant, ask that your food be prepared with less salt, or no salt if possible. WHAT FOODS CAN I EAT? Seek help from a dietitian for individual calorie needs. Grains Whole grain or whole wheat bread. Brown rice. Whole grain or whole wheat pasta. Quinoa, bulgur, and whole grain cereals. Low-sodium cereals. Corn or whole wheat flour tortillas. Whole grain cornbread. Whole grain crackers. Low-sodium  crackers. Vegetables Fresh or frozen vegetables (raw, steamed, roasted, or grilled). Low-sodium or reduced-sodium tomato and vegetable juices. Low-sodium or reduced-sodium tomato sauce and paste. Low-sodium or reduced-sodium canned vegetables.  Fruits All fresh, canned (in natural juice), or frozen fruits. Meat and Other Protein Products Ground beef (85% or leaner), grass-fed beef, or beef trimmed of fat. Skinless chicken or [place]. Ground chicken or [place]. Pork trimmed of fat. All fish and seafood. Eggs. Dried beans, peas, or lentils. Unsalted nuts and seeds. Unsalted canned beans. Dairy Low-fat dairy products, such as skim or 1% milk, 2% or reduced-fat cheeses, low-fat ricotta or cottage cheese, or plain low-fat yogurt. Low-sodium or reduced-sodium cheeses. Fats and Oils Tub margarines without trans fats. Light or reduced-fat mayonnaise and salad dressings (reduced sodium). Avocado. Safflower, olive, or canola oils. Natural peanut or almond butter. Other Unsalted popcorn and pretzels. The items listed above may not be a complete list of recommended foods or beverages. Contact your dietitian for more options. WHAT FOODS ARE NOT RECOMMENDED? Grains White bread. White pasta. White rice. Refined cornbread. Bagels and croissants. Crackers that contain trans fat. Vegetables Creamed or fried vegetables. Vegetables in a cheese sauce. Regular canned vegetables. Regular canned tomato sauce and paste. Regular tomato and vegetable juices. Fruits Dried fruits. Canned fruit in light or heavy syrup. Fruit juice. Meat and Other Protein Products Fatty cuts of meat. Ribs, chicken wings, bacon, sausage, bologna, salami, chitterlings, fatback, hot dogs, bratwurst, and packaged luncheon meats. Salted nuts and seeds. Canned beans with salt. Dairy Whole or 2% milk, cream, half-and-half, and cream cheese. Whole-fat or sweetened yogurt. Full-fat cheeses or blue cheese. Nondairy creamers and whipped toppings.  Processed cheese, cheese spreads, or cheese curds. Condiments Onion and garlic salt, seasoned salt, table salt, and sea salt. Canned and packaged gravies. Worcestershire sauce. Tartar sauce. Barbecue sauce. Teriyaki sauce. Soy sauce, including reduced sodium. Steak sauce. Fish sauce. Oyster sauce. Cocktail sauce. Horseradish. Ketchup and mustard. Meat flavorings and tenderizers. Bouillon cubes. Hot sauce. Tabasco sauce. Marinades. Taco seasonings. Relishes. Fats and Oils Butter, stick margarine, lard, shortening, ghee, and bacon fat. Coconut, palm kernel, or palm oils. Regular salad dressings. Other Pickles and olives. Salted popcorn and pretzels. The items listed above may not be a complete list of foods and beverages to avoid. Contact your dietitian for more information. WHERE CAN I FIND MORE INFORMATION? National [employer], [employer], [employer] Institute: travelstabl[employer].com Document Released: 04/11/2011 Document Revised: 09/06/2013 Document Reviewed: 02/24/2013 [facility] Patient Information 2015 [place], [place]. This information is not intended to replace advice given to you by your health care provider. Make sure you discuss any questions you have with your health care provider.

## 2015-05-03 NOTE — Progress Notes (Signed)
Assessment and Plan:  1. Hypertension -increase lisinopril to 66m, monitor at home, follow up 1 month, monitor blood pressure at home. Continue DASH diet.  Reminder to go to the ER if any CP, SOB, nausea, dizziness, severe HA, changes vision/speech, left arm numbness and tingling and jaw pain.  2. Cholesterol -Continue diet and exercise. Check cholesterol.   3. Diabetes without complications -Continue diet and exercise. Check A1C  4. Vitamin D Def - check level and continue medications.   5. Sinusitis Zpak, allergy pill, flonase  6. Obesity with co morbidities - long discussion about weight loss, diet, and exercise  Continue diet and meds as discussed. Further disposition pending results of labs. Discussed med's effects and SE's.    Over 30 minutes of exam, counseling, chart review, and critical decision making was performed   HPI 49y.o. male  presents for 3 month follow up on hypertension, cholesterol, diabetes and vitamin D deficiency.   His blood pressure has not been controlled at home, today his BP is BP: (!) 140/120 mmHg. Had normal stress test and echo 2014. No CV symptoms. He has had cold symptoms x 1 week, has been alk plus, decongestant, ibuprofen, has sore throat, sinus congestion, right ear pain.   He does workout. He denies chest pain, shortness of breath, dizziness.  He is on cholesterol medication and denies myalgias. His cholesterol is at goal. The cholesterol was:   Lab Results  Component Value Date   CHOL 172 01/26/2015   HDL 32* 01/26/2015   LDLCALC 105 01/26/2015   TRIG 175* 01/26/2015   CHOLHDL 5.4* 01/26/2015    He has been working on diet and exercise for diabetes without complications, he is not on bASA, he is on ACE/ARB, and denies  paresthesia of the feet, polydipsia, polyuria and visual disturbances. Last A1C was:  Lab Results  Component Value Date   HGBA1C 6.3* 01/26/2015    Patient is on Vitamin D supplement. Lab Results  Component Value  Date   VD25OH 15* 01/26/2015     Current Medications:  Current Outpatient Prescriptions on File Prior to Visit  Medication Sig Dispense Refill  . atorvastatin (LIPITOR) 20 MG tablet TAKE 1 TABLET BY MOUTH EVERY DAY 90 tablet 1  . citalopram (CELEXA) 40 MG tablet TAKE 1 TABLET BY MOUTH EVERY DAY FOR NERVES/STRESS 90 tablet 2  . dicyclomine (BENTYL) 20 MG tablet TAKE 1 TABLET BY MOUTH 3 TIMES A DAY AS NEEDED FOR CRAMPING, BLOATING, OR NAUSEA 90 tablet 0  . Multiple Vitamins-Minerals (MULTIVITAMIN WITH MINERALS) tablet Take 1 tablet by mouth daily.    .Marland KitchenOVER THE COUNTER MEDICATION Take 1 tablet by mouth daily. Vitamin D 2  1.25 mg    . ranitidine (ZANTAC) 300 MG tablet TAKE 1 TABLET TWICE A DAY 180 tablet 2  . Vitamin D, Ergocalciferol, (DRISDOL) 50000 UNITS CAPS capsule TAKE AS DIRECTED 30 capsule 5   No current facility-administered medications on file prior to visit.   Medical History:  Past Medical History  Diagnosis Date  . Hypertension   . Hypogonadism male   . Prediabetes   . Obese   . PND (post-nasal drip)   . SOB (shortness of breath) on exertion   . Chest pain   . Hyperlipidemia   . Anxiety   . Vitamin D deficiency   . Elevated hemoglobin A1c    Allergies:  Allergies  Allergen Reactions  . Bee Venom Anaphylaxis  . Chantix [Varenicline]      Review of  Systems:  Review of Systems  Constitutional: Negative for fever, chills, weight loss, malaise/fatigue and diaphoresis.  HENT: Positive for congestion, ear pain and sore throat. Negative for ear discharge, hearing loss, nosebleeds and tinnitus.   Eyes: Negative.  Negative for blurred vision.  Respiratory: Negative.  Negative for shortness of breath and stridor.   Cardiovascular: Negative.  Negative for chest pain.  Gastrointestinal: Negative.   Genitourinary: Negative.   Musculoskeletal: Negative.   Skin: Negative.   Neurological: Negative.  Negative for dizziness, weakness and headaches.  Psychiatric/Behavioral:  Negative.  Negative for depression.    Family history- Review and unchanged Social history- Review and unchanged Physical Exam: BP 140/120 mmHg  Pulse 102  Temp(Src) 97.7 F (36.5 C) (Temporal)  Resp 16  Ht '5\' 11"'  (1.803 m)  Wt 288 lb (130.636 kg)  BMI 40.19 kg/m2  SpO2 97% Wt Readings from Last 3 Encounters:  05/03/15 288 lb (130.636 kg)  01/26/15 292 lb (132.45 kg)  10/20/14 288 lb (130.636 kg)   General Appearance: Well nourished, in no apparent distress. Eyes: PERRLA, EOMs, conjunctiva no swelling or erythema Sinuses: + Frontal/maxillary tenderness ENT/Mouth: Ext aud canals clear, left TM without erythema, bulging. Right TM with dullness, bulging and erythema, no mastoid tenderness. No erythema, swelling, or exudate on post pharynx.  Tonsils not swollen or erythematous. Hearing normal.  Neck: Supple, thyroid normal.  Respiratory: Respiratory effort normal, BS equal bilaterally without rales, rhonchi, wheezing or stridor.  Cardio: RRR with no MRGs. Brisk peripheral pulses without edema.  Abdomen: Soft, + BS.  Non tender, no guarding, rebound, hernias, masses. Lymphatics: Non tender without lymphadenopathy.  Musculoskeletal: Full ROM, 5/5 strength, Normal gait Skin: Warm, dry without rashes, lesions, ecchymosis.  Neuro: Cranial nerves intact. No cerebellar symptoms.  Psych: Awake and oriented X 3, normal affect, Insight and Judgment appropriate.    Vicie Mutters, PA-C 3:47 PM [facility]

## 2015-05-04 LAB — BASIC METABOLIC PANEL WITH GFR
BUN: 19 mg/dL (ref 7–25)
CALCIUM: 9.2 mg/dL (ref 8.6–10.3)
CO2: 27 mmol/L (ref 20–31)
CREATININE: 0.83 mg/dL (ref 0.70–1.33)
Chloride: 103 mmol/L (ref 98–110)
GFR, Est Non African American: >89 mL/min (ref >=60)
Glucose, Bld: 139 mg/dL — ABNORMAL HIGH (ref 65–99)
Potassium: 4.1 mmol/L (ref 3.5–5.3)
SODIUM: 141 mmol/L (ref 135–146)

## 2015-05-04 LAB — HEMOGLOBIN A1C
Hgb A1c MFr Bld: 6.4 % — ABNORMAL HIGH (ref <5.7)
Mean Plasma Glucose: 137 mg/dL — ABNORMAL HIGH (ref <117)

## 2015-05-04 LAB — VITAMIN D 25 HYDROXY (VIT D DEFICIENCY, FRACTURES): VIT D 25 HYDROXY: 33 ng/mL (ref 30–100)

## 2015-05-04 LAB — HEPATIC FUNCTION PANEL
ALT: 23 U/L (ref 9–46)
AST: 15 U/L (ref 10–35)
Albumin: 4.3 g/dL (ref 3.6–5.1)
Alkaline Phosphatase: 73 U/L (ref 40–115)
BILIRUBIN DIRECT: 0.1 mg/dL (ref <=0.2)
BILIRUBIN TOTAL: 0.4 mg/dL (ref 0.2–1.2)
Indirect Bilirubin: 0.3 mg/dL (ref 0.2–1.2)
Total Protein: 7 g/dL (ref 6.1–8.1)

## 2015-05-04 LAB — MAGNESIUM: MAGNESIUM: 2 mg/dL (ref 1.5–2.5)

## 2015-05-04 LAB — LIPID PANEL
CHOL/HDL RATIO: 5.9 ratio — AB (ref <=5.0)
CHOLESTEROL: 171 mg/dL (ref 125–200)
HDL: 29 mg/dL — AB (ref >=40)
LDL Cholesterol: 111 mg/dL (ref <130)
TRIGLYCERIDES: 157 mg/dL — AB (ref <150)
VLDL: 31 mg/dL — ABNORMAL HIGH (ref <30)

## 2015-05-04 LAB — INSULIN, FASTING: Insulin fasting, serum: 132.3 u[IU]/mL — ABNORMAL HIGH (ref 2.0–19.6)

## 2015-05-04 LAB — TSH: TSH: 2.348 u[IU]/mL (ref 0.350–4.500)

## 2015-05-15 ENCOUNTER — Other Ambulatory Visit: Payer: Self-pay | Admitting: Internal Medicine

## 2015-05-15 ENCOUNTER — Other Ambulatory Visit: Payer: Self-pay | Admitting: Physician Assistant

## 2015-06-06 ENCOUNTER — Other Ambulatory Visit: Payer: Self-pay

## 2015-06-06 ENCOUNTER — Ambulatory Visit (INDEPENDENT_AMBULATORY_CARE_PROVIDER_SITE_OTHER): Payer: 59 | Admitting: Physician Assistant

## 2015-06-06 ENCOUNTER — Encounter: Payer: Self-pay | Admitting: Physician Assistant

## 2015-06-06 VITALS — BP 124/88 | HR 93 | Temp 97.4°F | Resp 16 | Ht 71.0 in | Wt 291.9 lb

## 2015-06-06 DIAGNOSIS — I10 Essential (primary) hypertension: Secondary | ICD-10-CM

## 2015-06-06 NOTE — Patient Instructions (Signed)
We want weight loss that will last so you should lose 1-2 pounds a week.  THAT IS IT! Please pick THREE things a month to change. Once it is a habit check off the item. Then pick another three items off the list to become habits.  If you are already doing a habit on the list GREAT!  Cross that item off! o Don't drink your calories. Ie, alcohol, soda, fruit juice, and sweet tea.  o Drink more water. Drink a glass when you feel hungry or before each meal.  o Eat breakfast - Complex carb and protein (likeDannon light and fit yogurt, oatmeal, fruit, eggs, turkey bacon). o Measure your cereal.  Eat no more than one cup a day. (ie Kashi) o Eat an apple a day. o Add a vegetable a day. o Try a new vegetable a month. o Use Pam! Stop using oil or butter to cook. o Don't finish your plate or use smaller plates. o Share your dessert. o Eat sugar free Jello for dessert or frozen grapes. o Don't eat 2-3 hours before bed. o Switch to whole wheat bread, pasta, and brown rice. o Make healthier choices when you eat out. No fries! o Pick baked chicken, NOT fried. o Don't forget to SLOW DOWN when you eat. It is not going anywhere.  o Take the stairs. o Park far away in the parking lot o Lift soup cans (or weights) for 10 minutes while watching TV. o Walk at work for 10 minutes during break. o Walk outside 1 time a week with your friend, kids, dog, or significant other. o Start a walking group at church. o Walk the mall as much as you can tolerate.  o Keep a food diary. o Weigh yourself daily. o Walk for 15 minutes 3 days per week. o Cook at home more often and eat out less.  If life happens and you go back to old habits, it is okay.  Just start over. You can do it!   If you experience chest pain, get short of breath, or tired during the exercise, please stop immediately and inform your doctor.   Before you even begin to attack a weight-loss plan, it pays to remember this: You are not fat. You have fat.  Losing weight isn't about blame or shame; it's simply another achievement to accomplish. Dieting is like any other skill-you have to buckle down and work at it. As long as you act in a smart, reasonable way, you'll ultimately get where you want to be. Here are some weight loss pearls for you.  1. It's Not a Diet. It's a Lifestyle Thinking of a diet as something you're on and suffering through only for the short term doesn't work. To shed weight and keep it off, you need to make permanent changes to the way you eat. It's OK to indulge occasionally, of course, but if you cut calories temporarily and then revert to your old way of eating, you'll gain back the weight quicker than you can say yo-yo. Use it to lose it. Research shows that one of the best predictors of long-term weight loss is how many pounds you drop in the first month. For that reason, nutritionists often suggest being stricter for the first two weeks of your new eating strategy to build momentum. Cut out added sugar and alcohol and avoid unrefined carbs. After that, figure out how you can reincorporate them in a way that's healthy and maintainable.  2. There's a Right   Way to Exercise Working out burns calories and fat and boosts your metabolism by building muscle. But those trying to lose weight are notorious for overestimating the number of calories they burn and underestimating the amount they take in. Unfortunately, your system is biologically programmed to hold on to extra pounds and that means when you start exercising, your body senses the deficit and ramps up its hunger signals. If you're not diligent, you'll eat everything you burn and then some. Use it to lose it. Cardio gets all the exercise glory, but strength and interval training are the real heroes. They help you build lean muscle, which in turn increases your metabolism and calorie-burning ability 3. Don't Overreact to Mild Hunger Some people have a hard time losing weight because  of hunger anxiety. To them, being hungry is bad-something to be avoided at all costs-so they carry snacks with them and eat when they don't need to. Others eat because they're stressed out or bored. While you never want to get to the point of being ravenous (that's when bingeing is likely to happen), a hunger pang, a craving, or the fact that it's 3:00 p.m. should not send you racing for the vending machine or obsessing about the energy bar in your purse. Ideally, you should put off eating until your stomach is growling and it's difficult to concentrate.  Use it to lose it. When you feel the urge to eat, use the HALT method. Ask yourself, Am I really hungry? Or am I angry or anxious, lonely or bored, or tired? If you're still not certain, try the apple test. If you're truly hungry, an apple should seem delicious; if it doesn't, something else is going on. Or you can try drinking water and making yourself busy, if you are still hungry try a healthy snack.  4. Not All Calories Are Created Equal The mechanics of weight loss are pretty simple: Take in fewer calories than you use for energy. But the kind of food you eat makes all the difference. Processed food that's high in saturated fat and refined starch or sugar can cause inflammation that disrupts the hormone signals that tell your brain you're full. The result: You eat a lot more.  Use it to lose it. Clean up your diet. Swap in whole, unprocessed foods, including vegetables, lean protein, and healthy fats that will fill you up and give you the biggest nutritional bang for your calorie buck. In a few weeks, as your brain starts receiving regular hunger and fullness signals once again, you'll notice that you feel less hungry overall and naturally start cutting back on the amount you eat.  5. Protein, Produce, and Plant-Based Fats Are Your Weight-Loss Trinity Here's why eating the three Ps regularly will help you drop pounds. Protein fills you up. You need it  to build lean muscle, which keeps your metabolism humming so that you can torch more fat. People in a weight-loss program who ate double the recommended daily allowance for protein (about 110 grams for a 150-pound woman) lost 70 percent of their weight from fat, while people who ate the RDA lost only about 40 percent, one study found. Produce is packed with filling fiber. "It's very difficult to consume too many calories if you're eating a lot of vegetables. Example: Three cups of broccoli is a lot of food, yet only 93 calories. (Fruit is another story. It can be easy to overeat and can contain a lot of calories from sugar, so be sure to   monitor your intake.) Plant-based fats like olive oil and those in avocados and nuts are healthy and extra satiating.  Use it to lose it. Aim to incorporate each of the three Ps into every meal and snack. People who eat protein throughout the day are able to keep weight off, according to a study in the American Journal of Clinical Nutrition. In addition to meat, poultry and seafood, good sources are beans, lentils, eggs, tofu, and yogurt. As for fat, keep portion sizes in check by measuring out salad dressing, oil, and nut butters (shoot for one to two tablespoons). Finally, eat veggies or a little fruit at every meal. People who did that consumed 308 fewer calories but didn't feel any hungrier than when they didn't eat more produce.  7. How You Eat Is As Important As What You Eat In order for your brain to register that you're full, you need to focus on what you're eating. Sit down whenever you eat, preferably at a table. Turn off the TV or computer, put down your phone, and look at your food. Smell it. Chew slowly, and don't put another bite on your fork until you swallow. When women ate lunch this attentively, they consumed 30 percent less when snacking later than those who listened to an audiobook at lunchtime, according to a study in the British Journal of Nutrition. 8.  Weighing Yourself Really Works The scale provides the best evidence about whether your efforts are paying off. Seeing the numbers tick up or down or stagnate is motivation to keep going-or to rethink your approach. A 2015 study at [employer] found that daily weigh-ins helped people lose more weight, keep it off, and maintain that loss, even after two years. Use it to lose it. Step on the scale at the same time every day for the best results. If your weight shoots up several pounds from one weigh-in to the next, don't freak out. Eating a lot of salt the night before or having your period is the likely culprit. The number should return to normal in a day or two. It's a steady climb that you need to do something about. 9. Too Much Stress and Too Little Sleep Are Your Enemies When you're tired and frazzled, your body cranks up the production of cortisol, the stress hormone that can cause carb cravings. Not getting enough sleep also boosts your levels of ghrelin, a hormone associated with hunger, while suppressing leptin, a hormone that signals fullness and satiety. People on a diet who slept only five and a half hours a night for two weeks lost 55 percent less fat and were hungrier than those who slept eight and a half hours, according to a study in the [employer] Journal. Use it to lose it. Prioritize sleep, aiming for seven hours or more a night, which research shows helps lower stress. And make sure you're getting quality zzz's. If a snoring spouse or a fidgety cat wakes you up frequently throughout the night, you may end up getting the equivalent of just four hours of sleep, according to a study from [employer]. Keep pets out of the bedroom, and use a white-noise app to drown out snoring. 10. You Will Hit a plateau-And You Can Bust Through It As you slim down, your body releases much less leptin, the fullness hormone.  If you're not strength training, start right now.  Building muscle can raise your metabolism to help you overcome a plateau. To keep your body challenged   and burning calories, incorporate new moves and more intense intervals into your workouts or add another sweat session to your weekly routine. Alternatively, cut an extra 100 calories or so a day from your diet. Now that you've lost weight, your body simply doesn't need as much fuel.   Ways to cut 100 calories  1. Eat your eggs with hot sauce OR salsa instead of cheese.  Eggs are great for breakfast, but many people consider eggs and cheese to be BFFs. Instead of cheese-1 oz. of cheddar has 114 calories-top your eggs with hot sauce, which contains no calories and helps with satiety and metabolism. Salsa is also a great option!!  2. Top your toast, waffles or pancakes with mashed berries instead of jelly or syrup. Half a cup of berries-fresh, frozen or thawed-has about 40 calories, compared with 2 tbsp. of maple syrup or jelly, which both have about 100 calories. The berries will also give you a good punch of fiber, which helps keep you full and satisfied and won't spike blood sugar quickly like the jelly or syrup. 3. Swap the non-fat latte for black coffee with a splash of half-and-half. Contrary to its name, that non-fat latte has 130 calories and a startling 19g of carbohydrates per 16 oz. serving. Replacing that 'light' drinkable dessert with a black coffee with a splash of half-and-half saves you more than 100 calories per 16 oz. serving. 4. Sprinkle salads with freeze-dried raspberries instead of dried cranberries. If you want a sweet addition to your nutritious salad, stay away from dried cranberries. They have a whopping 130 calories per  cup and 30g carbohydrates. Instead, sprinkle freeze-dried raspberries guilt-free and save more than 100 calories per  cup serving, adding 3g of belly-filling fiber. 5. Go for mustard in place of mayo on your sandwich. Mustard can add really nice flavor to any  sandwich, and there are tons of varieties, from spicy to honey. A serving of mayo is 95 calories, versus 10 calories in a serving of mustard. 6. Choose a DIY salad dressing instead of the store-bought kind. Mix Dijon or whole grain mustard with low-fat Kefir or red wine vinegar and garlic. 7. Use hummus as a spread instead of a dip. Use hummus as a spread on a high-fiber cracker or tortilla with a sandwich and save on calories without sacrificing taste. 8. Pick just one salad "accessory." Salad isn't automatically a calorie winner. It's easy to over-accessorize with toppings. Instead of topping your salad with nuts, avocado and cranberries (all three will clock in at 313 calories), just pick one. The next day, choose a different accessory, which will also keep your salad interesting. You don't wear all your jewelry every day, right? 9. Ditch the white pasta in favor of spaghetti squash. One cup of cooked spaghetti squash has about 40 calories, compared with traditional spaghetti, which comes with more than 200. Spaghetti squash is also nutrient-dense. It's a good source of fiber and Vitamins A and C, and it can be eaten just like you would eat pasta-with a great tomato sauce and turkey meatballs or with pesto, tofu and spinach, for example. 10. Dress up your chili, soups and stews with non-fat Greek yogurt instead of sour cream. Just a 'dollop' of sour cream can set you back 115 calories and a whopping 12g of fat-seven of which are of the artery-clogging variety. Added bonus: Greek yogurt is packed with muscle-building protein, calcium and B Vitamins. 11. Mash cauliflower instead of mashed potatoes. One cup of   traditional mashed potatoes-in all their creamy goodness-has more than 200 calories, compared to mashed cauliflower, which you can typically eat for less than 100 calories per 1 cup serving. Cauliflower is a great source of the antioxidant indole-3-carbinol (I3C), which may help reduce the risk of  some cancers, like breast cancer. 12. Ditch the ice cream sundae in favor of a Greek yogurt parfait. Instead of a cup of ice cream or fro-yo for dessert, try 1 cup of nonfat Greek yogurt topped with fresh berries and a sprinkle of cacao nibs. Both toppings are packed with antioxidants, which can help reduce cellular inflammation and oxidative damage. And the comparison is a no-brainer: One cup of ice cream has about 275 calories; one cup of frozen yogurt has about 230; and a cup of Greek yogurt has just 130, plus twice the protein, so you're less likely to return to the freezer for a second helping. 13. Put olive oil in a spray container instead of using it directly from the bottle. Each tablespoon of olive oil is 120 calories and 15g of fat. Use a mister instead of pouring it straight into the pan or onto a salad. This allows for portion control and will save you more than 100 calories. 14. When baking, substitute canned pumpkin for butter or oil. Canned pumpkin-not pumpkin pie mix-is loaded with Vitamin A, which is important for skin and eye health, as well as immunity. And the comparisons are pretty crazy:  cup of canned pumpkin has about 40 calories, compared to butter or oil, which has more than 800 calories. Yes, 800 calories. Applesauce and mashed banana can also serve as good substitutions for butter or oil, usually in a 1:1 ratio. 15. Top casseroles with high-fiber cereal instead of breadcrumbs. Breadcrumbs are typically made with white bread, while breakfast cereals contain 5-9g of fiber per serving. Not only will you save more than 150 calories per  cup serving, the swap will also keep you more full and you'll get a metabolism boost from the added fiber. 16. Snack on pistachios instead of macadamia nuts. Believe it or not, you get the same amount of calories from 35 pistachios (100 calories) as you would from only five macadamia nuts. 17. Chow down on kale chips rather than potato  chips. This is my favorite 'don't knock it 'till you try it' swap. Kale chips are so easy to make at home, and you can spice them up with a little grated parmesan or chili powder. Plus, they're a mere fraction of the calories of potato chips, but with the same crunch factor we crave so often. 18. Add seltzer and some fruit slices to your cocktail instead of soda or fruit juice. One cup of soda or fruit juice can pack on as much as 140 calories. Instead, use seltzer and fruit slices. The fruit provides valuable phytochemicals, such as flavonoids and anthocyanins, which help to combat cancer and stave off the aging process.  

## 2015-06-06 NOTE — Progress Notes (Signed)
Assessment and Plan: HTN- continue medication the same Obesity- weight loss discussed  Future Appointments Date Time Provider [place]  07/20/2015 3:00 PM Unk Pinto, MD [facility] None    HPI 50 y.o.male presents for 1 month follow up for HTN.  His lisinopril was increase last visit due to elevated BP, today his BP is BP: 124/88 mmHg  He also is off decongestant, etc. No AEs with increase the lisinopril. Cold resolved.   Lab Results  Component Value Date   HGBA1C 6.4* 05/03/2015  BMI is Body mass index is 40.73 kg/(m^2)., he is working on diet and exercise. Has joined [employer].  Wt Readings from Last 3 Encounters:  06/06/15 291 lb 14.4 oz (132.405 kg)  05/03/15 288 lb (130.636 kg)  01/26/15 292 lb (132.45 kg)      Past Medical History  Diagnosis Date  . Hypertension   . Hypogonadism male   . Prediabetes   . Obese   . PND (post-nasal drip)   . SOB (shortness of breath) on exertion   . Chest pain   . Hyperlipidemia   . Anxiety   . Vitamin D deficiency   . Elevated hemoglobin A1c      Allergies  Allergen Reactions  . Bee Venom Anaphylaxis  . Chantix [Varenicline]       Current Outpatient Prescriptions on File Prior to Visit  Medication Sig Dispense Refill  . atorvastatin (LIPITOR) 20 MG tablet TAKE 1 TABLET BY MOUTH EVERY DAY 90 tablet 1  . citalopram (CELEXA) 40 MG tablet TAKE 1 TABLET BY MOUTH EVERY DAY FOR NERVES/STRESS 90 tablet 2  . dicyclomine (BENTYL) 20 MG tablet TAKE 1 TABLET BY MOUTH 3 TIMES A DAY AS NEEDED FOR CRAMPING, BLOATING, OR NAUSEA 90 tablet 0  . fluticasone (FLONASE) 50 MCG/ACT nasal spray Place 2 sprays into both nostrils at bedtime. 16 g 1  . lisinopril (PRINIVIL,ZESTRIL) 10 MG tablet Take 1 tablet (10 mg total) by mouth daily. 90 tablet 0  . Multiple Vitamins-Minerals (MULTIVITAMIN WITH MINERALS) tablet Take 1 tablet by mouth daily.    Marland Kitchen OVER THE COUNTER MEDICATION Take 1 tablet by mouth daily. Vitamin D 2  1.25 mg    .  ranitidine (ZANTAC) 300 MG tablet TAKE 1 TABLET TWICE A DAY 180 tablet 2  . Vitamin D, Ergocalciferol, (DRISDOL) 50000 UNITS CAPS capsule TAKE AS DIRECTED 30 capsule 5   No current facility-administered medications on file prior to visit.    ROS: all negative except above.   Physical Exam: Filed Weights   06/06/15 1552  Weight: 291 lb 14.4 oz (132.405 kg)   BP 124/88 mmHg  Pulse 93  Temp(Src) 97.4 F (36.3 C) (Temporal)  Resp 16  Ht 5\' 11"  (1.803 m)  Wt 291 lb 14.4 oz (132.405 kg)  BMI 40.73 kg/m2  SpO2 98% General Appearance: Well nourished, in no apparent distress. Eyes: PERRLA, EOMs, conjunctiva no swelling or erythema Sinuses: No Frontal/maxillary tenderness ENT/Mouth: Ext aud canals clear, TMs without erythema, bulging. No erythema, swelling, or exudate on post pharynx.  Tonsils not swollen or erythematous. Hearing normal.  Neck: Supple, thyroid normal.  Respiratory: Respiratory effort normal, BS equal bilaterally without rales, rhonchi, wheezing or stridor.  Cardio: RRR with no MRGs. Brisk peripheral pulses without edema.  Abdomen: Soft, + BS.  Non tender, no guarding, rebound, hernias, masses. Lymphatics: Non tender without lymphadenopathy.  Musculoskeletal: Full ROM, 5/5 strength, normal gait.  Skin: Warm, dry without rashes, lesions, ecchymosis.  Neuro: Cranial nerves intact. Normal  muscle tone, no cerebellar symptoms. Sensation intact.  Psych: Awake and oriented X 3, normal affect, Insight and Judgment appropriate.     Vicie Mutters, PA-C 4:19 PM [facility]

## 2015-07-20 ENCOUNTER — Ambulatory Visit (INDEPENDENT_AMBULATORY_CARE_PROVIDER_SITE_OTHER): Payer: 59 | Admitting: Internal Medicine

## 2015-07-20 ENCOUNTER — Encounter: Payer: Self-pay | Admitting: Internal Medicine

## 2015-07-20 VITALS — BP 140/88 | HR 72 | Temp 97.3°F | Resp 16 | Ht 69.5 in | Wt 284.6 lb

## 2015-07-20 DIAGNOSIS — Z111 Encounter for screening for respiratory tuberculosis: Secondary | ICD-10-CM | POA: Diagnosis not present

## 2015-07-20 DIAGNOSIS — E291 Testicular hypofunction: Secondary | ICD-10-CM | POA: Diagnosis not present

## 2015-07-20 DIAGNOSIS — I10 Essential (primary) hypertension: Secondary | ICD-10-CM

## 2015-07-20 DIAGNOSIS — E559 Vitamin D deficiency, unspecified: Secondary | ICD-10-CM

## 2015-07-20 DIAGNOSIS — E785 Hyperlipidemia, unspecified: Secondary | ICD-10-CM | POA: Diagnosis not present

## 2015-07-20 DIAGNOSIS — Z125 Encounter for screening for malignant neoplasm of prostate: Secondary | ICD-10-CM

## 2015-07-20 DIAGNOSIS — R6889 Other general symptoms and signs: Secondary | ICD-10-CM | POA: Diagnosis not present

## 2015-07-20 DIAGNOSIS — E119 Type 2 diabetes mellitus without complications: Secondary | ICD-10-CM

## 2015-07-20 DIAGNOSIS — Z79899 Other long term (current) drug therapy: Secondary | ICD-10-CM | POA: Diagnosis not present

## 2015-07-20 DIAGNOSIS — Z1212 Encounter for screening for malignant neoplasm of rectum: Secondary | ICD-10-CM

## 2015-07-20 DIAGNOSIS — R5383 Other fatigue: Secondary | ICD-10-CM

## 2015-07-20 DIAGNOSIS — Z0001 Encounter for general adult medical examination with abnormal findings: Secondary | ICD-10-CM

## 2015-07-20 NOTE — Patient Instructions (Signed)

## 2015-07-20 NOTE — Progress Notes (Signed)
Patient ID: Ryan Nguyen, male   DOB: [birth year], 50 y.o.   MRN: [id number]  Annual  Screening/Preventative Visit And Comprehensive Evaluation & Examination  This very nice 50 y.o.male presents for a Wellness/Preventative Visit & comprehensive evaluation and management of multiple medical co-morbidities.  Patient has been followed for HTN, T2_NIDDM  , Hyperlipidemia and Vitamin D Deficiency.   HTN predates since 1999. Patient's BP has been controlled at home.Today's BP: 140/88 mmHg. Patient denies any cardiac symptoms as chest pain, palpitations, shortness of breath, dizziness or ankle swelling.   Patient's hyperlipidemia is not controlled with non compliant diet despite medications. Patient denies myalgias or other medication SE's. Last lipids were not at goal with  Cholesterol 171; HDL 29*; LDL 111; and Triglycerides 157 on 05/03/2015.   Patient now  has T2_NIDDM which he is attempting to manage with diet since 2011 when his A1c was 5.9% and patient denies reactive hypoglycemic symptoms, visual blurring, diabetic polys or paresthesias. Last A1c was  6.4% on 05/03/2015.    Finally, patient has history of Vitamin D Deficiency of "16" in 2008 and he sporadically supplements with his last vitamin D still very low at 33 on 05/03/2015.    Medication Sig  . atorvastatin  20 MG tablet TAKE 1 TABLET BY MOUTH EVERY DAY  . citalopram  40 MG tablet TAKE 1 TABLET BY MOUTH EVERY DAY FOR NERVES/STRESS  . dicyclomine  20 MG tablet TAKE 1 TABLET BY MOUTH 3 TIMES A DAY AS NEEDED FOR CRAMPING, BLOATING, OR NAUSEA  . FLONASE nasal spray Place 2 sprays into both nostrils at bedtime.  Marland Kitchen lisinopril  10 MG tablet Take 1 tablet (10 mg total) by mouth daily.  . Multiple Vitamins-Minerals  Take 1 tablet by mouth daily.  . vitamin D3 1000 Take 1 tablet by mouth daily. Takes  units daily.  . ranitidine  300 MG tablet TAKE 1 TABLET TWICE A DAY   Allergies  Allergen Reactions  . Bee Venom Anaphylaxis  . Chantix  [Varenicline]    Past Medical History  Diagnosis Date  . Hypertension   . Hypogonadism male   . Prediabetes   . Obese   . PND (post-nasal drip)   . SOB (shortness of breath) on exertion   . Chest pain   . Hyperlipidemia   . Anxiety   . Vitamin D deficiency   . Elevated hemoglobin A1c    Health Maintenance  Topic Date Due  . Hepatitis C Screening  04/12/1964  . OPHTHALMOLOGY EXAM  04/18/1974  . HIV Screening  04/19/1979  . FOOT EXAM  11/19/2014  . INFLUENZA VACCINE  12/05/2014  . HEMOGLOBIN A1C  11/01/2015  . COLONOSCOPY  10/14/2019  . TETANUS/TDAP  05/05/2022  . PNEUMOCOCCAL POLYSACCHARIDE VACCINE  Completed   Immunization History  Administered Date(s) Administered  . Influenza Split 02/01/2013, 03/11/2014  . PPD Test 11/18/2013, 07/18/2014  . Pneumococcal Polysaccharide-23 11/05/2012  . Pneumococcal-Unspecified 05/06/1998  . Td 05/06/2001  . Tdap 05/05/2012   Past Surgical History  Procedure Laterality Date  . Squamous cell carcinoma excision  2004  . Nasal ostea    . Left heart catheterization with coronary angiogram N/A 02/19/2013    Procedure: LEFT HEART CATHETERIZATION WITH CORONARY ANGIOGRAM;  Surgeon: Josue Hector, MD;  Location: [facility] CATH LAB;  Service: Cardiovascular;  Laterality: N/A;   Family History  Problem Relation Age of Onset  . Hypertension Mother   . COPD Mother   . Diabetes Sister   . Cancer Father  Lung cancer  . Colon cancer Neg Hx    Social History   Social History  . Marital Status: Married    Spouse Name: N/A  . Number of Children: 2  . Years of Education: N/A   Occupational History  . Not on file.   Social History Main Topics  . Smoking status: Former Smoker    Quit date: 05/01/2012  . Smokeless tobacco: Never Used  . Alcohol Use: 0.0 oz/week    0 Standard drinks or equivalent per week     Comment: very little  . Drug Use: No  . Sexual Activity: Active    ROS Constitutional: Denies fever, chills, weight loss/gain,  headaches, insomnia,  night sweats or change in appetite. Does c/o fatigue. Eyes: Denies redness, blurred vision, diplopia, discharge, itchy or watery eyes.  ENT: Denies discharge, congestion, post nasal drip, epistaxis, sore throat, earache, hearing loss, dental pain, Tinnitus, Vertigo, Sinus pain or snoring.  Cardio: Denies chest pain, palpitations, irregular heartbeat, syncope, dyspnea, diaphoresis, orthopnea, PND, claudication or edema Respiratory: denies cough, dyspnea, DOE, pleurisy, hoarseness, laryngitis or wheezing.  Gastrointestinal: Denies dysphagia, heartburn, reflux, water brash, pain, cramps, nausea, vomiting, bloating, diarrhea, constipation, hematemesis, melena, hematochezia, jaundice or hemorrhoids Genitourinary: Denies dysuria, frequency, urgency, nocturia, hesitancy, discharge, hematuria or flank pain Musculoskeletal: Denies arthralgia, myalgia, stiffness, Jt. Swelling, pain, limp or strain/sprain. Denies Falls. Skin: Denies puritis, rash, hives, warts, acne, eczema or change in skin lesion Neuro: No weakness, tremor, incoordination, spasms, paresthesia or pain Psychiatric: Denies confusion, memory loss or sensory loss. Denies Depression. Endocrine: Denies change in weight, skin, hair change, nocturia, and paresthesia, diabetic polys, visual blurring or hyper / hypo glycemic episodes.  Heme/Lymph: No excessive bleeding, bruising or enlarged lymph nodes.  Physical Exam  BP 140/88 mmHg  Pulse 72  Temp(Src) 97.3 F (36.3 C)  Resp 16  Ht 5' 9.5" (1.765 m)  Wt 284 lb 9.6 oz (129.094 kg)  BMI 41.44 kg/m2  General Appearance: Well nourished, in no apparent distress. Eyes: PERRLA, EOMs, conjunctiva no swelling or erythema, normal fundi and vessels. Sinuses: No frontal/maxillary tenderness ENT/Mouth: EACs patent / TMs  nl. Nares clear without erythema, swelling, mucoid exudates. Oral hygiene is good. No erythema, swelling, or exudate. Tongue normal, non-obstructing. Tonsils not  swollen or erythematous. Hearing normal.  Neck: Supple, thyroid normal. No bruits, nodes or JVD. Respiratory: Respiratory effort normal.  BS equal and clear bilateral without rales, rhonci, wheezing or stridor. Cardio: Heart sounds are normal with regular rate and rhythm and no murmurs, rubs or gallops. Peripheral pulses are normal and equal bilaterally without edema. No aortic or femoral bruits. Chest: symmetric with normal excursions and percussion.  Abdomen: Soft, with Nl bowel sounds. Nontender, no guarding, rebound, hernias, masses, or organomegaly.  Lymphatics: Non tender without lymphadenopathy.  Genitourinary: No hernias.Testes nl. DRE - prostate nl for age - smooth & firm w/o nodules. Musculoskeletal: Full ROM all peripheral extremities, joint stability, 5/5 strength, and normal gait. Skin: Warm and dry without rashes, lesions, cyanosis, clubbing or  ecchymosis.  Neuro: Cranial nerves intact, reflexes equal bilaterally. Normal muscle tone, no cerebellar symptoms. Sensation intact.  Pysch: Alert and oriented X 3 with normal affect, insight and judgment appropriate.   Assessment and Plan  1. Annual Preventative/Screening Exam   - Microalbumin / creatinine urine ratio - EKG 12-Lead - [place], RETROPERITNL ABD,  LTD - POC Hemoccult Bld/Stl  - Urinalysis, Routine w reflex microscopic  - Vitamin B12 - Iron and TIBC - PSA - Testosterone -  CBC with Differential/Platelet - BASIC METABOLIC PANEL WITH GFR - Hepatic function panel - Magnesium - Lipid panel - TSH - Hemoglobin A1c - Insulin, random - VITAMIN D 25 Hydroxy   2. Essential hypertension  - Microalbumin / creatinine urine ratio - EKG 12-Lead - [place], RETROPERITNL ABD,  LTD - TSH  3. Hyperlipidemia  - Lipid panel - TSH  4. T2_NIDDM  - Hemoglobin A1c - Insulin, random  5. Vitamin D deficiency  - VITAMIN D 25 Hydroxy   6. Testosterone Deficiency  - Testosterone  7. Screening for rectal cancer  - POC Hemoccult  Bld/Stl   8. Prostate cancer screening  - PSA  9. Other fatigue  - Vitamin B12 - Iron and TIBC - Testosterone - CBC with Differential/Platelet - TSH  10. Medication management  - Urinalysis, Routine w reflex microscopic  - CBC with Differential/Platelet - BASIC METABOLIC PANEL WITH GFR - Hepatic function panel - Magnesium   Continue prudent diet as discussed, weight control, BP monitoring, regular exercise, and medications as discussed.  Discussed med effects and SE's. Routine screening labs and tests as requested with regular follow-up as recommended. Over 40 minutes of exam, counseling, chart review and high complex critical decision making was performed

## 2015-07-21 LAB — HEPATIC FUNCTION PANEL
ALK PHOS: 77 U/L (ref 40–115)
ALT: 30 U/L (ref 9–46)
AST: 20 U/L (ref 10–35)
Albumin: 4.5 g/dL (ref 3.6–5.1)
BILIRUBIN DIRECT: 0.1 mg/dL (ref <=0.2)
BILIRUBIN TOTAL: 0.6 mg/dL (ref 0.2–1.2)
Indirect Bilirubin: 0.5 mg/dL (ref 0.2–1.2)
Total Protein: 6.9 g/dL (ref 6.1–8.1)

## 2015-07-21 LAB — CBC WITH DIFFERENTIAL/PLATELET
BASOS ABS: 0.1 10*3/uL (ref 0.0–0.1)
BASOS PCT: 1 % (ref 0–1)
EOS ABS: 0.2 10*3/uL (ref 0.0–0.7)
Eosinophils Relative: 3 % (ref 0–5)
HCT: 41 % (ref 39.0–52.0)
Hemoglobin: 13.8 g/dL (ref 13.0–17.0)
Lymphocytes Relative: 33 % (ref 12–46)
Lymphs Abs: 2.4 10*3/uL (ref 0.7–4.0)
MCH: 30.7 pg (ref 26.0–34.0)
MCHC: 33.7 g/dL (ref 30.0–36.0)
MCV: 91.1 fL (ref 78.0–100.0)
MPV: 11.5 fL (ref 8.6–12.4)
Monocytes Absolute: 0.7 10*3/uL (ref 0.1–1.0)
Monocytes Relative: 9 % (ref 3–12)
NEUTROS PCT: 54 % (ref 43–77)
Neutro Abs: 3.9 10*3/uL (ref 1.7–7.7)
PLATELETS: 227 10*3/uL (ref 150–400)
RBC: 4.5 MIL/uL (ref 4.22–5.81)
RDW: 13.1 % (ref 11.5–15.5)
WBC: 7.3 10*3/uL (ref 4.0–10.5)

## 2015-07-21 LAB — BASIC METABOLIC PANEL WITH GFR
BUN: 19 mg/dL (ref 7–25)
CHLORIDE: 103 mmol/L (ref 98–110)
CO2: 25 mmol/L (ref 20–31)
CREATININE: 0.89 mg/dL (ref 0.70–1.33)
Calcium: 9.3 mg/dL (ref 8.6–10.3)
GFR, Est African American: >89 mL/min (ref >=60)
GFR, Est Non African American: >89 mL/min (ref >=60)
Glucose, Bld: 111 mg/dL — ABNORMAL HIGH (ref 65–99)
POTASSIUM: 4.2 mmol/L (ref 3.5–5.3)
SODIUM: 140 mmol/L (ref 135–146)

## 2015-07-21 LAB — LIPID PANEL
CHOL/HDL RATIO: 4.4 ratio (ref <=5.0)
Cholesterol: 159 mg/dL (ref 125–200)
HDL: 36 mg/dL — AB (ref >=40)
LDL Cholesterol: 90 mg/dL (ref <130)
TRIGLYCERIDES: 163 mg/dL — AB (ref <150)
VLDL: 33 mg/dL — ABNORMAL HIGH (ref <30)

## 2015-07-21 LAB — URINALYSIS, ROUTINE W REFLEX MICROSCOPIC
Bilirubin Urine: NEGATIVE
HGB URINE DIPSTICK: NEGATIVE
KETONES UR: NEGATIVE
Leukocytes, UA: NEGATIVE
NITRITE: NEGATIVE
PH: 5.5 (ref 5.0–8.0)
Protein, ur: NEGATIVE
Specific Gravity, Urine: 1.028 (ref 1.001–1.035)

## 2015-07-21 LAB — VITAMIN B12: Vitamin B-12: 302 pg/mL (ref 200–1100)

## 2015-07-21 LAB — URINALYSIS, MICROSCOPIC ONLY
BACTERIA UA: NONE SEEN [HPF]
Casts: NONE SEEN [LPF]
Crystals: NONE SEEN [HPF]
Squamous Epithelial / LPF: NONE SEEN [HPF] (ref <=5)
WBC UA: NONE SEEN WBC/HPF (ref <=5)
Yeast: NONE SEEN [HPF]

## 2015-07-21 LAB — MICROALBUMIN / CREATININE URINE RATIO
CREATININE, URINE: 261 mg/dL (ref 20–370)
MICROALB/CREAT RATIO: 3 ug/mg{creat} (ref <30)
Microalb, Ur: 0.9 mg/dL

## 2015-07-21 LAB — MAGNESIUM: MAGNESIUM: 2.1 mg/dL (ref 1.5–2.5)

## 2015-07-21 LAB — PSA: PSA: 0.19 ng/mL (ref <=4.00)

## 2015-07-21 LAB — IRON AND TIBC
%SAT: 29 % (ref 15–60)
IRON: 77 ug/dL (ref 50–180)
TIBC: 269 ug/dL (ref 250–425)
UIBC: 192 ug/dL (ref 125–400)

## 2015-07-21 LAB — TESTOSTERONE: TESTOSTERONE: 202 ng/dL — AB (ref 250–827)

## 2015-07-21 LAB — HEMOGLOBIN A1C
HEMOGLOBIN A1C: 6.4 % — AB (ref <5.7)
Mean Plasma Glucose: 137 mg/dL — ABNORMAL HIGH (ref <117)

## 2015-07-21 LAB — INSULIN, RANDOM: Insulin: 97.4 u[IU]/mL — ABNORMAL HIGH (ref 2.0–19.6)

## 2015-07-21 LAB — VITAMIN D 25 HYDROXY (VIT D DEFICIENCY, FRACTURES): VIT D 25 HYDROXY: 18 ng/mL — AB (ref 30–100)

## 2015-07-21 LAB — TSH: TSH: 2.48 mIU/L (ref 0.40–4.50)

## 2015-07-25 ENCOUNTER — Other Ambulatory Visit: Payer: Self-pay | Admitting: Internal Medicine

## 2015-07-28 ENCOUNTER — Other Ambulatory Visit: Payer: Self-pay | Admitting: Physician Assistant

## 2015-10-18 ENCOUNTER — Encounter: Payer: Self-pay | Admitting: Internal Medicine

## 2015-10-18 NOTE — Patient Instructions (Signed)

## 2015-10-18 NOTE — Progress Notes (Signed)
Patient ID: Ryan Nguyen, male   DOB: 24-Feb-1966, 50 y.o.   MRN: [id number]  [facility] ADOL[facility]IC[facility]   Unk Pinto, M.D.    Uvaldo Bristle. Silverio Lay, P.A.-C      Starlyn Skeans, P.A.-C   [facility]                [address] [place], [place] [id number] Telephone [phone] Telefax [phone] _________________________________  Annual  Screening/Preventative Visit And Comprehensive Evaluation & Examination     This very nice 50 y.o. MWM presents for a Wellness/Preventative Visit & comprehensive evaluation and management of multiple medical co-morbidities.  Patient has been followed for HTN, T2_NIDDM, Hyperlipidemia and Vitamin D Deficiency. Today he's also c/o discolored thickened toenails.     HTN predates circa 1999. Patient's BP has been controlled at home.Today's BP: 138/88 mmHg. Patient denies any cardiac symptoms as chest pain, palpitations, shortness of breath, dizziness or ankle swelling.     Patient's hyperlipidemia is controlled with diet and medications. Patient denies myalgias or other medication SE's. Last lipids were at goal with  Cholesterol 159; HDL 36*; LDL 90; Triglycerides 163 on 07/20/2015.     Patient has T2_NIDDM with A1c 5.9% in 2011 & then 6.4% in Dec 2016 and he has been attempting management by diet . Patient denies reactive hypoglycemic symptoms, visual blurring, diabetic polys or paresthesias. Last A1c was 6.4%  on 07/20/2015.     Finally, patient has history of Vitamin D Deficiency of  "22" in 2008 and "52" in Dec 2016 and last vitamin D was "18" on 07/20/2015.     Medication Sig  . atorvastatin  20 MG TAKE 1 TAB EVERY DAY  . citalopram  40 MG TAKE 1 TAB EVERY DAY FOR NERVES/STRESS  . dicyclomine  20 MG  TAKE 1 TAB 3 TIMES A DAY AS NEEDED FOR CRAMPING, BLOATING, OR NAUSEA  . FLONASE nasal spray Place 2 sprays into both nostrils at bedtime.  . Lisinopril 10 MG  TAKE 1 TAB DAILY.  .  MultiVit-Min Take 1 tabdaily.  . vitamin D3 1000 units Take 1 tab daily  . ranitidine  300 MG  TAKE 1 TAB TWICE A DAY  . Vitamin D 50000 units  TAKES 1 Tablet daily   Allergies  Allergen Reactions  . Bee Venom Anaphylaxis  . Chantix [Varenicline]    Past Medical History  Diagnosis Date  . Hypertension   . Hypogonadism male   . Prediabetes   . Obese   . PND (post-nasal drip)   . SOB (shortness of breath) on exertion   . Chest pain   . Hyperlipidemia   . Anxiety   . Vitamin D deficiency   . Elevated hemoglobin A1c    Health Maintenance  Topic Date Due  . Hepatitis C Screening  Nov 30, 1965  . OPHTHALMOLOGY EXAM  04/18/1974  . HIV Screening  04/19/1979  . FOOT EXAM  11/19/2014  . INFLUENZA VACCINE  12/05/2015  . HEMOGLOBIN A1C  01/20/2016  . COLONOSCOPY  10/14/2019  . TETANUS/TDAP  05/05/2022  . PNEUMOCOCCAL POLYSACCHARIDE VACCINE  Completed   Immunization History  Administered Date(s) Administered  . Influenza Split 02/01/2013, 03/11/2014  . PPD Test 11/18/2013, 07/18/2014, 07/20/2015  . Pneumococcal Polysaccharide-23 11/05/2012  . Pneumococcal-Unspecified 05/06/1998  . Td 05/06/2001  . Tdap 05/05/2012   Past Surgical History  Procedure Date  . Squamous cell carcinoma  excision 2004  . Nasal ostea   . Left heart catheterization with coronary angiogramPeter Tommy Rainwater, MD 02/19/2013   Family History  Problem Relation Age of Onset  . Hypertension Mother   . COPD Mother   . Diabetes Sister   . Cancer Father     Lung cancer  . Colon cancer Neg Hx    Social History  . Marital Status: Married    Spouse Name: N/A  . Number of Children: 2  . Years of Education: N/A   Occupational History  . 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 Smoking status: Former Smoker    Quit date: 05/01/2012  . Smokeless tobacco: Never Used  . Alcohol Use: 0.0 oz/week     Comment: very little  . Drug Use: No  . Sexual Activity: Active     ROS Constitutional: Denies fever, chills, weight loss/gain, headaches, insomnia,  night sweats or change in appetite. Does c/o fatigue. Eyes: Denies redness, blurred vision, diplopia, discharge, itchy or watery eyes.  ENT: Denies discharge, congestion, post nasal drip, epistaxis, sore throat, earache, hearing loss, dental pain, Tinnitus, Vertigo, Sinus pain or snoring.  Cardio: Denies chest pain, palpitations, irregular heartbeat, syncope, dyspnea, diaphoresis, orthopnea, PND, claudication or edema Respiratory: denies cough, dyspnea, DOE, pleurisy, hoarseness, laryngitis or wheezing.  Gastrointestinal: Denies dysphagia, heartburn, reflux, water brash, pain, cramps, nausea, vomiting, bloating, diarrhea, constipation, hematemesis, melena, hematochezia, jaundice or hemorrhoids Genitourinary: Denies dysuria, frequency, urgency, nocturia, hesitancy, discharge, hematuria or flank pain Musculoskeletal: Denies arthralgia, myalgia, stiffness, Jt. Swelling, pain, limp or strain/sprain. Denies Falls. Skin: Denies puritis, rash, hives, warts, acne, eczema or change in skin lesion Neuro: No weakness, tremor, incoordination, spasms, paresthesia or pain Psychiatric: Denies confusion, memory loss or sensory loss. Denies Depression. Endocrine: Denies change in weight, skin, hair change, nocturia, and paresthesia, diabetic polys, visual blurring or hyper / hypo glycemic episodes.  Heme/Lymph: No excessive bleeding, bruising or enlarged lymph nodes.  Physical Exam  BP 138/88 mmHg  Pulse 80  Temp(Src) 97.4 F (36.3 C)  Resp 16  Ht 5' 8.5" (1.74 m)  Wt 287 lb 3.2 oz (130.273 kg)  BMI 43.03 kg/m2  General Appearance: Well nourished, in no apparent distress.  Eyes: PERRLA, EOMs, conjunctiva no swelling or erythema, normal fundi and vessels. Sinuses: No frontal/maxillary tenderness ENT/Mouth: EACs patent / TMs  nl. Nares clear without erythema, swelling, mucoid exudates. Oral hygiene is good. No erythema,  swelling, or exudate. Tongue normal, non-obstructing. Tonsils not swollen or erythematous. Hearing normal.  Neck: Supple, thyroid normal. No bruits, nodes or JVD. Respiratory: Respiratory effort normal.  BS equal and clear bilateral without rales, rhonci, wheezing or stridor. Cardio: Heart sounds are normal with regular rate and rhythm and no murmurs, rubs or gallops. Peripheral pulses are normal and equal bilaterally without edema. No aortic or femoral bruits. Chest: symmetric with normal excursions and percussion.  Abdomen: Soft, with Nl bowel sounds. Nontender, no guarding, rebound, hernias, masses, or organomegaly.  Lymphatics: Non tender without lymphadenopathy.  Genitourinary: Deferred - done 3 months ago & WNL. Musculoskeletal: Full ROM all peripheral extremities, joint stability, 5/5 strength, and normal gait. Skin: Warm and dry without rashes, lesions, cyanosis, clubbing or  ecchymosis. (+) dystrophic yellowish thickened toenails. Neuro: Cranial nerves intact, reflexes equal bilaterally. Normal muscle tone, no cerebellar symptoms. Sensation intact.  Pysch: Alert and oriented X 3 with normal affect, insight and judgment appropriate.   Assessment and Plan  1. Annual Preventative/Screening Exam  2. Essential hypertension  - EKG 12-Lead - TSH  3. Hyperlipidemia  - Lipid panel - TSH  4. T2_NIDDM  - Microalbumin / creatinine urine ratio - Hemoglobin A1c - Insulin, random  5. Vitamin D deficiency  - VITAMIN D 25 Hydroxy  6. Testosterone Deficiency  - Testosterone  7. Morbid obesity due to excess calories ([place])   8. Screening for rectal cancer  - POC Hemoccult Bld/Stl   9. Prostate cancer screening  - PSA  10. Other fatigue  - Vitamin B12 - Iron and TIBC - Testosterone - CBC with Differential/Platelet  11. Medication management  - Urinalysis, Routine w reflex microscopic - CBC with Differential/Platelet - BASIC METABOLIC PANEL WITH GFR - Hepatic  function panel - Magnesium  12. Screening for ischemic heart disease   13. Onychomycosis Toenails   - Rx- Lamisil 250 mg #90  - Lab visit 67 weeks to recheck LFT's    Continue prudent diet as discussed, weight control, BP monitoring, regular exercise, and medications as discussed.  Discussed med effects and SE's. Routine screening labs and tests as requested with regular follow-up as recommended. Over 40 minutes of exam, counseling, chart review and high complex critical decision making was performed

## 2015-10-19 ENCOUNTER — Ambulatory Visit (INDEPENDENT_AMBULATORY_CARE_PROVIDER_SITE_OTHER): Payer: 59 | Admitting: Internal Medicine

## 2015-10-19 ENCOUNTER — Telehealth: Payer: Self-pay | Admitting: *Deleted

## 2015-10-19 ENCOUNTER — Encounter: Payer: Self-pay | Admitting: Internal Medicine

## 2015-10-19 VITALS — BP 138/88 | HR 80 | Temp 97.4°F | Resp 16 | Ht 68.5 in | Wt 287.2 lb

## 2015-10-19 DIAGNOSIS — Z125 Encounter for screening for malignant neoplasm of prostate: Secondary | ICD-10-CM | POA: Diagnosis not present

## 2015-10-19 DIAGNOSIS — E785 Hyperlipidemia, unspecified: Secondary | ICD-10-CM

## 2015-10-19 DIAGNOSIS — E291 Testicular hypofunction: Secondary | ICD-10-CM

## 2015-10-19 DIAGNOSIS — E559 Vitamin D deficiency, unspecified: Secondary | ICD-10-CM

## 2015-10-19 DIAGNOSIS — Z0000 Encounter for general adult medical examination without abnormal findings: Secondary | ICD-10-CM

## 2015-10-19 DIAGNOSIS — I10 Essential (primary) hypertension: Secondary | ICD-10-CM | POA: Diagnosis not present

## 2015-10-19 DIAGNOSIS — Z136 Encounter for screening for cardiovascular disorders: Secondary | ICD-10-CM | POA: Diagnosis not present

## 2015-10-19 DIAGNOSIS — Z79899 Other long term (current) drug therapy: Secondary | ICD-10-CM

## 2015-10-19 DIAGNOSIS — Z1212 Encounter for screening for malignant neoplasm of rectum: Secondary | ICD-10-CM

## 2015-10-19 DIAGNOSIS — R5383 Other fatigue: Secondary | ICD-10-CM

## 2015-10-19 DIAGNOSIS — Z0001 Encounter for general adult medical examination with abnormal findings: Secondary | ICD-10-CM

## 2015-10-19 DIAGNOSIS — E119 Type 2 diabetes mellitus without complications: Secondary | ICD-10-CM

## 2015-10-19 LAB — CBC WITH DIFFERENTIAL/PLATELET
BASOS PCT: 1 %
Basophils Absolute: 70 cells/uL (ref 0–200)
EOS PCT: 3 %
Eosinophils Absolute: 210 cells/uL (ref 15–500)
HCT: 41.6 % (ref 38.5–50.0)
HEMOGLOBIN: 13.9 g/dL (ref 13.2–17.1)
LYMPHS ABS: 2240 {cells}/uL (ref 850–3900)
Lymphocytes Relative: 32 %
MCH: 30.2 pg (ref 27.0–33.0)
MCHC: 33.4 g/dL (ref 32.0–36.0)
MCV: 90.2 fL (ref 80.0–100.0)
MONOS PCT: 7 %
MPV: 11.4 fL (ref 7.5–12.5)
Monocytes Absolute: 490 cells/uL (ref 200–950)
NEUTROS ABS: 3990 {cells}/uL (ref 1500–7800)
Neutrophils Relative %: 57 %
PLATELETS: 219 10*3/uL (ref 140–400)
RBC: 4.61 MIL/uL (ref 4.20–5.80)
RDW: 12.9 % (ref 11.0–15.0)
WBC: 7 10*3/uL (ref 3.8–10.8)

## 2015-10-19 LAB — VITAMIN B12: Vitamin B-12: 331 pg/mL (ref 200–1100)

## 2015-10-19 LAB — TSH: TSH: 1.98 m[IU]/L (ref 0.40–4.50)

## 2015-10-19 NOTE — Telephone Encounter (Signed)
PPD negative per patient. 

## 2015-10-20 LAB — TESTOSTERONE: TESTOSTERONE: 117 ng/dL — AB (ref 250–827)

## 2015-10-20 LAB — LIPID PANEL
CHOL/HDL RATIO: 4.2 ratio (ref <=5.0)
Cholesterol: 154 mg/dL (ref 125–200)
HDL: 37 mg/dL — AB (ref >=40)
LDL Cholesterol: 98 mg/dL (ref <130)
Triglycerides: 95 mg/dL (ref <150)
VLDL: 19 mg/dL (ref <30)

## 2015-10-20 LAB — BASIC METABOLIC PANEL WITH GFR
BUN: 23 mg/dL (ref 7–25)
CO2: 21 mmol/L (ref 20–31)
CREATININE: 0.87 mg/dL (ref 0.70–1.33)
Calcium: 9.2 mg/dL (ref 8.6–10.3)
Chloride: 107 mmol/L (ref 98–110)
GFR, Est African American: >89 mL/min (ref >=60)
Glucose, Bld: 138 mg/dL — ABNORMAL HIGH (ref 65–99)
POTASSIUM: 4.3 mmol/L (ref 3.5–5.3)
SODIUM: 137 mmol/L (ref 135–146)

## 2015-10-20 LAB — INSULIN, RANDOM: Insulin: 98.8 u[IU]/mL — ABNORMAL HIGH (ref 2.0–19.6)

## 2015-10-20 LAB — URINALYSIS, ROUTINE W REFLEX MICROSCOPIC
Bilirubin Urine: NEGATIVE
HGB URINE DIPSTICK: NEGATIVE
KETONES UR: NEGATIVE
LEUKOCYTES UA: NEGATIVE
NITRITE: NEGATIVE
PH: 5.5 (ref 5.0–8.0)
PROTEIN: NEGATIVE
Specific Gravity, Urine: 1.023 (ref 1.001–1.035)

## 2015-10-20 LAB — HEPATIC FUNCTION PANEL
ALK PHOS: 83 U/L (ref 40–115)
ALT: 27 U/L (ref 9–46)
AST: 17 U/L (ref 10–35)
Albumin: 4.5 g/dL (ref 3.6–5.1)
BILIRUBIN DIRECT: 0.1 mg/dL (ref <=0.2)
BILIRUBIN INDIRECT: 0.5 mg/dL (ref 0.2–1.2)
BILIRUBIN TOTAL: 0.6 mg/dL (ref 0.2–1.2)
Total Protein: 7.1 g/dL (ref 6.1–8.1)

## 2015-10-20 LAB — IRON AND TIBC
%SAT: 35 % (ref 15–60)
IRON: 98 ug/dL (ref 50–180)
TIBC: 282 ug/dL (ref 250–425)
UIBC: 184 ug/dL (ref 125–400)

## 2015-10-20 LAB — MICROALBUMIN / CREATININE URINE RATIO
CREATININE, URINE: 142 mg/dL (ref 20–370)
MICROALB UR: 0.3 mg/dL
Microalb Creat Ratio: 2 mcg/mg creat (ref <30)

## 2015-10-20 LAB — MAGNESIUM: MAGNESIUM: 2.1 mg/dL (ref 1.5–2.5)

## 2015-10-20 LAB — HEMOGLOBIN A1C
Hgb A1c MFr Bld: 6.6 % — ABNORMAL HIGH (ref <5.7)
MEAN PLASMA GLUCOSE: 143 mg/dL

## 2015-10-20 LAB — VITAMIN D 25 HYDROXY (VIT D DEFICIENCY, FRACTURES): Vit D, 25-Hydroxy: 46 ng/mL (ref 30–100)

## 2015-10-20 LAB — PSA: PSA: 0.21 ng/mL (ref <=4.00)

## 2015-10-25 ENCOUNTER — Ambulatory Visit: Payer: Self-pay | Admitting: Internal Medicine

## 2015-11-30 ENCOUNTER — Other Ambulatory Visit: Payer: 59

## 2015-11-30 ENCOUNTER — Other Ambulatory Visit: Payer: Self-pay | Admitting: Internal Medicine

## 2015-11-30 DIAGNOSIS — Z79899 Other long term (current) drug therapy: Secondary | ICD-10-CM

## 2015-11-30 DIAGNOSIS — B351 Tinea unguium: Secondary | ICD-10-CM

## 2015-11-30 LAB — HEPATIC FUNCTION PANEL
ALBUMIN: 4.5 g/dL (ref 3.6–5.1)
ALT: 24 U/L (ref 9–46)
AST: 17 U/L (ref 10–35)
Alkaline Phosphatase: 82 U/L (ref 40–115)
BILIRUBIN TOTAL: 0.6 mg/dL (ref 0.2–1.2)
Bilirubin, Direct: 0.1 mg/dL (ref <=0.2)
Indirect Bilirubin: 0.5 mg/dL (ref 0.2–1.2)
TOTAL PROTEIN: 7 g/dL (ref 6.1–8.1)

## 2015-12-26 ENCOUNTER — Other Ambulatory Visit: Payer: Self-pay | Admitting: Internal Medicine

## 2016-01-29 ENCOUNTER — Ambulatory Visit (INDEPENDENT_AMBULATORY_CARE_PROVIDER_SITE_OTHER): Payer: 59 | Admitting: Internal Medicine

## 2016-01-29 ENCOUNTER — Encounter: Payer: Self-pay | Admitting: Internal Medicine

## 2016-01-29 ENCOUNTER — Other Ambulatory Visit: Payer: Self-pay | Admitting: Internal Medicine

## 2016-01-29 VITALS — BP 126/88 | HR 92 | Temp 97.3°F | Resp 16 | Ht 69.5 in | Wt 281.4 lb

## 2016-01-29 DIAGNOSIS — E785 Hyperlipidemia, unspecified: Secondary | ICD-10-CM | POA: Diagnosis not present

## 2016-01-29 DIAGNOSIS — E119 Type 2 diabetes mellitus without complications: Secondary | ICD-10-CM

## 2016-01-29 DIAGNOSIS — B351 Tinea unguium: Secondary | ICD-10-CM | POA: Diagnosis not present

## 2016-01-29 DIAGNOSIS — I10 Essential (primary) hypertension: Secondary | ICD-10-CM

## 2016-01-29 DIAGNOSIS — E559 Vitamin D deficiency, unspecified: Secondary | ICD-10-CM

## 2016-01-29 DIAGNOSIS — K219 Gastro-esophageal reflux disease without esophagitis: Secondary | ICD-10-CM

## 2016-01-29 DIAGNOSIS — Z79899 Other long term (current) drug therapy: Secondary | ICD-10-CM | POA: Diagnosis not present

## 2016-01-29 LAB — BASIC METABOLIC PANEL WITH GFR
BUN: 17 mg/dL (ref 7–25)
CHLORIDE: 104 mmol/L (ref 98–110)
CO2: 26 mmol/L (ref 20–31)
Calcium: 9.3 mg/dL (ref 8.6–10.3)
Creat: 0.94 mg/dL (ref 0.70–1.33)
GFR, Est African American: >89 mL/min (ref >=60)
GFR, Est Non African American: >89 mL/min (ref >=60)
Glucose, Bld: 98 mg/dL (ref 65–99)
POTASSIUM: 3.7 mmol/L (ref 3.5–5.3)
Sodium: 138 mmol/L (ref 135–146)

## 2016-01-29 LAB — CBC WITH DIFFERENTIAL/PLATELET
BASOS PCT: 1 %
Basophils Absolute: 94 cells/uL (ref 0–200)
EOS ABS: 94 {cells}/uL (ref 15–500)
Eosinophils Relative: 1 %
HCT: 41.3 % (ref 38.5–50.0)
Hemoglobin: 13.8 g/dL (ref 13.2–17.1)
LYMPHS ABS: 2350 {cells}/uL (ref 850–3900)
Lymphocytes Relative: 25 %
MCH: 30.4 pg (ref 27.0–33.0)
MCHC: 33.4 g/dL (ref 32.0–36.0)
MCV: 91 fL (ref 80.0–100.0)
MONO ABS: 658 {cells}/uL (ref 200–950)
MONOS PCT: 7 %
MPV: 11.4 fL (ref 7.5–12.5)
NEUTROS ABS: 6204 {cells}/uL (ref 1500–7800)
Neutrophils Relative %: 66 %
PLATELETS: 220 10*3/uL (ref 140–400)
RBC: 4.54 MIL/uL (ref 4.20–5.80)
RDW: 12.8 % (ref 11.0–15.0)
WBC: 9.4 10*3/uL (ref 3.8–10.8)

## 2016-01-29 LAB — TSH: TSH: 1.74 mIU/L (ref 0.40–4.50)

## 2016-01-29 LAB — HEPATIC FUNCTION PANEL
ALBUMIN: 4.5 g/dL (ref 3.6–5.1)
ALT: 19 U/L (ref 9–46)
AST: 16 U/L (ref 10–35)
Alkaline Phosphatase: 66 U/L (ref 40–115)
Bilirubin, Direct: 0.2 mg/dL (ref <=0.2)
Indirect Bilirubin: 0.6 mg/dL (ref 0.2–1.2)
Total Bilirubin: 0.8 mg/dL (ref 0.2–1.2)
Total Protein: 7 g/dL (ref 6.1–8.1)

## 2016-01-29 LAB — LIPID PANEL
CHOLESTEROL: 158 mg/dL (ref 125–200)
HDL: 42 mg/dL (ref >=40)
LDL Cholesterol: 99 mg/dL (ref <130)
Total CHOL/HDL Ratio: 3.8 Ratio (ref <=5.0)
Triglycerides: 85 mg/dL (ref <150)
VLDL: 17 mg/dL (ref <30)

## 2016-01-29 NOTE — Patient Instructions (Signed)

## 2016-01-29 NOTE — Progress Notes (Signed)
Patient ID: Ryan Nguyen, male   DOB: 1965-06-06, 50 y.o.   MRN: [id number]  Assessment and Plan:  Hypertension:  -Continue medication -monitor blood pressure at home. -Continue DASH diet -Reminder to go to the ER if any CP, SOB, nausea, dizziness, severe HA, changes vision/speech, left arm numbness and tingling and jaw pain.  Cholesterol - Continue diet and exercise -cholesterol  Diabetes with diabetic chronic kidney disease -Continue diet and exercise.  -Check A1C -not currently checking cbgs, with new workout regimen on board recommend carrying meter and glucagon with him at all times  Vitamin D Def -continue medications.   Onychomycosis -cont lamasil -check LFTs  Continue diet and meds as discussed. Further disposition pending results of labs. Discussed med's effects and SE's.    HPI 50 y.o. male  presents for 3 month follow up with hypertension, hyperlipidemia, diabetes and vitamin D deficiency.   His blood pressure has been controlled at home, today their BP is BP: 126/88.He does workout. He denies chest pain, shortness of breath, dizziness.  He reports that he is working out at [employer].  He is doing a lot of cardiovascular stuff and he does some circuits.     He is on cholesterol medication and denies myalgias. His cholesterol is at goal. The cholesterol was:  10/19/2015: Cholesterol 154; HDL 37; LDL Cholesterol 98; Triglycerides 95   He has been working on diet and exercise for diabetes with diabetic chronic kidney disease, he is on bASA, he is on ACE/ARB, and denies  foot ulcerations, hyperglycemia, hypoglycemia , increased appetite, nausea, paresthesia of the feet, polydipsia, polyuria, visual disturbances, vomiting and weight loss. Last A1C was: 10/19/2015: Hgb A1c MFr Bld 6.6.   Patient is on Vitamin D supplement. 10/19/2015: Vit D, 25-Hydroxy 46  He reports that he has been doing well with the lamisil.  He can note a difference in his toes.  He has been on  it now for 3 months.    Current Medications:  Current Outpatient Prescriptions on File Prior to Visit  Medication Sig Dispense Refill  . atorvastatin (LIPITOR) 20 MG tablet TAKE 1 TABLET BY MOUTH EVERY DAY 90 tablet 1  . citalopram (CELEXA) 40 MG tablet TAKE 1 TABLET BY MOUTH EVERY DAY FOR NERVES/STRESS 90 tablet 2  . dicyclomine (BENTYL) 20 MG tablet TAKE 1 TABLET BY MOUTH 3 TIMES A DAY AS NEEDED FOR CRAMPING, BLOATING, OR NAUSEA 90 tablet 0  . lisinopril (PRINIVIL,ZESTRIL) 10 MG tablet TAKE 1 TABLET (10 MG TOTAL) BY MOUTH DAILY. 90 tablet 0  . Multiple Vitamins-Minerals (MULTIVITAMIN WITH MINERALS) tablet Take 1 tablet by mouth daily.    . ranitidine (ZANTAC) 300 MG tablet TAKE 1 TABLET TWICE A DAY 180 tablet 2  . Vitamin D, Ergocalciferol, (DRISDOL) 50000 units CAPS capsule TAKE AS DIRECTED 90 capsule 1   No current facility-administered medications on file prior to visit.    Medical History:  Past Medical History:  Diagnosis Date  . Anxiety   . Chest pain   . Elevated hemoglobin A1c   . Hyperlipidemia   . Hypertension   . Hypogonadism male   . Obese   . PND (post-nasal drip)   . Prediabetes   . SOB (shortness of breath) on exertion   . Vitamin D deficiency    Allergies:  Allergies  Allergen Reactions  . Bee Venom Anaphylaxis  . Chantix [Varenicline]      Review of Systems:  ROS  Family history- Review and unchanged  Social history-  Review and unchanged  Physical Exam: BP 126/88   Pulse 92   Temp 97.3 F (36.3 C)   Resp 16   Ht 5' 9.5" (1.765 m)   Wt 281 lb 6.4 oz (127.6 kg)   BMI 40.96 kg/m  Wt Readings from Last 3 Encounters:  01/29/16 281 lb 6.4 oz (127.6 kg)  10/19/15 287 lb 3.2 oz (130.3 kg)  07/20/15 284 lb 9.6 oz (129.1 kg)   General Appearance: Well nourished well developed, non-toxic appearing, in no apparent distress. Eyes: PERRLA, EOMs, conjunctiva no swelling or erythema ENT/Mouth: Ear canals clear with no erythema, swelling, or discharge.   TMs normal bilaterally, oropharynx clear, moist, with no exudate.   Neck: Supple, thyroid normal, no JVD, no cervical adenopathy.  Respiratory: Respiratory effort normal, breath sounds clear A&P, no wheeze, rhonchi or rales noted.  No retractions, no accessory muscle usage Cardio: RRR with no MRGs. No noted edema.  Abdomen: Soft, + BS.  Non tender, no guarding, rebound, hernias, masses. Musculoskeletal: Full ROM, 5/5 strength, Normal gait Skin: Warm, dry without rashes, lesions, ecchymosis.  Neuro: Awake and oriented X 3, Cranial nerves intact. No cerebellar symptoms.  Psych: normal affect, Insight and Judgment appropriate.    Starlyn Skeans, PA-C 3:44 PM [facility]

## 2016-01-29 NOTE — Progress Notes (Signed)
[place] [facility] Unk Pinto, M.D.        Uvaldo Bristle. Silverio Lay, P.A.-C       Starlyn Skeans, P.A.-C  [facility]                [address] [place], [place] [id number] Telephone [phone] Telefax [phone] ______________________________________________________________________     This very nice 50 y.o. MWM presents for 3 month follow up with Hypertension, Hyperlipidemia, Pre-Diabetes and Vitamin D Deficiency.      Patient is treated for [id number])  & BP has been controlled at home. Today's  . Patient has had no complaints of any cardiac type chest pain, palpitations, dyspnea/orthopnea/PND, dizziness, claudication, or dependent edema.     Hyperlipidemia is controlled with diet & meds. Patient denies myalgias or other med SE's. Last Lipids were at goal: Lab Results  Component Value Date   CHOL 154 10/19/2015   HDL 37 (L) 10/19/2015   LDLCALC 98 10/19/2015   TRIG 95 10/19/2015   CHOLHDL 4.2 10/19/2015      Also, the patient has history of T2_NIDDM (1999) with A1c 5.9% in 2011 , then 6.4%  In 04/2015 and 07/2015 and has had no symptoms of reactive hypoglycemia, diabetic polys, paresthesias or visual blurring.  Last A1c was not at goal:  Lab Results  Component Value Date   HGBA1C 6.6 (H) 10/19/2015      Further, the patient also has history of Vitamin D Deficiency in 2008 of "16" and 33 in Dec 2016 and still low at "18" in Mar 2017 and supplements vitamin D without any suspected side-effects. Last vitamin D was still low:   Lab Results  Component Value Date   VD25OH 46 10/19/2015   Current Outpatient Prescriptions on File Prior to Visit  Medication Sig  . atorvastatin (LIPITOR) 20 MG tablet TAKE 1 TABLET BY MOUTH EVERY DAY  . citalopram (CELEXA) 40 MG tablet TAKE 1 TABLET BY MOUTH EVERY DAY FOR NERVES/STRESS  . dicyclomine (BENTYL) 20 MG tablet TAKE 1 TABLET BY MOUTH 3 TIMES A DAY AS NEEDED FOR  CRAMPING, BLOATING, OR NAUSEA  . fluticasone (FLONASE) 50 MCG/ACT nasal spray Place 2 sprays into both nostrils at bedtime.  Marland Kitchen lisinopril (PRINIVIL,ZESTRIL) 10 MG tablet TAKE 1 TABLET (10 MG TOTAL) BY MOUTH DAILY.  . Multiple Vitamins-Minerals (MULTIVITAMIN WITH MINERALS) tablet Take 1 tablet by mouth daily.  . ranitidine (ZANTAC) 300 MG tablet TAKE 1 TABLET TWICE A DAY  . Vitamin D, Ergocalciferol, (DRISDOL) 50000 units CAPS capsule TAKE AS DIRECTED   No current facility-administered medications on file prior to visit.    Allergies  Allergen Reactions  . Bee Venom Anaphylaxis  . Chantix [Varenicline]    PMHx:   Past Medical History:  Diagnosis Date  . Anxiety   . Chest pain   . Elevated hemoglobin A1c   . Hyperlipidemia   . Hypertension   . Hypogonadism male   . Obese   . PND (post-nasal drip)   . Prediabetes   . SOB (shortness of breath) on exertion   . Vitamin D deficiency    Immunization History  Administered Date(s) Administered  . Influenza Split 02/01/2013, 03/11/2014  . PPD Test 11/18/2013, 07/18/2014, 07/20/2015  . Pneumococcal Polysaccharide-23 11/05/2012  . Pneumococcal-Unspecified 05/06/1998  . Td 05/06/2001  . Tdap 05/05/2012   Past Surgical History:  Procedure Laterality Date  . LEFT HEART  CATHETERIZATION WITH CORONARY ANGIOGRAM N/A 02/19/2013   Procedure: LEFT HEART CATHETERIZATION WITH CORONARY ANGIOGRAM;  Surgeon: Josue Hector, MD;  Location: [facility] CATH LAB;  Service: Cardiovascular;  Laterality: N/A;  . nasal ostea    . SQUAMOUS CELL CARCINOMA EXCISION  2004   FHx:    Reviewed / unchanged  SHx:    Reviewed / unchanged  Systems Review:  Constitutional: Denies fever, chills, wt changes, headaches, insomnia, fatigue, night sweats, change in appetite. Eyes: Denies redness, blurred vision, diplopia, discharge, itchy, watery eyes.  ENT: Denies discharge, congestion, post nasal drip, epistaxis, sore throat, earache, hearing loss, dental pain, tinnitus,  vertigo, sinus pain, snoring.  CV: Denies chest pain, palpitations, irregular heartbeat, syncope, dyspnea, diaphoresis, orthopnea, PND, claudication or edema. Respiratory: denies cough, dyspnea, DOE, pleurisy, hoarseness, laryngitis, wheezing.  Gastrointestinal: Denies dysphagia, odynophagia, heartburn, reflux, water brash, abdominal pain or cramps, nausea, vomiting, bloating, diarrhea, constipation, hematemesis, melena, hematochezia  or hemorrhoids. Genitourinary: Denies dysuria, frequency, urgency, nocturia, hesitancy, discharge, hematuria or flank pain. Musculoskeletal: Denies arthralgias, myalgias, stiffness, jt. swelling, pain, limping or strain/sprain.  Skin: Denies pruritus, rash, hives, warts, acne, eczema or change in skin lesion(s). Neuro: No weakness, tremor, incoordination, spasms, paresthesia or pain. Psychiatric: Denies confusion, memory loss or sensory loss. Endo: Denies change in weight, skin or hair change.  Heme/Lymph: No excessive bleeding, bruising or enlarged lymph nodes.  Physical Exam There were no vitals taken for this visit.  Appears well nourished and in no distress.  Eyes: PERRLA, EOMs, conjunctiva no swelling or erythema. Sinuses: No frontal/maxillary tenderness ENT/Mouth: EAC's clear, TM's nl w/o erythema, bulging. Nares clear w/o erythema, swelling, exudates. Oropharynx clear without erythema or exudates. Oral hygiene is good. Tongue normal, non obstructing. Hearing intact.  Neck: Supple. Thyroid nl. Car 2+/2+ without bruits, nodes or JVD. Chest: Respirations nl with BS clear & equal w/o rales, rhonchi, wheezing or stridor.  Cor: Heart sounds normal w/ regular rate and rhythm without sig. murmurs, gallops, clicks, or rubs. Peripheral pulses normal and equal  without edema.  Abdomen: Soft & bowel sounds normal. Non-tender w/o guarding, rebound, hernias, masses, or organomegaly.  Lymphatics: Unremarkable.  Musculoskeletal: Full ROM all peripheral extremities,  joint stability, 5/5 strength, and normal gait.  Skin: Warm, dry without exposed rashes, lesions or ecchymosis apparent.  Neuro: Cranial nerves intact, reflexes equal bilaterally. Sensory-motor testing grossly intact. Tendon reflexes grossly intact.  Pysch: Alert & oriented x 3.  Insight and judgement nl & appropriate. No ideations.  Assessment and Plan:  - Continue medication, monitor blood pressure at home. Continue DASH diet. Reminder to go to the ER if any CP, SOB, nausea, dizziness, severe HA, changes vision/speech, left arm numbness and tingling and jaw pain. - Continue diet/meds, exercise,& lifestyle modifications. Continue monitor periodic cholesterol/liver & renal functions   - Continue diet, exercise, lifestyle modifications. Monitor appropriate labs. - Continue supplementation.  Recommended regular exercise, BP monitoring, weight control, and discussed med and SE's. Recommended labs to assess and monitor clinical status. Further disposition pending results of labs. Over 30 minutes of exam, counseling, chart review was performed

## 2016-01-30 LAB — HEMOGLOBIN A1C
HEMOGLOBIN A1C: 5.8 % — AB (ref <5.7)
MEAN PLASMA GLUCOSE: 120 mg/dL

## 2016-02-16 ENCOUNTER — Other Ambulatory Visit: Payer: Self-pay | Admitting: Internal Medicine

## 2016-03-06 ENCOUNTER — Other Ambulatory Visit: Payer: Self-pay | Admitting: Internal Medicine

## 2016-04-12 ENCOUNTER — Other Ambulatory Visit: Payer: Self-pay | Admitting: Internal Medicine

## 2016-05-01 ENCOUNTER — Ambulatory Visit: Payer: Self-pay | Admitting: Internal Medicine

## 2016-05-07 ENCOUNTER — Ambulatory Visit (INDEPENDENT_AMBULATORY_CARE_PROVIDER_SITE_OTHER): Payer: 59 | Admitting: Internal Medicine

## 2016-05-07 ENCOUNTER — Encounter: Payer: Self-pay | Admitting: Internal Medicine

## 2016-05-07 VITALS — BP 134/82 | HR 100 | Temp 98.2°F | Resp 16 | Ht 69.5 in | Wt 282.0 lb

## 2016-05-07 DIAGNOSIS — I10 Essential (primary) hypertension: Secondary | ICD-10-CM

## 2016-05-07 DIAGNOSIS — E559 Vitamin D deficiency, unspecified: Secondary | ICD-10-CM | POA: Diagnosis not present

## 2016-05-07 DIAGNOSIS — E119 Type 2 diabetes mellitus without complications: Secondary | ICD-10-CM | POA: Diagnosis not present

## 2016-05-07 DIAGNOSIS — Z79899 Other long term (current) drug therapy: Secondary | ICD-10-CM

## 2016-05-07 DIAGNOSIS — E782 Mixed hyperlipidemia: Secondary | ICD-10-CM | POA: Diagnosis not present

## 2016-05-07 DIAGNOSIS — F418 Other specified anxiety disorders: Secondary | ICD-10-CM

## 2016-05-07 NOTE — Progress Notes (Signed)
Assessment and Plan:  Hypertension:  -Continue medication -monitor blood pressure at home. -Continue DASH diet -Reminder to go to the ER if any CP, SOB, nausea, dizziness, severe HA, changes vision/speech, left arm numbness and tingling and jaw pain.  Cholesterol -cont lipitor - Continue diet and exercise -Check cholesterol.   Diabetes with diabetic chronic kidney disease  -currently controlled with diet and exercise -recommended increased compliance with his diet and exercise to avoid need for medications -Continue diet and exercise.  -Check A1C  Vitamin D Def -continue medications.   Situational anxiety -secondary to wife's recent melanoma surgery -does appear to be resolving.  Morbid obesity -recommended increased compliance with diet and exercise  Continue diet and meds as discussed. Further disposition pending results of labs. Discussed med's effects and SE's.    HPI 51 y.o. male  presents for 3 month follow up with hypertension, hyperlipidemia, diabetes and vitamin D deficiency.   His blood pressure has been controlled at home, today their BP is BP: 134/82.He does not workout. He denies chest pain, shortness of breath, dizziness.   He is on cholesterol medication and denies myalgias. His cholesterol is at goal. The cholesterol was:  01/29/2016: Cholesterol 158; HDL 42; LDL Cholesterol 99; Triglycerides 85   He has not been working on diet and exercise for diabetes without complications, he is on bASA, he is on ACE/ARB, and denies  foot ulcerations, hyperglycemia, hypoglycemia , increased appetite, nausea, paresthesia of the feet, polydipsia, polyuria, visual disturbances, vomiting and weight loss. Last A1C was: 01/29/2016: Hgb A1c MFr Bld 5.8.  His diabetes is currently controlled with diet.      Patient is on Vitamin D supplement. 10/19/2015: Vit D, 25-Hydroxy 31  He reports that recently his wife was diagnosed with melaonoma and they have been having multiple surgeries,  PET scans, etc.  She is doing well.  They found no further incidence of disease.  This has been a very stressful time.  He reports that he has been coping well.  He has been unable to exercise or eat healthy secondary to this stress.    Current Medications:  Current Outpatient Prescriptions on File Prior to Visit  Medication Sig Dispense Refill  . atorvastatin (LIPITOR) 20 MG tablet TAKE 1 TABLET BY MOUTH EVERY DAY 90 tablet 1  . citalopram (CELEXA) 40 MG tablet TAKE 1 TABLET BY MOUTH EVERY DAY FOR NERVES/STRESS 90 tablet 0  . dicyclomine (BENTYL) 20 MG tablet TAKE 1 TABLET BY MOUTH 3 TIMES A DAY AS NEEDED FOR CRAMPING, BLOATING, OR NAUSEA 90 tablet 0  . lisinopril (PRINIVIL,ZESTRIL) 10 MG tablet TAKE 1 TABLET (10 MG TOTAL) BY MOUTH DAILY. 90 tablet 0  . Multiple Vitamins-Minerals (MULTIVITAMIN WITH MINERALS) tablet Take 1 tablet by mouth daily.    . ranitidine (ZANTAC) 300 MG tablet TAKE 1 TABLET TWICE A DAY 180 tablet 1  . terbinafine (LAMISIL) 250 MG tablet TAKE ONE TABLET BY MOUTH ONCE DAILY FOR  TOENAIL  INFECTION 85 tablet 0  . Vitamin D, Ergocalciferol, (DRISDOL) 50000 units CAPS capsule TAKE AS DIRECTED 90 capsule 1   No current facility-administered medications on file prior to visit.    Medical History:  Past Medical History:  Diagnosis Date  . Anxiety   . Chest pain   . Elevated hemoglobin A1c   . Hyperlipidemia   . Hypertension   . Hypogonadism male   . Obese   . PND (post-nasal drip)   . Prediabetes   . SOB (shortness of breath) on  exertion   . Vitamin D deficiency    Allergies:  Allergies  Allergen Reactions  . Bee Venom Anaphylaxis  . Chantix [Varenicline]      Review of Systems:  ROS  Family history- Review and unchanged  Social history- Review and unchanged  Physical Exam: BP 134/82   Pulse 100   Temp 98.2 F (36.8 C) (Temporal)   Resp 16   Ht 5' 9.5" (1.765 m)   Wt 282 lb (127.9 kg)   BMI 41.05 kg/m  Wt Readings from Last 3 Encounters:   05/07/16 282 lb (127.9 kg)  01/29/16 281 lb 6.4 oz (127.6 kg)  10/19/15 287 lb 3.2 oz (130.3 kg)   General Appearance: Well nourished well developed, non-toxic appearing, in no apparent distress. Eyes: PERRLA, EOMs, conjunctiva no swelling or erythema ENT/Mouth: Ear canals clear with no erythema, swelling, or discharge.  TMs normal bilaterally, oropharynx clear, moist, with no exudate.   Neck: Supple, thyroid normal, no JVD, no cervical adenopathy.  Respiratory: Respiratory effort normal, breath sounds clear A&P, no wheeze, rhonchi or rales noted.  No retractions, no accessory muscle usage Cardio: RRR with no MRGs. No noted edema.  Abdomen: Soft, + BS.  Non tender, no guarding, rebound, hernias, masses. Musculoskeletal: Full ROM, 5/5 strength, Normal gait Skin: Warm, dry without rashes, lesions, ecchymosis.  Neuro: Awake and oriented X 3, Cranial nerves intact. No cerebellar symptoms.  Psych: normal affect, Insight and Judgment appropriate.    Starlyn Skeans, PA-C 3:44 PM [facility]

## 2016-06-04 ENCOUNTER — Other Ambulatory Visit: Payer: Self-pay | Admitting: Internal Medicine

## 2016-06-06 ENCOUNTER — Other Ambulatory Visit: Payer: Self-pay | Admitting: Internal Medicine

## 2016-08-20 ENCOUNTER — Other Ambulatory Visit: Payer: Self-pay | Admitting: Internal Medicine

## 2016-09-05 ENCOUNTER — Ambulatory Visit (INDEPENDENT_AMBULATORY_CARE_PROVIDER_SITE_OTHER): Payer: 59 | Admitting: Internal Medicine

## 2016-09-05 ENCOUNTER — Encounter: Payer: Self-pay | Admitting: Internal Medicine

## 2016-09-05 VITALS — BP 134/90 | HR 76 | Temp 97.3°F | Resp 16 | Ht 70.0 in | Wt 290.6 lb

## 2016-09-05 DIAGNOSIS — Z0000 Encounter for general adult medical examination without abnormal findings: Secondary | ICD-10-CM | POA: Diagnosis not present

## 2016-09-05 DIAGNOSIS — Z79899 Other long term (current) drug therapy: Secondary | ICD-10-CM | POA: Diagnosis not present

## 2016-09-05 DIAGNOSIS — I10 Essential (primary) hypertension: Secondary | ICD-10-CM | POA: Diagnosis not present

## 2016-09-05 DIAGNOSIS — R5383 Other fatigue: Secondary | ICD-10-CM

## 2016-09-05 DIAGNOSIS — E559 Vitamin D deficiency, unspecified: Secondary | ICD-10-CM

## 2016-09-05 DIAGNOSIS — Z125 Encounter for screening for malignant neoplasm of prostate: Secondary | ICD-10-CM | POA: Diagnosis not present

## 2016-09-05 DIAGNOSIS — E782 Mixed hyperlipidemia: Secondary | ICD-10-CM

## 2016-09-05 DIAGNOSIS — Z1212 Encounter for screening for malignant neoplasm of rectum: Secondary | ICD-10-CM

## 2016-09-05 DIAGNOSIS — Z111 Encounter for screening for respiratory tuberculosis: Secondary | ICD-10-CM

## 2016-09-05 DIAGNOSIS — E119 Type 2 diabetes mellitus without complications: Secondary | ICD-10-CM

## 2016-09-05 DIAGNOSIS — Z0001 Encounter for general adult medical examination with abnormal findings: Secondary | ICD-10-CM

## 2016-09-05 DIAGNOSIS — Z6841 Body Mass Index (BMI) 40.0 and over, adult: Secondary | ICD-10-CM

## 2016-09-05 DIAGNOSIS — Z136 Encounter for screening for cardiovascular disorders: Secondary | ICD-10-CM

## 2016-09-05 DIAGNOSIS — K219 Gastro-esophageal reflux disease without esophagitis: Secondary | ICD-10-CM

## 2016-09-05 DIAGNOSIS — G5621 Lesion of ulnar nerve, right upper limb: Secondary | ICD-10-CM

## 2016-09-05 LAB — LIPID PANEL
CHOL/HDL RATIO: 4.4 ratio (ref <5.0)
Cholesterol: 168 mg/dL (ref <200)
HDL: 38 mg/dL — ABNORMAL LOW (ref >40)
LDL CALC: 100 mg/dL — AB (ref <100)
Triglycerides: 152 mg/dL — ABNORMAL HIGH (ref <150)
VLDL: 30 mg/dL (ref <30)

## 2016-09-05 LAB — CBC WITH DIFFERENTIAL/PLATELET
BASOS ABS: 101 {cells}/uL (ref 0–200)
Basophils Relative: 1 %
EOS PCT: 3 %
Eosinophils Absolute: 303 cells/uL (ref 15–500)
HEMATOCRIT: 42.9 % (ref 38.5–50.0)
HEMOGLOBIN: 14.5 g/dL (ref 13.2–17.1)
LYMPHS ABS: 2727 {cells}/uL (ref 850–3900)
Lymphocytes Relative: 27 %
MCH: 30.8 pg (ref 27.0–33.0)
MCHC: 33.8 g/dL (ref 32.0–36.0)
MCV: 91.1 fL (ref 80.0–100.0)
MONO ABS: 808 {cells}/uL (ref 200–950)
MPV: 11.5 fL (ref 7.5–12.5)
Monocytes Relative: 8 %
NEUTROS ABS: 6161 {cells}/uL (ref 1500–7800)
Neutrophils Relative %: 61 %
Platelets: 234 10*3/uL (ref 140–400)
RBC: 4.71 MIL/uL (ref 4.20–5.80)
RDW: 12.8 % (ref 11.0–15.0)
WBC: 10.1 10*3/uL (ref 3.8–10.8)

## 2016-09-05 LAB — BASIC METABOLIC PANEL WITH GFR
BUN: 22 mg/dL (ref 7–25)
CHLORIDE: 102 mmol/L (ref 98–110)
CO2: 25 mmol/L (ref 20–31)
Calcium: 9.8 mg/dL (ref 8.6–10.3)
Creat: 1.2 mg/dL (ref 0.70–1.33)
GFR, EST NON AFRICAN AMERICAN: 69 mL/min (ref >=60)
GFR, Est African American: 80 mL/min (ref >=60)
GLUCOSE: 101 mg/dL — AB (ref 65–99)
Potassium: 4.2 mmol/L (ref 3.5–5.3)
SODIUM: 140 mmol/L (ref 135–146)

## 2016-09-05 LAB — HEPATIC FUNCTION PANEL
ALK PHOS: 73 U/L (ref 40–115)
ALT: 23 U/L (ref 9–46)
AST: 18 U/L (ref 10–35)
Albumin: 4.6 g/dL (ref 3.6–5.1)
BILIRUBIN DIRECT: 0.1 mg/dL (ref <=0.2)
BILIRUBIN INDIRECT: 0.5 mg/dL (ref 0.2–1.2)
TOTAL PROTEIN: 7.4 g/dL (ref 6.1–8.1)
Total Bilirubin: 0.6 mg/dL (ref 0.2–1.2)

## 2016-09-05 LAB — IRON AND TIBC
%SAT: 31 % (ref 15–60)
Iron: 90 ug/dL (ref 50–180)
TIBC: 294 ug/dL (ref 250–425)
UIBC: 204 ug/dL (ref 125–400)

## 2016-09-05 MED ORDER — PHENTERMINE HCL 37.5 MG PO TABS: ORAL_TABLET | ORAL | 3 refills | Status: DC

## 2016-09-05 NOTE — Progress Notes (Signed)
[place] [facility]   Unk Pinto, M.D.      Uvaldo Bristle. Silverio Lay, P.A.-C [facility]                [address] [place], [place] [address] Telephone [phone] Telefax [phone] Annual  Screening/Preventative Visit  & Comprehensive Evaluation & Examination     This very nice 51 y.o. MWM presents for a Screening/Preventative Visit & comprehensive evaluation and management of multiple medical co-morbidities.  Patient has been followed for HTN, Prediabetes, Hyperlipidemia and Vitamin D Deficiency. Patient relates he had a negative Colonoscopy in 2017 with recommendations for 10 yr f/u in 2027.      Today patient also relates tingling paresthesias to the Rt 4th & 5th fingers - ulnar distribution.     HTN predates since 1999. Patient's BP has been controlled at home.  Today's BP is borderline elevated at 134/90 and rechecked at 128/84.  Patient denies any cardiac symptoms as chest pain, palpitations, shortness of breath, dizziness or ankle swelling.     Patient's hyperlipidemia is controlled with diet and medications. Patient denies myalgias or other medication SE's. Last lipids were at goal: Lab Results  Component Value Date   CHOL 158 01/29/2016   HDL 42 01/29/2016   LDLCALC 99 01/29/2016   TRIG 85 01/29/2016   CHOLHDL 3.8 01/29/2016      Patient has Morbid Obesity (BMI 41+) and consequent PreDiabetes predating since 1999 with A1c 5.9%, and 6.4% in 2016 & then 6.6% in June 2017. Patient denies reactive hypoglycemic symptoms, visual blurring, diabetic polys or paresthesias. Last A1c was improved: Lab Results  Component Value Date   HGBA1C 5.8 (H) 01/29/2016       Finally, patient has history of Vitamin D Deficiency ("16" in 2008) and last vitamin D was still low: Lab Results  Component Value Date   VD25OH 46 10/19/2015   Current Outpatient Prescriptions on File Prior to Visit  Medication Sig  .  atorvastatin 20 MG tablet TAKE 1 TAB EVERY DAY  . citalopram 40 MG tablet TAKE 1 TAB EVERY DAY   . dicyclomine 20 MG tablet TAKE 1 TAB 3 TIMES A DAY AS NEEDED   . lisinopril  10 MG tablet TAKE 1 TAB DAILY.  . Multiple Vitamins-Minerals  Take 1 tablet  daily.  . ranitidine  300 MG tablet TAKE 1 TAB TWICE A DAY  . Vitamin D 50,000 units TAKE 1 CAP AS DIRECTED   Allergies  Allergen Reactions  . Bee Venom Anaphylaxis  . Chantix [Varenicline]    Past Medical History:  Diagnosis Date  . Anxiety   . Chest pain   . Elevated hemoglobin A1c   . Hyperlipidemia   . Hypertension   . Hypogonadism male   . Obese   . PND (post-nasal drip)   . Prediabetes   . SOB (shortness of breath) on exertion   . Vitamin D deficiency    Health Maintenance  Topic Date Due  . Hepatitis C Screening  1966-02-17  . OPHTHALMOLOGY EXAM  04/18/1974  . HIV Screening  04/19/1979  . HEMOGLOBIN A1C  07/28/2016  . FOOT EXAM  10/18/2016  . INFLUENZA VACCINE  12/04/2016  . COLONOSCOPY  10/14/2019  . TETANUS/TDAP  05/05/2022  . PNEUMOCOCCAL POLYSACCHARIDE VACCINE  Completed   Immunization History  Administered Date(s) Administered  . Influenza Split 02/01/2013, 03/11/2014  . PPD  Test 11/18/2013, 07/18/2014, 07/20/2015  . Pneumococcal Polysaccharide-23 11/05/2012  . Pneumococcal-Unspecified 05/06/1998  . Td 05/06/2001  . Tdap 05/05/2012   Past Surgical History:  Procedure Laterality Date  . LEFT HEART CATHETERIZATION WITH CORONARY ANGIOGRAM N/A 02/19/2013   Procedure: LEFT HEART CATHETERIZATION WITH CORONARY ANGIOGRAM;  Surgeon: Josue Hector, MD;  Location: [facility] CATH LAB;  Service: Cardiovascular;  Laterality: N/A;  . nasal ostea    . SQUAMOUS CELL CARCINOMA EXCISION  2004   Family History  Problem Relation Age of Onset  . Hypertension Mother   . COPD Mother   . Diabetes Sister   . Cancer Father     Lung cancer  . Colon cancer Neg Hx    Social History   Social History  . Marital status: Married     Spouse name: N/A  . Number of children: 2  . Years of education: N/A   Occupational History  . Psychologist, counselling at [employer]  . Smoking status: Former Smoker    Quit date: 05/01/2012  . Smokeless tobacco: Never Used  . Alcohol use 0.0 oz/week     Comment: very little  . Drug use: No  . Sexual activity: Not on file    ROS Constitutional: Denies fever, chills, weight loss/gain, headaches, insomnia,  night sweats or change in appetite. Does c/o fatigue. Eyes: Denies redness, blurred vision, diplopia, discharge, itchy or watery eyes.  ENT: Denies discharge, congestion, post nasal drip, epistaxis, sore throat, earache, hearing loss, dental pain, Tinnitus, Vertigo, Sinus pain or snoring.  Cardio: Denies chest pain, palpitations, irregular heartbeat, syncope, dyspnea, diaphoresis, orthopnea, PND, claudication or edema Respiratory: denies cough, dyspnea, DOE, pleurisy, hoarseness, laryngitis or wheezing.  Gastrointestinal: Denies dysphagia, heartburn, reflux, water brash, pain, cramps, nausea, vomiting, bloating, diarrhea, constipation, hematemesis, melena, hematochezia, jaundice or hemorrhoids Genitourinary: Denies dysuria, frequency, urgency, nocturia, hesitancy, discharge, hematuria or flank pain Musculoskeletal: Denies arthralgia, myalgia, stiffness, Jt. Swelling, pain, limp or strain/sprain. Denies Falls. Skin: Denies puritis, rash, hives, warts, acne, eczema or change in skin lesion Neuro: No weakness, tremor, incoordination, spasms or pain Psychiatric: Denies confusion, memory loss or sensory loss. Denies Depression. Endocrine: Denies change in weight, skin, hair change, nocturia, and paresthesia, diabetic polys, visual blurring or hyper / hypo glycemic episodes.  Heme/Lymph: No excessive bleeding, bruising or enlarged lymph nodes.  Physical Exam  BP 134/90 -> 128/84  P 76   T 97.3 F    R 16   Ht 5\' 10"     Wt 290 lb 9.6 oz    BMI 41.70    General Appearance: Over  nourished and well groomed and in no apparent distress.  Eyes: PERRLA, EOMs, conjunctiva no swelling or erythema, normal fundi and vessels. Sinuses: No frontal/maxillary tenderness ENT/Mouth: EACs patent / TMs  nl. Nares clear without erythema, swelling, mucoid exudates. Oral hygiene is good. No erythema, swelling, or exudate. Tongue normal, non-obstructing. Tonsils not swollen or erythematous. Hearing normal.  Neck: Supple, thyroid normal. No bruits, nodes or JVD. Respiratory: Respiratory effort normal.  BS equal and clear bilateral without rales, rhonci, wheezing or stridor. Cardio: Heart sounds are normal with regular rate and rhythm and no murmurs, rubs or gallops. Peripheral pulses are normal and equal bilaterally without edema. No aortic or femoral bruits. Chest: symmetric with normal excursions and percussion.  Abdomen: Soft, with Nl bowel sounds. Nontender, no guarding, rebound, hernias, masses, or organomegaly.  Lymphatics: Non tender without lymphadenopathy.  Genitourinary: No hernias.Testes nl. DRE -  prostate nl for age - smooth & firm w/o nodules. Musculoskeletal: Full ROM all peripheral extremities, joint stability, 5/5 strength, and normal gait. Skin: Warm and dry without rashes, lesions, cyanosis, clubbing or  ecchymosis.  Neuro: Cranial nerves intact, reflexes equal bilaterally. Normal muscle tone, no cerebellar symptoms. Sensation intact.  Pysch: Alert and oriented X 3 with normal affect, insight and judgment appropriate.   Assessment and Plan  1. Annual Preventative/Screening Exam   2. Essential hypertension  - EKG 12-Lead - [place], RETROPERITNL ABD,  LTD - Urinalysis, Routine w reflex microscopic - Microalbumin / creatinine urine ratio - CBC with Differential/Platelet - BASIC METABOLIC PANEL WITH GFR - Magnesium - TSH  3. Hyperlipidemia, mixed  - EKG 12-Lead - [place], RETROPERITNL ABD,  LTD - Hepatic function panel - Lipid panel -  TSH  4. T2_NIDDM  - EKG 12-Lead - [place], RETROPERITNL ABD,  LTD - HM DIABETES FOOT EXAM - LOW EXTREMITY NEUR EXAM DOCUM - Hemoglobin A1c - Insulin, random  5. Vitamin D deficiency  - VITAMIN D 25 Hydroxy  6. Morbid obesity (41.9)   7. Gastroesophageal reflux disease   8. Screening for rectal cancer  - POC Hemoccult Bld/St  9. Prostate cancer screening  - PSA  10. Screening examination for pulmonary tuberculosis  - PPD  11. Screening for ischemic heart disease  - EKG 12-Lead  12. Screening for AAA (aortic abdominal aneurysm)  - [place], RETROPERITNL ABD,  LTD  13. Fatigue, unspecified type  - Vitamin B12 - Iron and TIBC - Testosterone - TSH  14. Medication management  - Urinalysis, Routine w reflex microscopic - Microalbumin / creatinine urine ratio - CBC with Differential/Platelet - BASIC METABOLIC PANEL WITH GFR - Hepatic function panel - Magnesium - Lipid panel - TSH - Hemoglobin A1c - Insulin, random - VITAMIN D 25 Hydroxy   15. Ulnar neuropathy at elbow of right upper extremity  - Ambulatory referral to Orthopedic Surgery     Long discussion [id number] of long term consequences of his severe obesity and importance of dietary compliance and weight loss. Patient is amenable to a trial and given Rx Phentermine 37.7 mg #30 x 3 rf.           Patient was counseled in prudent diet, weight control to achieve/maintain BMI less than 25, BP monitoring, regular exercise and medications as discussed.  Discussed med effects and SE's. Routine screening labs and tests as requested with regular follow-up as recommended. Over 40 minutes of exam, counseling, chart review and high complex critical decision making was performed

## 2016-09-05 NOTE — Patient Instructions (Signed)

## 2016-09-06 LAB — URINALYSIS, ROUTINE W REFLEX MICROSCOPIC
Bilirubin Urine: NEGATIVE
HGB URINE DIPSTICK: NEGATIVE
Ketones, ur: NEGATIVE
Leukocytes, UA: NEGATIVE
NITRITE: NEGATIVE
PH: 6 (ref 5.0–8.0)
PROTEIN: NEGATIVE
Specific Gravity, Urine: 1.026 (ref 1.001–1.035)

## 2016-09-06 LAB — INSULIN, RANDOM: Insulin: 88.8 u[IU]/mL — ABNORMAL HIGH (ref 2.0–19.6)

## 2016-09-06 LAB — VITAMIN B12: Vitamin B-12: 475 pg/mL (ref 200–1100)

## 2016-09-06 LAB — PSA: PSA: 0.2 ng/mL (ref <=4.0)

## 2016-09-06 LAB — MAGNESIUM: Magnesium: 2.1 mg/dL (ref 1.5–2.5)

## 2016-09-06 LAB — TSH: TSH: 1.76 m[IU]/L (ref 0.40–4.50)

## 2016-09-06 LAB — MICROALBUMIN / CREATININE URINE RATIO
CREATININE, URINE: 244 mg/dL (ref 20–370)
MICROALB UR: 0.5 mg/dL
Microalb Creat Ratio: 2 mcg/mg creat (ref <30)

## 2016-09-06 LAB — HEMOGLOBIN A1C
HEMOGLOBIN A1C: 6.3 % — AB (ref <5.7)
Mean Plasma Glucose: 134 mg/dL

## 2016-09-06 LAB — VITAMIN D 25 HYDROXY (VIT D DEFICIENCY, FRACTURES): VIT D 25 HYDROXY: 60 ng/mL (ref 30–100)

## 2016-09-06 LAB — TESTOSTERONE: TESTOSTERONE: 163 ng/dL — AB (ref 250–827)

## 2016-09-14 ENCOUNTER — Other Ambulatory Visit: Payer: Self-pay | Admitting: Internal Medicine

## 2016-09-30 ENCOUNTER — Other Ambulatory Visit: Payer: Self-pay | Admitting: Internal Medicine

## 2016-12-29 NOTE — Progress Notes (Signed)
Assessment and Plan:  Hypertension monitor blood pressure at home. Continue DASH diet.  Reminder to go to the ER if any CP, SOB, nausea, dizziness, severe HA, changes vision/speech, left arm numbness and tingling and jaw pain.  Cholesterol -Continue diet and exercise. Check cholesterol.    Diabetes without complications -Continue diet and exercise. Check A1C   Vitamin D Def - check level and continue medications.   Obesity with co morbidities - long discussion about weight loss, diet, and exercise  Plantar Faciitis-  Conservative treatment, night time orthotics, arch support, RICE, NSAID, stretches given If not better will do injection of dexamethasone  Continue diet and meds as discussed. Further disposition pending results of labs. Discussed med's effects and SE's.    Over 30 minutes of exam, counseling, chart review, and critical decision making was performed   HPI 51 y.o. male  presents for 3 month follow up on hypertension, cholesterol, diabetes and vitamin D deficiency.   His blood pressure has not been controlled at home, today his BP is BP: 132/80. Had normal stress test and echo 2014. No CV symptoms. He is having right heel pain intermittent x 2 months, better with walking, worse with resting and then getting up. No redness, swelling. Same boots, tennis shoes. Has been walking more at work.   He does workout. He denies chest pain, shortness of breath, dizziness.  He is on cholesterol medication and denies myalgias. His cholesterol is at goal. The cholesterol was:   Lab Results  Component Value Date   CHOL 168 09/05/2016   HDL 38 (L) 09/05/2016   LDLCALC 100 (H) 09/05/2016   TRIG 152 (H) 09/05/2016   CHOLHDL 4.4 09/05/2016    He has been working on diet and exercise for diabetes without complications, he is not on bASA, he is on ACE/ARB, and denies  paresthesia of the feet, polydipsia, polyuria and visual disturbances. Last A1C was:  Lab Results  Component Value Date    HGBA1C 6.3 (H) 09/05/2016    Patient is on Vitamin D supplement. Lab Results  Component Value Date   VD25OH 60 09/05/2016   BMI is Body mass index is 38.88 kg/m., he is working on diet and exercise. Wt Readings from Last 3 Encounters:  12/30/16 271 lb (122.9 kg)  09/05/16 290 lb 9.6 oz (131.8 kg)  05/07/16 282 lb (127.9 kg)     Current Medications:  Current Outpatient Prescriptions on File Prior to Visit  Medication Sig Dispense Refill  . atorvastatin (LIPITOR) 20 MG tablet TAKE 1 TABLET BY MOUTH EVERY DAY 90 tablet 1  . citalopram (CELEXA) 40 MG tablet TAKE 1 TABLET BY MOUTH EVERY DAY FOR NERVES/STRESS 90 tablet 0  . dicyclomine (BENTYL) 20 MG tablet TAKE 1 TABLET BY MOUTH 3 TIMES A DAY AS NEEDED FOR CRAMPING, BLOATING, OR NAUSEA 90 tablet 0  . lisinopril (PRINIVIL,ZESTRIL) 10 MG tablet TAKE 1 TABLET (10 MG TOTAL) BY MOUTH DAILY. 90 tablet 0  . Multiple Vitamins-Minerals (MULTIVITAMIN WITH MINERALS) tablet Take 1 tablet by mouth daily.    . phentermine (ADIPEX-P) 37.5 MG tablet Take 1/2 to 1 tablet every morning for dieting & weightloss 30 tablet 3  . ranitidine (ZANTAC) 300 MG tablet TAKE 1 TABLET TWICE A DAY 180 tablet 1  . Vitamin D, Ergocalciferol, (DRISDOL) 50000 units CAPS capsule TAKE 1 CAPSULE BY MOUTH ONCE DAILY OR AS DIRECTED 90 capsule 1   No current facility-administered medications on file prior to visit.    Medical History:  Past  Medical History:  Diagnosis Date  . Anxiety   . Chest pain   . Elevated hemoglobin A1c   . Hyperlipidemia   . Hypertension   . Hypogonadism male   . Obese   . PND (post-nasal drip)   . Prediabetes   . SOB (shortness of breath) on exertion   . Vitamin D deficiency    Allergies:  Allergies  Allergen Reactions  . Bee Venom Anaphylaxis  . Chantix [Varenicline]      Review of Systems:  Review of Systems  Constitutional: Negative for chills, diaphoresis, fever, malaise/fatigue and weight loss.  HENT: Negative for congestion,  ear discharge, ear pain, hearing loss, nosebleeds, sore throat and tinnitus.   Eyes: Negative.  Negative for blurred vision.  Respiratory: Negative.  Negative for shortness of breath and stridor.   Cardiovascular: Negative.  Negative for chest pain.  Gastrointestinal: Negative.   Genitourinary: Negative.   Musculoskeletal: Negative.   Skin: Negative.   Neurological: Negative.  Negative for dizziness, weakness and headaches.  Psychiatric/Behavioral: Negative.  Negative for depression.    Family history- Review and unchanged Social history- Review and unchanged Physical Exam: BP 132/80   Pulse 97   Temp 98.1 F (36.7 C)   Resp 16   Ht 5\' 10"  (1.778 m)   Wt 271 lb (122.9 kg)   SpO2 97%   BMI 38.88 kg/m  Wt Readings from Last 3 Encounters:  12/30/16 271 lb (122.9 kg)  09/05/16 290 lb 9.6 oz (131.8 kg)  05/07/16 282 lb (127.9 kg)   General Appearance: Well nourished, in no apparent distress. Eyes: PERRLA, EOMs, conjunctiva no swelling or erythema Sinuses: + Frontal/maxillary tenderness ENT/Mouth: Ext aud canals clear, left TM without erythema, bulging. Right TM with dullness, bulging and erythema, no mastoid tenderness. No erythema, swelling, or exudate on post pharynx.  Tonsils not swollen or erythematous. Hearing normal.  Neck: Supple, thyroid normal.  Respiratory: Respiratory effort normal, BS equal bilaterally without rales, rhonchi, wheezing or stridor.  Cardio: RRR with no MRGs. Brisk peripheral pulses without edema.  Abdomen: Soft, + BS.  Non tender, no guarding, rebound, hernias, masses. Lymphatics: Non tender without lymphadenopathy.  Musculoskeletal: Full ROM, 5/5 strength, Normal gait Skin: Warm, dry without rashes, lesions, ecchymosis.  Neuro: Cranial nerves intact. No cerebellar symptoms.  Psych: Awake and oriented X 3, normal affect, Insight and Judgment appropriate.    Vicie Mutters, PA-C 3:53 PM [facility]

## 2016-12-30 ENCOUNTER — Ambulatory Visit (INDEPENDENT_AMBULATORY_CARE_PROVIDER_SITE_OTHER): Payer: 59 | Admitting: Physician Assistant

## 2016-12-30 ENCOUNTER — Encounter: Payer: Self-pay | Admitting: Physician Assistant

## 2016-12-30 VITALS — BP 132/80 | HR 97 | Temp 98.1°F | Resp 16 | Ht 70.0 in | Wt 271.0 lb

## 2016-12-30 DIAGNOSIS — E782 Mixed hyperlipidemia: Secondary | ICD-10-CM | POA: Diagnosis not present

## 2016-12-30 DIAGNOSIS — E291 Testicular hypofunction: Secondary | ICD-10-CM

## 2016-12-30 DIAGNOSIS — Z79899 Other long term (current) drug therapy: Secondary | ICD-10-CM | POA: Diagnosis not present

## 2016-12-30 DIAGNOSIS — I10 Essential (primary) hypertension: Secondary | ICD-10-CM

## 2016-12-30 DIAGNOSIS — E119 Type 2 diabetes mellitus without complications: Secondary | ICD-10-CM

## 2016-12-30 NOTE — Patient Instructions (Signed)
Vionic shoes  Drink 80-100 oz a day of water, measure it out Eat 3 meals a day, have to do breakfast, eat protein- hard boiled eggs, protein bar like nature [place] protein bar, greek yogurt like oikos triple zero, chobani 100, or light n fit greek  We want weight loss that will last so you should lose 1-2 pounds a week.  THAT IS IT! Please pick THREE things a month to change. Once it is a habit check off the item. Then pick another three items off the list to become habits.  If you are already doing a habit on the list GREAT!  Cross that item off! o Don't drink your calories. Ie, alcohol, soda, fruit juice, and sweet tea.  o Drink more water. Drink a glass when you feel hungry or before each meal.  o Eat breakfast - Complex carb and protein (likeDannon light and fit yogurt, oatmeal, fruit, eggs, [place] bacon). o Measure your cereal.  Eat no more than one cup a day. (ie [place]) o Eat an apple a day. o Add a vegetable a day. o Try a new vegetable a month. o Use Pam! Stop using oil or butter to cook. o Don't finish your plate or use smaller plates. o Share your dessert. o Eat sugar free Jello for dessert or frozen grapes. o Don't eat 2-3 hours before bed. o Switch to whole wheat bread, pasta, and brown rice. o Make healthier choices when you eat out. No fries! o Pick baked chicken, NOT fried. o Don't forget to SLOW DOWN when you eat. It is not going anywhere.  o Take the stairs. o Park far away in the parking lot o [employer] (or weights) for 10 minutes while watching TV. o Walk at work for 10 minutes during break. o Walk outside 1 time a week with your friend, kids, dog, or significant other. o Start a walking group at [place] the mall as much as you can tolerate.  o Keep a food diary. o Weigh yourself daily. o Walk for 15 minutes 3 days per week. o Cook at home more often and eat out less.  If life happens and you go back to old habits, it is okay.  Just start over. You can  do it!   If you experience chest pain, get short of breath, or tired during the exercise, please stop immediately and inform your doctor.     Simple math prevails.    1st - exercise does not produce significant weight loss - at best one converts fat into muscle , "bulks up", loses inches, but usually stays "weight neutral"     2nd - think of your body weightas a check book: If you eat more calories than you burn up - you save money or gain weight .... Or if you spend more money than you put in the check book, ie burn up more calories than you eat, then you lose weight     3rd - if you walk or run 1 mile, you burn up 100 calories - you have to burn up 3,500 calories to lose 1 pound, ie you have to walk/run 35 miles to lose 1 measly pound. So if you want to lose 10 #, then you have to walk/run 350 miles, so.... clearly exercise is not the solution.     4. So if you consume 1,500 calories, then you have to burn up the equivalent of 15 miles to stay weight neutral - It  also stands to reason that if you consume 1,500 cal/day and don't lose weight, then you must be burning up about 1,500 cals/day to stay weight neutral.     5. If you really want to lose weight, you must cut your calorie intake 300 calories /day and at that rate you should lose about 1 # every 3 days.   6. Please purchase Dr Fara Olden Fuhrman's book(s) "The End of Dieting" & "Eat to Live" . It has some great concepts and recipes.      Plantar Fasciitis Plantar fasciitis is a painful foot condition that affects the heel. It occurs when the band of tissue that connects the toes to the heel bone (plantar fascia) becomes irritated. This can happen after exercising too much or doing other repetitive activities (overuse injury). The pain from plantar fasciitis can range from mild irritation to severe pain that makes it difficult for you to walk or move. The pain is usually worse in the morning or after you have been sitting or lying down for a  while. What are the causes? This condition may be caused by:  Standing for long periods of time.  Wearing shoes that do not fit.  Doing high-impact activities, including running, aerobics, and ballet.  Being overweight.  Having an abnormal way of walking (gait).  Having tight calf muscles.  Having high arches in your feet.  Starting a new athletic activity.  What are the signs or symptoms? The main symptom of this condition is heel pain. Other symptoms include:  Pain that gets worse after activity or exercise.  Pain that is worse in the morning or after resting.  Pain that goes away after you walk for a few minutes.  How is this diagnosed? This condition may be diagnosed based on your signs and symptoms. Your health care provider will also do a physical exam to check for:  A tender area on the bottom of your foot.  A high arch in your foot.  Pain when you move your foot.  Difficulty moving your foot.  You may also need to have imaging studies to confirm the diagnosis. These can include:  X-rays.  Ultrasound.  MRI.  How is this treated? Treatment for plantar fasciitis depends on the severity of the condition. Your treatment may include:  Rest, ice, and over-the-counter pain medicines to manage your pain.  Exercises to stretch your calves and your plantar fascia.  A splint that holds your foot in a stretched, upward position while you sleep (night splint).  Physical therapy to relieve symptoms and prevent problems in the future.  Cortisone injections to relieve severe pain.  Extracorporeal shock wave therapy (ESWT) to stimulate damaged plantar fascia with electrical impulses. It is often used as a last resort before surgery.  Surgery, if other treatments have not worked after 12 months.  Follow these instructions at home:  Take medicines only as directed by your health care provider.  Avoid activities that cause pain.  Roll the bottom of your foot  over a bag of ice or a bottle of cold water. Do this for 20 minutes, 3-4 times a day.  Perform simple stretches as directed by your health care provider.  Try wearing athletic shoes with air-sole or gel-sole cushions or soft shoe inserts.  Wear a night splint while sleeping, if directed by your health care provider.  Keep all follow-up appointments with your health care provider. How is this prevented?  Do not perform exercises or activities that cause heel  pain.  Consider finding low-impact activities if you continue to have problems.  Lose weight if you need to. The best way to prevent plantar fasciitis is to avoid the activities that aggravate your plantar fascia. Contact a health care provider if:  Your symptoms do not go away after treatment with home care measures.  Your pain gets worse.  Your pain affects your ability to move or do your daily activities. This information is not intended to replace advice given to you by your health care provider. Make sure you discuss any questions you have with your health care provider. Document Released: 01/15/2001 Document Revised: 09/25/2015 Document Reviewed: 03/02/2014 Elsevier Interactive Patient Education  [employer].

## 2017-01-02 LAB — HEPATIC FUNCTION PANEL
AG Ratio: 2 (calc) (ref 1.0–2.5)
ALBUMIN MSPROF: 4.7 g/dL (ref 3.6–5.1)
ALT: 22 U/L (ref 9–46)
AST: 15 U/L (ref 10–35)
Alkaline phosphatase (APISO): 78 U/L (ref 40–115)
BILIRUBIN DIRECT: 0.2 mg/dL (ref 0.0–0.2)
Globulin: 2.3 g/dL (calc) (ref 1.9–3.7)
Indirect Bilirubin: 0.5 mg/dL (calc) (ref 0.2–1.2)
TOTAL PROTEIN: 7 g/dL (ref 6.1–8.1)
Total Bilirubin: 0.7 mg/dL (ref 0.2–1.2)

## 2017-01-02 LAB — BASIC METABOLIC PANEL WITH GFR
BUN: 22 mg/dL (ref 7–25)
CALCIUM: 9.2 mg/dL (ref 8.6–10.3)
CHLORIDE: 102 mmol/L (ref 98–110)
CO2: 27 mmol/L (ref 20–32)
Creat: 0.93 mg/dL (ref 0.70–1.33)
GFR, Est African American: 109 mL/min/{1.73_m2} (ref >=60)
GFR, Est Non African American: 94 mL/min/{1.73_m2} (ref >=60)
GLUCOSE: 85 mg/dL (ref 65–99)
POTASSIUM: 3.9 mmol/L (ref 3.5–5.3)
Sodium: 139 mmol/L (ref 135–146)

## 2017-01-02 LAB — CBC WITH DIFFERENTIAL/PLATELET
BASOS ABS: 96 {cells}/uL (ref 0–200)
Basophils Relative: 1 %
EOS ABS: 269 {cells}/uL (ref 15–500)
EOS PCT: 2.8 %
HCT: 41.8 % (ref 38.5–50.0)
Hemoglobin: 14.2 g/dL (ref 13.2–17.1)
Lymphs Abs: 2285 cells/uL (ref 850–3900)
MCH: 30.6 pg (ref 27.0–33.0)
MCHC: 34 g/dL (ref 32.0–36.0)
MCV: 90.1 fL (ref 80.0–100.0)
MONOS PCT: 8.9 %
MPV: 12 fL (ref 7.5–12.5)
Neutro Abs: 6096 cells/uL (ref 1500–7800)
Neutrophils Relative %: 63.5 %
PLATELETS: 233 10*3/uL (ref 140–400)
RBC: 4.64 10*6/uL (ref 4.20–5.80)
RDW: 11.9 % (ref 11.0–15.0)
TOTAL LYMPHOCYTE: 23.8 %
WBC mixed population: 854 cells/uL (ref 200–950)
WBC: 9.6 10*3/uL (ref 3.8–10.8)

## 2017-01-02 LAB — HEMOGLOBIN A1C
HEMOGLOBIN A1C: 5.8 %{Hb} — AB (ref <5.7)
Mean Plasma Glucose: 120 (calc)
eAG (mmol/L): 6.6 (calc)

## 2017-01-02 LAB — LIPID PANEL
Cholesterol: 159 mg/dL (ref <200)
HDL: 37 mg/dL — AB (ref >40)
LDL Cholesterol (Calc): 101 mg/dL (calc) — ABNORMAL HIGH
Non-HDL Cholesterol (Calc): 122 mg/dL (calc) (ref <130)
TRIGLYCERIDES: 114 mg/dL (ref <150)
Total CHOL/HDL Ratio: 4.3 (calc) (ref <5.0)

## 2017-01-02 LAB — MAGNESIUM: MAGNESIUM: 1.9 mg/dL (ref 1.5–2.5)

## 2017-01-02 LAB — TSH: TSH: 1.8 m[IU]/L (ref 0.40–4.50)

## 2017-01-24 ENCOUNTER — Other Ambulatory Visit: Payer: Self-pay | Admitting: Internal Medicine

## 2017-02-13 ENCOUNTER — Other Ambulatory Visit: Payer: Self-pay | Admitting: Internal Medicine

## 2017-02-13 DIAGNOSIS — E66813 Obesity, class 3: Secondary | ICD-10-CM

## 2017-02-13 DIAGNOSIS — K589 Irritable bowel syndrome without diarrhea: Secondary | ICD-10-CM

## 2017-02-13 DIAGNOSIS — Z6841 Body Mass Index (BMI) 40.0 and over, adult: Secondary | ICD-10-CM

## 2017-02-13 NOTE — Telephone Encounter (Signed)
Phentermine has been called into pharmacy on Oct 11th 2018 by DD

## 2017-02-15 ENCOUNTER — Other Ambulatory Visit: Payer: Self-pay | Admitting: Physician Assistant

## 2017-02-24 ENCOUNTER — Encounter: Payer: Self-pay | Admitting: Physician Assistant

## 2017-02-28 ENCOUNTER — Ambulatory Visit (INDEPENDENT_AMBULATORY_CARE_PROVIDER_SITE_OTHER): Payer: 59 | Admitting: *Deleted

## 2017-02-28 DIAGNOSIS — Z23 Encounter for immunization: Secondary | ICD-10-CM | POA: Diagnosis not present

## 2017-03-08 ENCOUNTER — Other Ambulatory Visit: Payer: Self-pay | Admitting: Internal Medicine

## 2017-04-02 ENCOUNTER — Ambulatory Visit: Payer: Self-pay | Admitting: Internal Medicine

## 2017-04-25 ENCOUNTER — Other Ambulatory Visit: Payer: Self-pay | Admitting: Physician Assistant

## 2017-04-25 DIAGNOSIS — Z6841 Body Mass Index (BMI) 40.0 and over, adult: Principal | ICD-10-CM

## 2017-05-01 ENCOUNTER — Ambulatory Visit: Payer: Self-pay | Admitting: Internal Medicine

## 2017-05-12 ENCOUNTER — Other Ambulatory Visit: Payer: Self-pay | Admitting: Physician Assistant

## 2017-05-12 DIAGNOSIS — K589 Irritable bowel syndrome without diarrhea: Secondary | ICD-10-CM

## 2017-06-05 ENCOUNTER — Ambulatory Visit: Payer: 59 | Admitting: Internal Medicine

## 2017-06-05 VITALS — BP 108/78 | HR 72 | Temp 97.7°F | Resp 16 | Ht 70.0 in | Wt 258.8 lb

## 2017-06-05 DIAGNOSIS — I10 Essential (primary) hypertension: Secondary | ICD-10-CM | POA: Diagnosis not present

## 2017-06-05 DIAGNOSIS — Z79899 Other long term (current) drug therapy: Secondary | ICD-10-CM

## 2017-06-05 DIAGNOSIS — E559 Vitamin D deficiency, unspecified: Secondary | ICD-10-CM | POA: Diagnosis not present

## 2017-06-05 DIAGNOSIS — E119 Type 2 diabetes mellitus without complications: Secondary | ICD-10-CM | POA: Diagnosis not present

## 2017-06-05 DIAGNOSIS — K219 Gastro-esophageal reflux disease without esophagitis: Secondary | ICD-10-CM | POA: Diagnosis not present

## 2017-06-05 DIAGNOSIS — E782 Mixed hyperlipidemia: Secondary | ICD-10-CM

## 2017-06-05 NOTE — Patient Instructions (Signed)

## 2017-06-05 NOTE — Progress Notes (Signed)
This very nice 52 y.o. MWM presents for 6 month follow up with Hypertension, Hyperlipidemia, Pre-Diabetes and Vitamin D Deficiency.      Patient is treated for HTN (1999) & BP has been controlled at home. Today's BP is at goal - 108/78. Patient has had no complaints of any cardiac type chest pain, palpitations, dyspnea / orthopnea / PND, dizziness, claudication, or dependent edema.     Hyperlipidemia is controlled with diet & meds. Patient denies myalgias or other med SE's. Last Lipids were at goal: Lab Results  Component Value Date   CHOL 145 06/05/2017   HDL 37 (L) 06/05/2017   LDLCALC 100 (H) 09/05/2016   TRIG 171 (H) 06/05/2017   CHOLHDL 3.9 06/05/2017      Also, the patient has history of Morbid Obesity (BMI 37+)   And his weight is down 31# in the last 6 months since May 2018 which he attributes to better dietary choices and assisted w/Phentermine. He does have hx/o PreDiabetes (A1c 5.9% /1999, 6.4%/2016 and 6.6%/2017) and has had no symptoms of reactive hypoglycemia, diabetic polys, paresthesias or visual blurring.  Last A1c was near goal: Lab Results  Component Value Date   HGBA1C 5.8 (H) 06/05/2017      Further, the patient also has history of Vitamin D Deficiency ("16"/2008) and supplements vitamin D without any suspected side-effects. Last vitamin D was at goal:  Lab Results  Component Value Date   VD25OH 85 06/05/2017   Current Outpatient Medications on File Prior to Visit  Medication Sig  . atorvastatin (LIPITOR) 20 MG tablet TAKE 1 TABLET BY MOUTH EVERY DAY  . citalopram (CELEXA) 40 MG tablet TAKE 1 TABLET BY MOUTH EVERY DAY FOR NERVES/STRESS  . dicyclomine (BENTYL) 20 MG tablet TAKE 1 TABLET BY MOUTH 3 TIMES A DAY AS NEEDED FOR CRAMPING, BLOATING, OR NAUSEA  . lisinopril (PRINIVIL,ZESTRIL) 10 MG tablet TAKE 1 TABLET (10 MG TOTAL) BY MOUTH DAILY.  . Multiple Vitamins-Minerals (MULTIVITAMIN WITH MINERALS) tablet Take 1 tablet by mouth daily.  . phentermine (ADIPEX-P)  37.5 MG tablet TAKE 1/2 TO 1 TABLET BY MOUTH DAILY FOR DIET AND WEIGHT LOSS  . ranitidine (ZANTAC) 300 MG tablet TAKE 1 TABLET TWICE A DAY  . Vitamin D, Ergocalciferol, (DRISDOL) 50000 units CAPS capsule TAKE 1 CAPSULE BY MOUTH ONCE DAILY OR AS DIRECTED   No current facility-administered medications on file prior to visit.    Allergies  Allergen Reactions  . Bee Venom Anaphylaxis  . Chantix [Varenicline]    PMHx:   Past Medical History:  Diagnosis Date  . Anxiety   . Chest pain   . Elevated hemoglobin A1c   . Hyperlipidemia   . Hypertension   . Hypogonadism male   . Obese   . PND (post-nasal drip)   . Prediabetes   . SOB (shortness of breath) on exertion   . Vitamin D deficiency    Immunization History  Administered Date(s) Administered  . Influenza Inj Mdck Quad With Preservative 02/28/2017  . Influenza Split 02/01/2013, 03/11/2014  . PPD Test 11/18/2013, 07/18/2014, 07/20/2015, 09/05/2016  . Pneumococcal Polysaccharide-23 11/05/2012  . Pneumococcal-Unspecified 05/06/1998  . Td 05/06/2001  . Tdap 05/05/2012   Past Surgical History:  Procedure Laterality Date  . LEFT HEART CATHETERIZATION WITH CORONARY ANGIOGRAM N/A 02/19/2013   Procedure: LEFT HEART CATHETERIZATION WITH CORONARY ANGIOGRAM;  Surgeon: Josue Hector, MD;  Location: [facility] CATH LAB;  Service: Cardiovascular;  Laterality: N/A;  . nasal ostea    .  SQUAMOUS CELL CARCINOMA EXCISION  2004   FHx:    Reviewed / unchanged  SHx:    Reviewed / unchanged  Systems Review:  Constitutional: Denies fever, chills, wt changes, headaches, insomnia, fatigue, night sweats, change in appetite. Eyes: Denies redness, blurred vision, diplopia, discharge, itchy, watery eyes.  ENT: Denies discharge, congestion, post nasal drip, epistaxis, sore throat, earache, hearing loss, dental pain, tinnitus, vertigo, sinus pain, snoring.  CV: Denies chest pain, palpitations, irregular heartbeat, syncope, dyspnea, diaphoresis, orthopnea, PND,  claudication or edema. Respiratory: denies cough, dyspnea, DOE, pleurisy, hoarseness, laryngitis, wheezing.  Gastrointestinal: Denies dysphagia, odynophagia, heartburn, reflux, water brash, abdominal pain or cramps, nausea, vomiting, bloating, diarrhea, constipation, hematemesis, melena, hematochezia  or hemorrhoids. Genitourinary: Denies dysuria, frequency, urgency, nocturia, hesitancy, discharge, hematuria or flank pain. Musculoskeletal: Denies arthralgias, myalgias, stiffness, jt. swelling, pain, limping or strain/sprain.  Skin: Denies pruritus, rash, hives, warts, acne, eczema or change in skin lesion(s). Neuro: No weakness, tremor, incoordination, spasms, paresthesia or pain. Psychiatric: Denies confusion, memory loss or sensory loss. Endo: Denies change in weight, skin or hair change.  Heme/Lymph: No excessive bleeding, bruising or enlarged lymph nodes.  Physical Exam  BP 108/78   Pulse 72   Temp 97.7 F (36.5 C)   Resp 16   Ht 5\' 10"  (1.778 m)   Wt 258 lb 12.8 oz (117.4 kg)   BMI 37.13 kg/m   Appears well nourished, well groomed  and in no distress.  Eyes: PERRLA, EOMs, conjunctiva no swelling or erythema. Sinuses: No frontal/maxillary tenderness ENT/Mouth: EAC's clear, TM's nl w/o erythema, bulging. Nares clear w/o erythema, swelling, exudates. Oropharynx clear without erythema or exudates. Oral hygiene is good. Tongue normal, non obstructing. Hearing intact.  Neck: Supple. Thyroid nl. Car 2+/2+ without bruits, nodes or JVD. Chest: Respirations nl with BS clear & equal w/o rales, rhonchi, wheezing or stridor.  Cor: Heart sounds normal w/ regular rate and rhythm without sig. murmurs, gallops, clicks or rubs. Peripheral pulses normal and equal  without edema.  Abdomen: Soft & bowel sounds normal. Non-tender w/o guarding, rebound, hernias, masses or organomegaly.  Lymphatics: Unremarkable.  Musculoskeletal: Full ROM all peripheral extremities, joint stability, 5/5 strength and  normal gait.  Skin: Warm, dry without exposed rashes, lesions or ecchymosis apparent.  Neuro: Cranial nerves intact, reflexes equal bilaterally. Sensory-motor testing grossly intact. Tendon reflexes grossly intact.  Pysch: Alert & oriented x 3.  Insight and judgement nl & appropriate. No ideations.  Assessment and Plan:  1. Essential hypertension  - Continue medication, monitor blood pressure at home.  - Continue DASH diet. Reminder to go to the ER if any CP,  SOB, nausea, dizziness, severe HA, changes vision/speech.  - CBC with Differential/Platelet - BASIC METABOLIC PANEL WITH GFR - Magnesium - TSH  2. Hyperlipidemia, mixed  - Continue diet/meds, exercise,& lifestyle modifications.  - Continue monitor periodic cholesterol/liver & renal functions   - Hepatic function panel - Lipid panel - TSH  3. T2_NIDDM  - Continue diet, exercise, lifestyle modifications.  - Monitor appropriate labs.  - Hemoglobin A1c - Insulin, random  4. Vitamin D deficiency  - Continue supplementation.  - VITAMIN D 25 Hydroxy  5. Gastroesophageal reflux disease  - CBC with Differential/Platelet  6. Medication management  - CBC with Differential/Platelet - BASIC METABOLIC PANEL WITH GFR - Hepatic function panel - Magnesium - Lipid panel - TSH - Hemoglobin A1c - Insulin, random - VITAMIN D 25 Hydroxy        Discussed  regular exercise, BP  monitoring, weight control to achieve/maintain BMI less than 25 and discussed med and SE's. Recommended labs to assess and monitor clinical status with further disposition pending results of labs. Over 30 minutes of exam, counseling, chart review was performed.

## 2017-06-06 LAB — CBC WITH DIFFERENTIAL/PLATELET
BASOS ABS: 112 {cells}/uL (ref 0–200)
BASOS PCT: 1.2 %
EOS ABS: 158 {cells}/uL (ref 15–500)
Eosinophils Relative: 1.7 %
HEMATOCRIT: 42.8 % (ref 38.5–50.0)
Hemoglobin: 14.9 g/dL (ref 13.2–17.1)
LYMPHS ABS: 2325 {cells}/uL (ref 850–3900)
MCH: 30.5 pg (ref 27.0–33.0)
MCHC: 34.8 g/dL (ref 32.0–36.0)
MCV: 87.7 fL (ref 80.0–100.0)
MPV: 11.9 fL (ref 7.5–12.5)
Monocytes Relative: 8.3 %
Neutro Abs: 5933 cells/uL (ref 1500–7800)
Neutrophils Relative %: 63.8 %
Platelets: 258 10*3/uL (ref 140–400)
RBC: 4.88 10*6/uL (ref 4.20–5.80)
RDW: 11.7 % (ref 11.0–15.0)
Total Lymphocyte: 25 %
WBC: 9.3 10*3/uL (ref 3.8–10.8)
WBCMIX: 772 {cells}/uL (ref 200–950)

## 2017-06-06 LAB — BASIC METABOLIC PANEL WITH GFR
BUN: 18 mg/dL (ref 7–25)
CALCIUM: 10.1 mg/dL (ref 8.6–10.3)
CHLORIDE: 101 mmol/L (ref 98–110)
CO2: 30 mmol/L (ref 20–32)
Creat: 1.17 mg/dL (ref 0.70–1.33)
GFR, EST NON AFRICAN AMERICAN: 71 mL/min/{1.73_m2} (ref >=60)
GFR, Est African American: 82 mL/min/{1.73_m2} (ref >=60)
Glucose, Bld: 91 mg/dL (ref 65–99)
Potassium: 4.5 mmol/L (ref 3.5–5.3)
Sodium: 139 mmol/L (ref 135–146)

## 2017-06-06 LAB — INSULIN, RANDOM: INSULIN: 51.3 u[IU]/mL — AB (ref 2.0–19.6)

## 2017-06-06 LAB — LIPID PANEL
Cholesterol: 145 mg/dL (ref <200)
HDL: 37 mg/dL — AB (ref >40)
LDL CHOLESTEROL (CALC): 81 mg/dL
Non-HDL Cholesterol (Calc): 108 mg/dL (calc) (ref <130)
TRIGLYCERIDES: 171 mg/dL — AB (ref <150)
Total CHOL/HDL Ratio: 3.9 (calc) (ref <5.0)

## 2017-06-06 LAB — HEPATIC FUNCTION PANEL
AG Ratio: 1.9 (calc) (ref 1.0–2.5)
ALT: 19 U/L (ref 9–46)
AST: 15 U/L (ref 10–35)
Albumin: 4.7 g/dL (ref 3.6–5.1)
Alkaline phosphatase (APISO): 87 U/L (ref 40–115)
BILIRUBIN TOTAL: 0.8 mg/dL (ref 0.2–1.2)
Bilirubin, Direct: 0.2 mg/dL (ref 0.0–0.2)
GLOBULIN: 2.5 g/dL (ref 1.9–3.7)
Indirect Bilirubin: 0.6 mg/dL (calc) (ref 0.2–1.2)
Total Protein: 7.2 g/dL (ref 6.1–8.1)

## 2017-06-06 LAB — VITAMIN D 25 HYDROXY (VIT D DEFICIENCY, FRACTURES): VIT D 25 HYDROXY: 85 ng/mL (ref 30–100)

## 2017-06-06 LAB — MAGNESIUM: MAGNESIUM: 2.1 mg/dL (ref 1.5–2.5)

## 2017-06-06 LAB — HEMOGLOBIN A1C
Hgb A1c MFr Bld: 5.8 % of total Hgb — ABNORMAL HIGH (ref <5.7)
Mean Plasma Glucose: 120 (calc)
eAG (mmol/L): 6.6 (calc)

## 2017-06-06 LAB — TSH: TSH: 3.1 mIU/L (ref 0.40–4.50)

## 2017-06-07 ENCOUNTER — Encounter: Payer: Self-pay | Admitting: Internal Medicine

## 2017-09-07 NOTE — Progress Notes (Signed)
FOLLOW UP  Assessment and Plan:   Hypertension Well controlled with current medications  Monitor blood pressure at home; patient to call if consistently greater than 130/80 Continue DASH diet.   Reminder to go to the ER if any CP, SOB, nausea, dizziness, severe HA, changes vision/speech, left arm numbness and tingling and jaw pain.  Cholesterol Currently at goal; continue atorvastatin Continue low cholesterol diet and exercise.  Check lipid panel.   Prediabetes A1Cs significantly improved with weight loss Continue diet and exercise.  Perform daily foot/skin check, notify office of any concerning changes.  Check A1C  Obesity with co morbidities Long discussion about weight loss, diet, and exercise Discussed final goal weight and current weight loss goal (240 lb) Patient on phentermine with benefit and no SE, taking drug breaks; continue close follow up. Return in 3 months   Vitamin D Def At goal at last visit; continue supplementation to maintain goal of 70-100 Defer Vit D level  Continue diet and meds as discussed. Further disposition pending results of labs. Discussed med's effects and SE's.   Over 30 minutes of exam, counseling, chart review, and critical decision making was performed.   Future Appointments  Date Time Provider [place]  12/10/2017  3:45 PM Unk Pinto, MD [facility] None    ----------------------------------------------------------------------------------------------------------------------  HPI 52 y.o. male  presents for 3 month follow up on hypertension, cholesterol, prediabetes, morbid obesity and vitamin D deficiency.   he is prescribed phentermine for weight loss.  While on the medication they have lost 5 lbs since last visit. They deny palpitations, anxiety, trouble sleeping, elevated BP.   BMI is Body mass index is 35.44 kg/m., he is working on diet and exercise. He has been trying to make better diet choices, walking daily,  watching snacking.  Wt Readings from Last 3 Encounters:  09/08/17 247 lb (112 kg)  06/05/17 258 lb 12.8 oz (117.4 kg)  12/30/16 271 lb (122.9 kg)   His blood pressure has been controlled at home, today their BP is BP: 110/80  He does workout. He denies chest pain, shortness of breath, dizziness.   He is on cholesterol medication (atorvastatin 20 mg daily) and denies myalgias. His cholesterol is at goal. The cholesterol last visit was:  Lab Results  Component Value Date   CHOL 145 06/05/2017   HDL 37 (L) 06/05/2017   LDLCALC 81 06/05/2017   TRIG 171 (H) 06/05/2017   CHOLHDL 3.9 06/05/2017    He has been working on diet and exercise for prediabetes, and denies foot ulcerations, increased appetite, nausea, paresthesia of the feet, polydipsia, polyuria, visual disturbances, vomiting and weight loss. Last A1C in the office was:  Lab Results  Component Value Date   HGBA1C 5.8 (H) 06/05/2017   Patient is on Vitamin D supplement and at goal at recent check:    Lab Results  Component Value Date   VD25OH 85 06/05/2017        Current Medications:  Current Outpatient Medications on File Prior to Visit  Medication Sig  . atorvastatin (LIPITOR) 20 MG tablet TAKE 1 TABLET BY MOUTH EVERY DAY  . citalopram (CELEXA) 40 MG tablet TAKE 1 TABLET BY MOUTH EVERY DAY FOR NERVES/STRESS  . dicyclomine (BENTYL) 20 MG tablet TAKE 1 TABLET BY MOUTH 3 TIMES A DAY AS NEEDED FOR CRAMPING, BLOATING, OR NAUSEA  . lisinopril (PRINIVIL,ZESTRIL) 10 MG tablet TAKE 1 TABLET (10 MG TOTAL) BY MOUTH DAILY.  . Multiple Vitamins-Minerals (MULTIVITAMIN WITH MINERALS) tablet Take 1 tablet by  mouth daily.  . phentermine (ADIPEX-P) 37.5 MG tablet TAKE 1/2 TO 1 TABLET BY MOUTH DAILY FOR DIET AND WEIGHT LOSS  . ranitidine (ZANTAC) 300 MG tablet TAKE 1 TABLET TWICE A DAY (Patient taking differently: as needed)  . Vitamin D, Ergocalciferol, (DRISDOL) 50000 units CAPS capsule TAKE 1 CAPSULE BY MOUTH ONCE DAILY OR AS DIRECTED    No current facility-administered medications on file prior to visit.      Allergies:  Allergies  Allergen Reactions  . Bee Venom Anaphylaxis  . Chantix [Varenicline]      Medical History:  Past Medical History:  Diagnosis Date  . Anxiety   . Chest pain   . Elevated hemoglobin A1c   . Hyperlipidemia   . Hypertension   . Hypogonadism male   . Obese   . PND (post-nasal drip)   . Prediabetes   . SOB (shortness of breath) on exertion   . Vitamin D deficiency    Family history- Reviewed and unchanged Social history- Reviewed and unchanged   Review of Systems:  Review of Systems  Constitutional: Negative for malaise/fatigue and weight loss.  HENT: Negative for hearing loss and tinnitus.   Eyes: Negative for blurred vision and double vision.  Respiratory: Negative for cough, shortness of breath and wheezing.   Cardiovascular: Negative for chest pain, palpitations, orthopnea, claudication and leg swelling.  Gastrointestinal: Negative for abdominal pain, blood in stool, constipation, diarrhea, heartburn, melena, nausea and vomiting.  Genitourinary: Negative.   Musculoskeletal: Negative for joint pain and myalgias.  Skin: Negative for rash.  Neurological: Negative for dizziness, tingling, sensory change, weakness and headaches.  Endo/Heme/Allergies: Negative for polydipsia.  Psychiatric/Behavioral: Negative.   All other systems reviewed and are negative.     Physical Exam: BP 110/80   Pulse (!) 101   Temp (!) 97 F (36.1 C)   Ht 5\' 10"  (1.778 m)   Wt 247 lb (112 kg)   SpO2 98%   BMI 35.44 kg/m  Wt Readings from Last 3 Encounters:  09/08/17 247 lb (112 kg)  06/05/17 258 lb 12.8 oz (117.4 kg)  12/30/16 271 lb (122.9 kg)   General Appearance: Well nourished, in no apparent distress. Eyes: PERRLA, EOMs, conjunctiva no swelling or erythema Sinuses: No Frontal/maxillary tenderness ENT/Mouth: Ext aud canals clear, TMs without erythema, bulging. No erythema,  swelling, or exudate on post pharynx.  Tonsils not swollen or erythematous. Hearing normal.  Neck: Supple, thyroid normal.  Respiratory: Respiratory effort normal, BS equal bilaterally without rales, rhonchi, wheezing or stridor.  Cardio: RRR with no MRGs. Brisk peripheral pulses without edema.  Abdomen: Soft, + BS.  Non tender, no guarding, rebound, hernias, masses. Lymphatics: Non tender without lymphadenopathy.  Musculoskeletal: Full ROM, 5/5 strength, Normal gait Skin: Warm, dry without rashes, lesions, ecchymosis.  Neuro: Cranial nerves intact. No cerebellar symptoms.  Psych: Awake and oriented X 3, normal affect, Insight and Judgment appropriate.    Izora Ribas, NP 3:45 PM [facility]

## 2017-09-08 ENCOUNTER — Encounter: Payer: Self-pay | Admitting: Adult Health

## 2017-09-08 ENCOUNTER — Ambulatory Visit: Payer: 59 | Admitting: Adult Health

## 2017-09-08 VITALS — BP 110/80 | HR 101 | Temp 97.0°F | Ht 70.0 in | Wt 247.0 lb

## 2017-09-08 DIAGNOSIS — I10 Essential (primary) hypertension: Secondary | ICD-10-CM | POA: Diagnosis not present

## 2017-09-08 DIAGNOSIS — Z79899 Other long term (current) drug therapy: Secondary | ICD-10-CM | POA: Diagnosis not present

## 2017-09-08 DIAGNOSIS — R7303 Prediabetes: Secondary | ICD-10-CM | POA: Diagnosis not present

## 2017-09-08 DIAGNOSIS — E782 Mixed hyperlipidemia: Secondary | ICD-10-CM | POA: Diagnosis not present

## 2017-09-08 DIAGNOSIS — E559 Vitamin D deficiency, unspecified: Secondary | ICD-10-CM

## 2017-09-08 NOTE — Patient Instructions (Signed)
Aim for 7+ servings of fruits and vegetables daily  80+ fluid ounces of water or unsweet tea for healthy kidneys  Limit alcohol intake  Limit animal fats in diet for cholesterol and heart health - choose grass fed whenever available  Aim for low stress - take time to unwind and care for your mental health  Aim for 150 min of moderate intensity exercise weekly for heart health, and weights twice weekly for bone health  Aim for 7-9 hours of sleep daily      When it comes to diets, agreement about the perfect plan isn't easy to find, even among the experts. Experts at the [employer] developed an idea known as the Healthy Eating Plate. Just imagine a plate divided into logical, healthy portions.  The emphasis is on diet quality:  Load up on vegetables and fruits - one-half of your plate: Aim for color and variety, and remember that potatoes don't count.  Go for whole grains - one-quarter of your plate: Whole wheat, barley, wheat berries, quinoa, oats, brown rice, and foods made with them. If you want pasta, go with whole wheat pasta.  Protein power - one-quarter of your plate: Fish, chicken, beans, and nuts are all healthy, versatile protein sources. Limit red meat.  The diet, however, does go beyond the plate, offering a few other suggestions.  Use healthy plant oils, such as olive, canola, soy, corn, sunflower and peanut. Check the labels, and avoid partially hydrogenated oil, which have unhealthy trans fats.  If you're thirsty, drink water. Coffee and tea are good in moderation, but skip sugary drinks and limit milk and dairy products to one or two daily servings.  The type of carbohydrate in the diet is more important than the amount. Some sources of carbohydrates, such as vegetables, fruits, whole grains, and beans-are healthier than others.  Finally, stay active.  

## 2017-09-09 LAB — CBC WITH DIFFERENTIAL/PLATELET
BASOS ABS: 56 {cells}/uL (ref 0–200)
Basophils Relative: 0.6 %
EOS ABS: 122 {cells}/uL (ref 15–500)
Eosinophils Relative: 1.3 %
HCT: 40.2 % (ref 38.5–50.0)
Hemoglobin: 14.1 g/dL (ref 13.2–17.1)
Lymphs Abs: 1974 cells/uL (ref 850–3900)
MCH: 31 pg (ref 27.0–33.0)
MCHC: 35.1 g/dL (ref 32.0–36.0)
MCV: 88.4 fL (ref 80.0–100.0)
MONOS PCT: 8 %
MPV: 12.3 fL (ref 7.5–12.5)
NEUTROS PCT: 69.1 %
Neutro Abs: 6495 cells/uL (ref 1500–7800)
PLATELETS: 207 10*3/uL (ref 140–400)
RBC: 4.55 10*6/uL (ref 4.20–5.80)
RDW: 11.8 % (ref 11.0–15.0)
TOTAL LYMPHOCYTE: 21 %
WBC mixed population: 752 cells/uL (ref 200–950)
WBC: 9.4 10*3/uL (ref 3.8–10.8)

## 2017-09-09 LAB — COMPLETE METABOLIC PANEL WITH GFR
AG Ratio: 2.1 (calc) (ref 1.0–2.5)
ALBUMIN MSPROF: 4.6 g/dL (ref 3.6–5.1)
ALT: 17 U/L (ref 9–46)
AST: 15 U/L (ref 10–35)
Alkaline phosphatase (APISO): 78 U/L (ref 40–115)
BUN: 18 mg/dL (ref 7–25)
CALCIUM: 9.5 mg/dL (ref 8.6–10.3)
CO2: 27 mmol/L (ref 20–32)
CREATININE: 1 mg/dL (ref 0.70–1.33)
Chloride: 104 mmol/L (ref 98–110)
GFR, EST AFRICAN AMERICAN: 99 mL/min/{1.73_m2} (ref >=60)
GFR, Est Non African American: 86 mL/min/{1.73_m2} (ref >=60)
GLUCOSE: 95 mg/dL (ref 65–99)
Globulin: 2.2 g/dL (calc) (ref 1.9–3.7)
Potassium: 4 mmol/L (ref 3.5–5.3)
Sodium: 139 mmol/L (ref 135–146)
TOTAL PROTEIN: 6.8 g/dL (ref 6.1–8.1)
Total Bilirubin: 0.7 mg/dL (ref 0.2–1.2)

## 2017-09-09 LAB — HEMOGLOBIN A1C
Hgb A1c MFr Bld: 5.5 % of total Hgb (ref <5.7)
MEAN PLASMA GLUCOSE: 111 (calc)
eAG (mmol/L): 6.2 (calc)

## 2017-09-09 LAB — LIPID PANEL
CHOL/HDL RATIO: 3.4 (calc) (ref <5.0)
CHOLESTEROL: 145 mg/dL (ref <200)
HDL: 43 mg/dL (ref >40)
LDL Cholesterol (Calc): 86 mg/dL (calc)
Non-HDL Cholesterol (Calc): 102 mg/dL (calc) (ref <130)
Triglycerides: 75 mg/dL (ref <150)

## 2017-09-09 LAB — TSH: TSH: 2.48 mIU/L (ref 0.40–4.50)

## 2017-09-20 ENCOUNTER — Other Ambulatory Visit: Payer: Self-pay | Admitting: Internal Medicine

## 2017-10-08 ENCOUNTER — Encounter: Payer: Self-pay | Admitting: Internal Medicine

## 2017-10-26 ENCOUNTER — Other Ambulatory Visit: Payer: Self-pay | Admitting: Internal Medicine

## 2017-10-26 DIAGNOSIS — E66813 Obesity, class 3: Secondary | ICD-10-CM

## 2017-10-26 DIAGNOSIS — Z6841 Body Mass Index (BMI) 40.0 and over, adult: Principal | ICD-10-CM

## 2017-10-27 ENCOUNTER — Other Ambulatory Visit: Payer: Self-pay | Admitting: Internal Medicine

## 2017-11-15 ENCOUNTER — Other Ambulatory Visit: Payer: Self-pay | Admitting: Internal Medicine

## 2017-12-10 ENCOUNTER — Encounter: Payer: Self-pay | Admitting: Internal Medicine

## 2017-12-10 ENCOUNTER — Ambulatory Visit: Payer: 59 | Admitting: Internal Medicine

## 2017-12-10 VITALS — BP 124/84 | HR 72 | Temp 97.3°F | Resp 18 | Ht 70.0 in | Wt 240.4 lb

## 2017-12-10 DIAGNOSIS — I10 Essential (primary) hypertension: Secondary | ICD-10-CM | POA: Diagnosis not present

## 2017-12-10 DIAGNOSIS — Z0001 Encounter for general adult medical examination with abnormal findings: Secondary | ICD-10-CM

## 2017-12-10 DIAGNOSIS — Z79899 Other long term (current) drug therapy: Secondary | ICD-10-CM

## 2017-12-10 DIAGNOSIS — Z0000 Encounter for general adult medical examination without abnormal findings: Secondary | ICD-10-CM

## 2017-12-10 DIAGNOSIS — Z131 Encounter for screening for diabetes mellitus: Secondary | ICD-10-CM | POA: Diagnosis not present

## 2017-12-10 DIAGNOSIS — Z136 Encounter for screening for cardiovascular disorders: Secondary | ICD-10-CM

## 2017-12-10 DIAGNOSIS — Z111 Encounter for screening for respiratory tuberculosis: Secondary | ICD-10-CM | POA: Diagnosis not present

## 2017-12-10 DIAGNOSIS — Z1389 Encounter for screening for other disorder: Secondary | ICD-10-CM

## 2017-12-10 DIAGNOSIS — Z1212 Encounter for screening for malignant neoplasm of rectum: Secondary | ICD-10-CM

## 2017-12-10 DIAGNOSIS — Z130 Encounter for screening for diseases of the blood and blood-forming organs and certain disorders involving the immune mechanism: Secondary | ICD-10-CM | POA: Diagnosis not present

## 2017-12-10 DIAGNOSIS — E559 Vitamin D deficiency, unspecified: Secondary | ICD-10-CM | POA: Diagnosis not present

## 2017-12-10 DIAGNOSIS — Z13220 Encounter for screening for lipoid disorders: Secondary | ICD-10-CM | POA: Diagnosis not present

## 2017-12-10 DIAGNOSIS — Z125 Encounter for screening for malignant neoplasm of prostate: Secondary | ICD-10-CM

## 2017-12-10 DIAGNOSIS — R5383 Other fatigue: Secondary | ICD-10-CM

## 2017-12-10 DIAGNOSIS — Z8249 Family history of ischemic heart disease and other diseases of the circulatory system: Secondary | ICD-10-CM

## 2017-12-10 DIAGNOSIS — K219 Gastro-esophageal reflux disease without esophagitis: Secondary | ICD-10-CM

## 2017-12-10 DIAGNOSIS — Z87891 Personal history of nicotine dependence: Secondary | ICD-10-CM

## 2017-12-10 DIAGNOSIS — E291 Testicular hypofunction: Secondary | ICD-10-CM

## 2017-12-10 DIAGNOSIS — Z1329 Encounter for screening for other suspected endocrine disorder: Secondary | ICD-10-CM | POA: Diagnosis not present

## 2017-12-10 DIAGNOSIS — E782 Mixed hyperlipidemia: Secondary | ICD-10-CM

## 2017-12-10 DIAGNOSIS — R7303 Prediabetes: Secondary | ICD-10-CM

## 2017-12-10 NOTE — Patient Instructions (Signed)

## 2017-12-10 NOTE — Progress Notes (Signed)
[facility]ULT [facility]   Unk Pinto, M.D.     Uvaldo Bristle. Silverio Lay, P.A.-C Liane Comber, [place]                [address] [place], [place] [address] Telephone [phone] Telefax [phone] Annual  Screening/Preventative Visit  & Comprehensive Evaluation & Examination     This very nice 52 y.o. MWM presents for a Screening /Preventative Visit & comprehensive evaluation and management of multiple medical co-morbidities.  Patient has been followed for HTN, HLD, hx/o Prediabetes and Vitamin D Deficiency. Patient has lost 50# over the past year from 290# in May 2018 to current weight 240# .      HTN predates circa 1999. Patient's BP has been controlled at home.  Today's BP rechecked at goal - 124/84. Patient denies any cardiac symptoms as chest pain, palpitations, shortness of breath, dizziness or ankle swelling.     Patient's hyperlipidemia is controlled with diet and medications. Patient denies myalgias or other medication SE's. Last lipids were  Lab Results  Component Value Date   CHOL 145 09/08/2017   HDL 43 09/08/2017   LDLCALC 86 09/08/2017   TRIG 75 09/08/2017   CHOLHDL 3.4 09/08/2017      Patient has hx/o prediabetes since    and patient denies reactive hypoglycemic symptoms, visual blurring, diabetic polys or paresthesias. Last A1c was Normal & at goal: Lab Results  Component Value Date   HGBA1C 5.5 09/08/2017       Finally, patient has history of Vitamin D Deficiency ("16"/2008)  and last vitamin D was at goal:  Lab Results  Component Value Date   VD25OH 85 06/05/2017   Current Outpatient Medications on File Prior to Visit  Medication Sig  . atorvastatin (LIPITOR) 20 MG tablet TAKE 1 TABLET BY MOUTH EVERY DAY  . Cinnamon 500 MG TABS Take 1 tablet by mouth 2 (two) times daily.  . citalopram (CELEXA) 40 MG tablet TAKE 1 TABLET BY MOUTH EVERY DAY FOR NERVES/STRESS  . dicyclomine  (BENTYL) 20 MG tablet TAKE 1 TABLET BY MOUTH 3 TIMES A DAY AS NEEDED FOR CRAMPING, BLOATING, OR NAUSEA  . lisinopril (PRINIVIL,ZESTRIL) 10 MG tablet TAKE 1 TABLET (10 MG TOTAL) BY MOUTH DAILY.  . Multiple Vitamins-Minerals (MULTIVITAMIN WITH MINERALS) tablet Take 1 tablet by mouth daily.  . phentermine (ADIPEX-P) 37.5 MG tablet TAKE 1/2 TO 1 TABLET BY MOUTH DAILY FOR DIET AND WEIGHT LOSS  . ranitidine (ZANTAC) 300 MG tablet TAKE 1 TABLET TWICE A DAY  . Vitamin D, Ergocalciferol, (DRISDOL) 50000 units CAPS capsule TAKE 1 CAPSULE BY MOUTH ONCE DAILY OR AS DIRECTED   No current facility-administered medications on file prior to visit.    Allergies  Allergen Reactions  . Bee Venom Anaphylaxis  . Chantix [Varenicline]    Past Medical History:  Diagnosis Date  . Anxiety   . Chest pain   . Elevated hemoglobin A1c   . Hyperlipidemia   . Hypertension   . Hypogonadism male   . Obese   . PND (post-nasal drip)   . Prediabetes   . SOB (shortness of breath) on exertion   . Vitamin D deficiency    Health Maintenance  Topic Date Due  . Hepatitis C Screening  09/09/2018 (Originally 1966/02/15)  . COLONOSCOPY  10/14/2019  . TETANUS/TDAP  05/05/2022  . INFLUENZA VACCINE  Completed  .  HIV Screening  Discontinued   Immunization History  Administered Date(s) Administered  . Influenza Inj Mdck Quad With Preservative 02/28/2017  . Influenza Split 02/01/2013, 03/11/2014  . PPD Test 11/18/2013, 07/18/2014, 07/20/2015, 09/05/2016, 12/10/2017  . Pneumococcal Polysaccharide-23 11/05/2012  . Pneumococcal-Unspecified 05/06/1998  . Td 05/06/2001  . Tdap 05/05/2012  . Zoster Recombinat (Shingrix) 09/27/2017   Last Colon - 10/19/2014 Tubular adenoma - Dr Deatra Ina  and recc 5 year f/u due June 2021  Past Surgical History:  Procedure Laterality Date  . LEFT HEART CATHETERIZATION WITH CORONARY ANGIOGRAM N/A 02/19/2013   Procedure: LEFT HEART CATHETERIZATION WITH CORONARY ANGIOGRAM;  Surgeon: Josue Hector, MD;  Location: [facility] CATH LAB;  Service: Cardiovascular;  Laterality: N/A;  . nasal ostea    . SQUAMOUS CELL CARCINOMA EXCISION  2004   Family History  Problem Relation Age of Onset  . Hypertension Mother   . COPD Mother   . Diabetes Sister   . Cancer Father        Lung cancer  . Colon cancer Neg Hx    Social History   Socioeconomic History  . Marital status: Married    Spouse name: Not on file  . Number of children: 2  Occupational History  . Plant management - [employer]  Tobacco Use  . Smoking status: Former Smoker    Last attempt to quit: 05/01/2012    Years since quitting: 5.6  . Smokeless tobacco: Never Used  Substance and Sexual Activity  . Alcohol use: Yes    Alcohol/week: 0.0 oz - very little  . Drug use: No  . Sexual activity: Not on file    ROS Constitutional: Denies fever, chills, weight loss/gain, headaches, insomnia,  night sweats or change in appetite. Does c/o fatigue. Eyes: Denies redness, blurred vision, diplopia, discharge, itchy or watery eyes.  ENT: Denies discharge, congestion, post nasal drip, epistaxis, sore throat, earache, hearing loss, dental pain, Tinnitus, Vertigo, Sinus pain or snoring.  Cardio: Denies chest pain, palpitations, irregular heartbeat, syncope, dyspnea, diaphoresis, orthopnea, PND, claudication or edema Respiratory: denies cough, dyspnea, DOE, pleurisy, hoarseness, laryngitis or wheezing.  Gastrointestinal: Denies dysphagia, heartburn, reflux, water brash, pain, cramps, nausea, vomiting, bloating, diarrhea, constipation, hematemesis, melena, hematochezia, jaundice or hemorrhoids Genitourinary: Denies dysuria, frequency, urgency, nocturia, hesitancy, discharge, hematuria or flank pain Musculoskeletal: Denies arthralgia, myalgia, stiffness, Jt. Swelling, pain, limp or strain/sprain. Denies Falls. Skin: Denies puritis, rash, hives, warts, acne, eczema or change in skin lesion Neuro: No weakness, tremor, incoordination,  spasms, paresthesia or pain Psychiatric: Denies confusion, memory loss or sensory loss. Denies Depression. Endocrine: Denies change in weight, skin, hair change, nocturia, and paresthesia, diabetic polys, visual blurring or hyper / hypo glycemic episodes.  Heme/Lymph: No excessive bleeding, bruising or enlarged lymph nodes.  Physical Exam  BP 124/84   Pulse 72   Temp (!) 97.3 F (36.3 C)   Resp 18   Ht 5\' 10"  (1.778 m)   Wt 240 lb 6.4 oz (109 kg)   BMI 34.49 kg/m   General Appearance: Well nourished and well groomed and in no apparent distress.  Eyes: PERRLA, EOMs, conjunctiva no swelling or erythema, normal fundi and vessels. Sinuses: No frontal/maxillary tenderness ENT/Mouth: EACs patent / TMs  nl. Nares clear without erythema, swelling, mucoid exudates. Oral hygiene is good. No erythema, swelling, or exudate. Tongue normal, non-obstructing. Tonsils not swollen or erythematous. Hearing normal.  Neck: Supple, thyroid not palpable. No bruits, nodes or JVD. Respiratory: Respiratory effort normal.  BS equal and clear  bilateral without rales, rhonci, wheezing or stridor. Cardio: Heart sounds are normal with regular rate and rhythm and no murmurs, rubs or gallops. Peripheral pulses are normal and equal bilaterally without edema. No aortic or femoral bruits. Chest: symmetric with normal excursions and percussion.  Abdomen: Soft, with Nl bowel sounds. Nontender, no guarding, rebound, hernias, masses, or organomegaly.  Lymphatics: Non tender without lymphadenopathy.  Genitourinary: No hernias.Testes nl. DRE - prostate nl for age - smooth & firm w/o nodules. Musculoskeletal: Full ROM all peripheral extremities, joint stability, 5/5 strength, and normal gait. Skin: Warm and dry without rashes, lesions, cyanosis, clubbing or  ecchymosis.  Neuro: Cranial nerves intact, reflexes equal bilaterally. Normal muscle tone, no cerebellar symptoms. Sensation intact.  Pysch: Alert and oriented X 3 with  normal affect, insight and judgment appropriate.   Assessment and Plan  1. Annual Preventative/Screening Exam   2. Essential hypertension  - EKG 12-Lead - [place], RETROPERITNL ABD,  LTD - Urinalysis, Routine w reflex microscopic - Microalbumin / creatinine urine ratio - CBC with Differential/Platelet - COMPLETE METABOLIC PANEL WITH GFR - Magnesium - TSH  3. Hyperlipidemia, mixed  - Lipid panel - TSH  4. Prediabetes  - EKG 12-Lead - [place], RETROPERITNL ABD,  LTD - Hemoglobin A1c - Insulin, random  5. Vitamin D deficiency  - VITAMIN D 25 Hydroxyl  6. Testosterone Deficiency  - CBC with Differential/Platelet  7. Gastroesophageal reflux disease   8. Screening for rectal cancer  - POC Hemoccult Bld/Stl (3-Cd Home Screen); Future  9. Prostate cancer screening  - PSA  10. Screening examination for pulmonary tuberculosis  - PPD  11. Screening for ischemic heart disease  - EKG 12-Lead  12. FH: hypertension  - EKG 12-Lead - [place], RETROPERITNL ABD,  LTD  13. Former smoker  - EKG 12-Lead - [place], RETROPERITNL ABD,  LTD  14. Screening for AAA (aortic abdominal aneurysm)  - [place], RETROPERITNL ABD,  LTD  15. Fatigue  - Iron,Total/Total Iron Binding Cap - Vitamin B12 - Testosterone - CBC with Differential/Platelet  16. Medication management  - Urinalysis, Routine w reflex microscopic - Microalbumin / creatinine urine ratio - CBC with Differential/Platelet - COMPLETE METABOLIC PANEL WITH GFR - Magnesium - Lipid panel - TSH - Hemoglobin A1c - Insulin, random - VITAMIN D 25 Hydroxyl       Patient was counseled in prudent diet, weight control to achieve/maintain BMI less than 25, BP monitoring, regular exercise and medications as discussed.  Discussed med effects and SE's. Routine screening labs and tests as requested with regular follow-up as recommended. Over 40 minutes of exam, counseling, chart review and high complex critical decision making was  performed

## 2017-12-11 ENCOUNTER — Encounter: Payer: Self-pay | Admitting: Internal Medicine

## 2017-12-11 LAB — URINALYSIS, ROUTINE W REFLEX MICROSCOPIC
BILIRUBIN URINE: NEGATIVE
Glucose, UA: NEGATIVE
HGB URINE DIPSTICK: NEGATIVE
KETONES UR: NEGATIVE
Leukocytes, UA: NEGATIVE
Nitrite: NEGATIVE
PROTEIN: NEGATIVE
Specific Gravity, Urine: 1.02 (ref 1.001–1.03)
pH: 5.5 (ref 5.0–8.0)

## 2017-12-11 LAB — COMPLETE METABOLIC PANEL WITH GFR
AG Ratio: 2.3 (calc) (ref 1.0–2.5)
ALBUMIN MSPROF: 5 g/dL (ref 3.6–5.1)
ALKALINE PHOSPHATASE (APISO): 79 U/L (ref 40–115)
ALT: 19 U/L (ref 9–46)
AST: 16 U/L (ref 10–35)
BILIRUBIN TOTAL: 0.9 mg/dL (ref 0.2–1.2)
BUN: 22 mg/dL (ref 7–25)
CO2: 27 mmol/L (ref 20–32)
CREATININE: 1.12 mg/dL (ref 0.70–1.33)
Calcium: 9.9 mg/dL (ref 8.6–10.3)
Chloride: 101 mmol/L (ref 98–110)
GFR, Est African American: 86 mL/min/{1.73_m2} (ref >=60)
GFR, Est Non African American: 75 mL/min/{1.73_m2} (ref >=60)
GLUCOSE: 100 mg/dL — AB (ref 65–99)
Globulin: 2.2 g/dL (calc) (ref 1.9–3.7)
Potassium: 4.3 mmol/L (ref 3.5–5.3)
Sodium: 138 mmol/L (ref 135–146)
Total Protein: 7.2 g/dL (ref 6.1–8.1)

## 2017-12-11 LAB — LIPID PANEL
Cholesterol: 142 mg/dL (ref <200)
HDL: 45 mg/dL (ref >40)
LDL CHOLESTEROL (CALC): 83 mg/dL
NON-HDL CHOLESTEROL (CALC): 97 mg/dL (ref <130)
TRIGLYCERIDES: 65 mg/dL (ref <150)
Total CHOL/HDL Ratio: 3.2 (calc) (ref <5.0)

## 2017-12-11 LAB — CBC WITH DIFFERENTIAL/PLATELET
BASOS ABS: 67 {cells}/uL (ref 0–200)
BASOS PCT: 0.7 %
EOS ABS: 76 {cells}/uL (ref 15–500)
Eosinophils Relative: 0.8 %
HCT: 43 % (ref 38.5–50.0)
HEMOGLOBIN: 15.1 g/dL (ref 13.2–17.1)
LYMPHS ABS: 2090 {cells}/uL (ref 850–3900)
MCH: 30.9 pg (ref 27.0–33.0)
MCHC: 35.1 g/dL (ref 32.0–36.0)
MCV: 87.9 fL (ref 80.0–100.0)
MONOS PCT: 6.6 %
MPV: 12.1 fL (ref 7.5–12.5)
NEUTROS ABS: 6641 {cells}/uL (ref 1500–7800)
Neutrophils Relative %: 69.9 %
Platelets: 237 10*3/uL (ref 140–400)
RBC: 4.89 10*6/uL (ref 4.20–5.80)
RDW: 11.5 % (ref 11.0–15.0)
Total Lymphocyte: 22 %
WBC mixed population: 627 cells/uL (ref 200–950)
WBC: 9.5 10*3/uL (ref 3.8–10.8)

## 2017-12-11 LAB — MICROALBUMIN / CREATININE URINE RATIO
Creatinine, Urine: 199 mg/dL (ref 20–320)
Microalb Creat Ratio: 6 mcg/mg creat (ref <30)
Microalb, Ur: 1.1 mg/dL

## 2017-12-11 LAB — HEMOGLOBIN A1C
Hgb A1c MFr Bld: 5.5 % of total Hgb (ref <5.7)
Mean Plasma Glucose: 111 (calc)
eAG (mmol/L): 6.2 (calc)

## 2017-12-11 LAB — PSA: PSA: 0.3 ng/mL (ref <=4.0)

## 2017-12-11 LAB — TSH: TSH: 2.37 m[IU]/L (ref 0.40–4.50)

## 2017-12-11 LAB — IRON, TOTAL/TOTAL IRON BINDING CAP
%SAT: 32 % (calc) (ref 20–48)
IRON: 96 ug/dL (ref 50–180)
TIBC: 301 ug/dL (ref 250–425)

## 2017-12-11 LAB — VITAMIN B12: Vitamin B-12: 395 pg/mL (ref 200–1100)

## 2017-12-11 LAB — VITAMIN D 25 HYDROXY (VIT D DEFICIENCY, FRACTURES): Vit D, 25-Hydroxy: 93 ng/mL (ref 30–100)

## 2017-12-11 LAB — MAGNESIUM: Magnesium: 2.1 mg/dL (ref 1.5–2.5)

## 2017-12-11 LAB — INSULIN, RANDOM: Insulin: 21.9 u[IU]/mL — ABNORMAL HIGH (ref 2.0–19.6)

## 2017-12-11 LAB — TESTOSTERONE: Testosterone: 315 ng/dL (ref 250–827)

## 2017-12-21 ENCOUNTER — Other Ambulatory Visit: Payer: Self-pay | Admitting: Internal Medicine

## 2018-02-19 ENCOUNTER — Other Ambulatory Visit: Payer: Self-pay | Admitting: Internal Medicine

## 2018-02-19 DIAGNOSIS — Z6841 Body Mass Index (BMI) 40.0 and over, adult: Principal | ICD-10-CM

## 2018-03-01 ENCOUNTER — Other Ambulatory Visit: Payer: Self-pay | Admitting: Internal Medicine

## 2018-03-24 ENCOUNTER — Ambulatory Visit: Payer: Self-pay | Admitting: Physician Assistant

## 2018-04-14 ENCOUNTER — Ambulatory Visit: Payer: Self-pay | Admitting: Physician Assistant

## 2018-04-21 NOTE — Progress Notes (Signed)
FOLLOW UP  Assessment and Plan:   Hypertension Well controlled with current medications  Monitor blood pressure at home; patient to call if consistently greater than 130/80 Continue DASH diet.   Reminder to go to the ER if any CP, SOB, nausea, dizziness, severe HA, changes vision/speech, left arm numbness and tingling and jaw pain.  Cholesterol Currently at goal; continue atorvastatin Continue low cholesterol diet and exercise.  Check lipid panel.   Prediabetes A1Cs significantly improved with weight loss Continue diet and exercise.  Perform daily foot/skin check, notify office of any concerning changes.  Check A1C  Obesity with co morbidities Long discussion about weight loss, diet, and exercise Discussed final goal weight and current weight loss goal (240 lb) Patient on phentermine with benefit and no SE, taking drug breaks; continue close follow up. Return in 3 months   Vitamin D Def At goal at last visit; continue supplementation to maintain goal of 70-100 Defer Vit D level  Right shoulder pain Continue aleve Will refer to PT Referral to ortho ? Rotator cuff tendonitis/tear and impingement  Continue diet and meds as discussed. Further disposition pending results of labs. Discussed med's effects and SE's.   Over 30 minutes of exam, counseling, chart review, and critical decision making was performed.   Future Appointments  Date Time Provider [place]  04/22/2018  3:30 PM Vicie Mutters, PA-C [facility] None  06/29/2018  4:00 PM Unk Pinto, MD [facility] None  01/13/2019  3:00 PM Unk Pinto, MD [facility] None    ----------------------------------------------------------------------------------------------------------------------  HPI 52 y.o. male  presents for 3 month follow up on hypertension, cholesterol, prediabetes, morbid obesity and vitamin D deficiency.   He is right handed. In august he slipped in his garage and fell onto right  shoulder, will have anterior burning pain, will feel a catch with grinding and popping. Bothers his the post with golfing/fishing, takes ibuprofen.   BMI is Body mass index is 34.87 kg/m., he is working on diet and exercise. He is still on phentermine but states that he is not taking it daily.  He has been trying to make better diet choices, walking daily, watching snacking.  Wt Readings from Last 3 Encounters:  04/22/18 243 lb (110.2 kg)  12/10/17 240 lb 6.4 oz (109 kg)  09/08/17 247 lb (112 kg)   His blood pressure has been controlled at home, today their BP is BP: 118/72  He does workout. He denies chest pain, shortness of breath, dizziness.   He is on cholesterol medication (atorvastatin 20 mg daily) and denies myalgias. His cholesterol is at goal. The cholesterol last visit was:  Lab Results  Component Value Date   CHOL 142 12/10/2017   HDL 45 12/10/2017   LDLCALC 83 12/10/2017   TRIG 65 12/10/2017   CHOLHDL 3.2 12/10/2017    He has been working on diet and exercise for prediabetes, and denies foot ulcerations, increased appetite, nausea, paresthesia of the feet, polydipsia, polyuria, visual disturbances, vomiting and weight loss. Last A1C in the office was:  Lab Results  Component Value Date   HGBA1C 5.5 12/10/2017   Patient is on Vitamin D supplement and at goal at recent check:    Lab Results  Component Value Date   VD25OH 93 12/10/2017        Current Medications:  Current Outpatient Medications on File Prior to Visit  Medication Sig  . atorvastatin (LIPITOR) 20 MG tablet TAKE 1 TABLET BY MOUTH EVERY DAY  . Cinnamon 500 MG TABS Take  1 tablet by mouth 2 (two) times daily.  . citalopram (CELEXA) 40 MG tablet TAKE 1 TABLET BY MOUTH EVERY DAY FOR NERVES/STRESS  . dicyclomine (BENTYL) 20 MG tablet TAKE 1 TABLET BY MOUTH 3 TIMES A DAY AS NEEDED FOR CRAMPING, BLOATING, OR NAUSEA  . lisinopril (PRINIVIL,ZESTRIL) 10 MG tablet TAKE 1 TABLET (10 MG TOTAL) BY MOUTH DAILY.  .  Multiple Vitamins-Minerals (MULTIVITAMIN WITH MINERALS) tablet Take 1 tablet by mouth daily.  . phentermine (ADIPEX-P) 37.5 MG tablet TAKE 1/2 TO 1 TABLET BY MOUTH DAILY FOR DIET AND WEIGHT LOSS  . ranitidine (ZANTAC) 300 MG tablet TAKE 1 TABLET TWICE A DAY  . Vitamin D, Ergocalciferol, (DRISDOL) 50000 units CAPS capsule TAKE 1 CAPSULE BY MOUTH ONCE DAILY OR AS DIRECTED   No current facility-administered medications on file prior to visit.      Allergies:  Allergies  Allergen Reactions  . Bee Venom Anaphylaxis  . Chantix [Varenicline]      Medical History:  Past Medical History:  Diagnosis Date  . Anxiety   . Chest pain   . Elevated hemoglobin A1c   . Hyperlipidemia   . Hypertension   . Hypogonadism male   . Obese   . PND (post-nasal drip)   . Prediabetes   . SOB (shortness of breath) on exertion   . Vitamin D deficiency    Family history- Reviewed and unchanged Social history- Reviewed and unchanged   Review of Systems:  Review of Systems  Constitutional: Negative for malaise/fatigue and weight loss.  HENT: Negative for hearing loss and tinnitus.   Eyes: Negative for blurred vision and double vision.  Respiratory: Negative for cough, shortness of breath and wheezing.   Cardiovascular: Negative for chest pain, palpitations, orthopnea, claudication and leg swelling.  Gastrointestinal: Negative for abdominal pain, blood in stool, constipation, diarrhea, heartburn, melena, nausea and vomiting.  Genitourinary: Negative.   Musculoskeletal: Positive for falls and joint pain. Negative for back pain, myalgias and neck pain.  Skin: Negative for rash.  Neurological: Negative for dizziness, tingling, sensory change, weakness and headaches.  Endo/Heme/Allergies: Negative for polydipsia.  Psychiatric/Behavioral: Negative.   All other systems reviewed and are negative.     Physical Exam: BP 118/72   Pulse 98   Temp (!) 97.2 F (36.2 C)   Ht 5\' 10"  (1.778 m)   Wt 243 lb  (110.2 kg)   SpO2 98%   BMI 34.87 kg/m  Wt Readings from Last 3 Encounters:  04/22/18 243 lb (110.2 kg)  12/10/17 240 lb 6.4 oz (109 kg)  09/08/17 247 lb (112 kg)   General Appearance: Well nourished, in no apparent distress. Eyes: PERRLA, EOMs, conjunctiva no swelling or erythema Sinuses: No Frontal/maxillary tenderness ENT/Mouth: Ext aud canals clear, TMs without erythema, bulging. No erythema, swelling, or exudate on post pharynx.  Tonsils not swollen or erythematous. Hearing normal.  Neck: Supple, thyroid normal.  Respiratory: Respiratory effort normal, BS equal bilaterally without rales, rhonchi, wheezing or stridor.  Cardio: RRR with no MRGs. Brisk peripheral pulses without edema.  Abdomen: Soft, + BS.  Non tender, no guarding, rebound, hernias, masses. Lymphatics: Non tender without lymphadenopathy.  Musculoskeletal: Full ROM, 5/5 strength, Normal gait, Shoulder right: Full range of motion. Neurovascularly intact distally. Good strength with stress of rotator cuff but causes pain. Positive impingement signs. Skin: Warm, dry without rashes, lesions, ecchymosis.  Neuro: Cranial nerves intact. No cerebellar symptoms.  Psych: Awake and oriented X 3, normal affect, Insight and Judgment appropriate.    Estill Bamberg  Silverio Lay, PA-C 3:29 PM [facility]

## 2018-04-22 ENCOUNTER — Encounter: Payer: Self-pay | Admitting: Physician Assistant

## 2018-04-22 ENCOUNTER — Ambulatory Visit: Payer: 59 | Admitting: Physician Assistant

## 2018-04-22 VITALS — BP 118/72 | HR 98 | Temp 97.2°F | Ht 70.0 in | Wt 243.0 lb

## 2018-04-22 DIAGNOSIS — E291 Testicular hypofunction: Secondary | ICD-10-CM | POA: Diagnosis not present

## 2018-04-22 DIAGNOSIS — Z79899 Other long term (current) drug therapy: Secondary | ICD-10-CM

## 2018-04-22 DIAGNOSIS — E782 Mixed hyperlipidemia: Secondary | ICD-10-CM | POA: Diagnosis not present

## 2018-04-22 DIAGNOSIS — I10 Essential (primary) hypertension: Secondary | ICD-10-CM | POA: Diagnosis not present

## 2018-04-22 DIAGNOSIS — M25511 Pain in right shoulder: Secondary | ICD-10-CM

## 2018-04-22 NOTE — Patient Instructions (Signed)
WATER IS IMPORTANT  Being dehydrated can hurt your kidneys, cause fatigue, headaches, muscle aches, joint pain, and dry skin/nails so please increase your fluids.   Drink 80-100 oz a day of water, measure it out! Eat 3 meals a day, have to do breakfast, eat protein- hard boiled eggs, protein bar like nature [place] protein bar, greek yogurt like oikos triple zero, chobani 100, or light n fit greek  Can check out plantnanny app on your phone to help you keep track of your water    Check out  Mini habits for weight loss book  2 apps for tracking food is myfitness pal  loseit OR can take picture of your food   Google mindful eating and here are some tips and tricks below.   Rate your hunger before you eat on a scale of 1-10, try to eat closer to a 6 or higher. And if you are at below that, why are you eating? Slow down and listen to your body.          When it comes to diets, agreement about the perfect plan isn't easy to find, even among the experts. Experts at the [place] developed an idea known as the Healthy Eating Plate. Just imagine a plate divided into logical, healthy portions.  The emphasis is on diet quality:  Load up on vegetables and fruits - one-half of your plate: Aim for color and variety, and remember that potatoes don't count.  Go for whole grains - one-quarter of your plate: Whole wheat, barley, wheat berries, quinoa, oats, brown rice, and foods made with them. If you want pasta, go with whole wheat pasta.  Protein power - one-quarter of your plate: Fish, chicken, beans, and nuts are all healthy, versatile protein sources. Limit red meat.  The diet, however, does go beyond the plate, offering a few other suggestions.  Use healthy plant oils, such as olive, canola, soy, corn, sunflower and peanut. Check the labels, and avoid partially hydrogenated oil, which have unhealthy trans fats.  If you're thirsty, drink water. Coffee and tea are  good in moderation, but skip sugary drinks and limit milk and dairy products to one or two daily servings.  The type of carbohydrate in the diet is more important than the amount. Some sources of carbohydrates, such as vegetables, fruits, whole grains, and beans-are healthier than others.  Finally, stay active.

## 2018-05-04 ENCOUNTER — Other Ambulatory Visit: Payer: Self-pay

## 2018-05-04 MED ORDER — CITALOPRAM HYDROBROMIDE 40 MG PO TABS: 40.0000 mg | ORAL_TABLET | Freq: Every day | ORAL | 0 refills | Status: DC

## 2018-05-20 ENCOUNTER — Other Ambulatory Visit: Payer: Self-pay | Admitting: Internal Medicine

## 2018-05-20 ENCOUNTER — Ambulatory Visit: Payer: PRIVATE HEALTH INSURANCE | Attending: Physician Assistant

## 2018-05-26 ENCOUNTER — Ambulatory Visit: Payer: PRIVATE HEALTH INSURANCE

## 2018-05-28 ENCOUNTER — Ambulatory Visit: Payer: PRIVATE HEALTH INSURANCE

## 2018-06-02 ENCOUNTER — Ambulatory Visit: Payer: PRIVATE HEALTH INSURANCE

## 2018-06-18 ENCOUNTER — Other Ambulatory Visit: Payer: Self-pay | Admitting: Adult Health

## 2018-06-18 DIAGNOSIS — K589 Irritable bowel syndrome without diarrhea: Secondary | ICD-10-CM

## 2018-06-26 ENCOUNTER — Other Ambulatory Visit: Payer: Self-pay | Admitting: Internal Medicine

## 2018-06-29 ENCOUNTER — Ambulatory Visit: Payer: Self-pay | Admitting: Internal Medicine

## 2018-07-16 ENCOUNTER — Other Ambulatory Visit: Payer: Self-pay | Admitting: Adult Health

## 2018-07-16 DIAGNOSIS — E66813 Obesity, class 3: Secondary | ICD-10-CM

## 2018-07-16 DIAGNOSIS — Z6841 Body Mass Index (BMI) 40.0 and over, adult: Principal | ICD-10-CM

## 2018-08-24 ENCOUNTER — Other Ambulatory Visit: Payer: Self-pay | Admitting: Internal Medicine

## 2018-09-04 ENCOUNTER — Other Ambulatory Visit: Payer: Self-pay | Admitting: Internal Medicine

## 2018-09-16 ENCOUNTER — Other Ambulatory Visit: Payer: Self-pay | Admitting: Adult Health

## 2018-09-16 ENCOUNTER — Other Ambulatory Visit: Payer: Self-pay | Admitting: Internal Medicine

## 2018-09-16 MED ORDER — CITALOPRAM HYDROBROMIDE 40 MG PO TABS: ORAL_TABLET | ORAL | 1 refills | Status: DC

## 2018-10-19 ENCOUNTER — Other Ambulatory Visit: Payer: Self-pay | Admitting: Adult Health

## 2019-01-12 NOTE — Patient Instructions (Signed)

## 2019-01-12 NOTE — Progress Notes (Signed)
Annual  Screening/Preventative Visit  & Comprehensive Evaluation & Examination     This very nice 53 y.o. MWM presents for a Screening /Preventative Visit & comprehensive evaluation and management of multiple medical co-morbidities.  Patient has been followed for HTN, HLD, Prediabetes and Vitamin D Deficiency.  In 2018, patient lost 50# from 290# to down to 240#.     HTN predates since 1999. Patient's BP has been controlled at home.  Today's BP is at goal -  130/76. Patient denies any cardiac symptoms as chest pain, palpitations, shortness of breath, dizziness or ankle swelling.     Patient's hyperlipidemia is not  controlled with diet and medications. Patient denies myalgias or other medication SE's. Last lipids are not at goal:  Lab Results  Component Value Date   CHOL 166 01/13/2019   HDL 42 01/13/2019   LDLCALC 103 (H) 01/13/2019   TRIG 117 01/13/2019   CHOLHDL 4.0 01/13/2019      Patient has Morbid Obesity (BMI 36+) and  is monitored for PreDiabetes (A1c 5.9% / 1999, then A1c 6.4% / 2016 and 6.6% / 2017)  and patient denies reactive hypoglycemic symptoms, visual blurring, diabetic polys or paresthesias. Last A1c was Normal & at goal: Lab Results  Component Value Date   HGBA1C 5.6 01/13/2019       Finally, patient has history of Vitamin D Deficiency ("16" / 2008)  and current  vitamin D was elevated and dose is adjusted: Lab Results  Component Value Date   VD25OH >150 (H) 01/13/2019   Current Outpatient Medications on File Prior to Visit  Medication Sig  . atorvastatin (LIPITOR) 20 MG tablet TAKE 1 TABLET BY MOUTH EVERY DAY  . Cinnamon 500 MG TABS Take 1 tablet by mouth daily.   . citalopram (CELEXA) 40 MG tablet Take 1 tablet Daily for Mood  . dicyclomine (BENTYL) 20 MG tablet TAKE 1 TABLET BY MOUTH 3 TIMES A DAY AS NEEDED FOR CRAMPING, BLOATING, OR NAUSEA  . lisinopril (ZESTRIL) 10 MG tablet Take 1 tablet Daily for BP  . Multiple Vitamins-Minerals (MULTIVITAMIN WITH  MINERALS) tablet Take 1 tablet by mouth daily.  . sildenafil (VIAGRA) 100 MG tablet Take 1/2 to 1 tablet Daily as needed for XXXX  . Vitamin D, Ergocalciferol, (DRISDOL) 1.25 MG (50000 UT) CAPS capsule TAKE 1 CAPSULE BY MOUTH ONCE DAILY OR AS DIRECTED  . phentermine (ADIPEX-P) 37.5 MG tablet Take 1 tablet every morning for Dieting & Weight Loss (Patient not taking: Reported on 01/13/2019)   No current facility-administered medications on file prior to visit.    Allergies  Allergen Reactions  . Bee Venom Anaphylaxis  . Chantix [Varenicline]    Past Medical History:  Diagnosis Date  . Anxiety   . Chest pain   . Elevated hemoglobin A1c   . Hyperlipidemia   . Hypertension   . Hypogonadism male   . Obese   . PND (post-nasal drip)   . Prediabetes   . SOB (shortness of breath) on exertion   . Vitamin D deficiency    Health Maintenance  Topic Date Due  . Hepatitis C Screening  29-Jun-1965  . INFLUENZA VACCINE  12/05/2018  . COLONOSCOPY  10/14/2019  . TETANUS/TDAP  05/05/2022  . HIV Screening  Discontinued   Immunization History  Administered Date(s) Administered  . Influenza Inj Mdck Quad With Preservative 02/28/2017  . Influenza Split 02/01/2013, 03/11/2014  . PPD Test 11/18/2013, 07/18/2014, 07/20/2015, 09/05/2016, 12/10/2017, 01/13/2019  . Pneumococcal Polysaccharide-23 11/05/2012  .  Pneumococcal-Unspecified 05/06/1998  . Td 05/06/2001  . Tdap 05/05/2012  . Zoster Recombinat (Shingrix) 09/27/2017, 12/30/2017   Last Colon - 10/19/2014 Tubular adenoma - Dr Deatra Ina  and recc 5 year f/u due June 2021   Past Surgical History:  Procedure Laterality Date  . LEFT HEART CATHETERIZATION WITH CORONARY ANGIOGRAM N/A 02/19/2013   Procedure: LEFT HEART CATHETERIZATION WITH CORONARY ANGIOGRAM;  Surgeon: Josue Hector, MD;  Location: [facility] CATH LAB;  Service: Cardiovascular;  Laterality: N/A;  . nasal ostea    . SQUAMOUS CELL CARCINOMA EXCISION  2004   Family History  Problem Relation Age  of Onset  . Hypertension Mother   . COPD Mother   . Diabetes Sister   . Cancer Father        Lung cancer  . Colon cancer Neg Hx    Social History   Socioeconomic History  . Marital status: Married    Spouse name: Amy  . Number of children: 2  Occupational History  . Psychologist, counselling at [employer]  . Smoking status: Former Smoker    Quit date: 05/01/2012    Years since quitting: 6.7  . Smokeless tobacco: Never Used  Substance and Sexual Activity  . Alcohol use: Yes    Alcohol/week: 0.0 standard drinks    Comment: very little  . Drug use: No  . Sexual activity: Not on file    ROS Constitutional: Denies fever, chills, weight loss/gain, headaches, insomnia,  night sweats or change in appetite. Does c/o fatigue. Eyes: Denies redness, blurred vision, diplopia, discharge, itchy or watery eyes.  ENT: Denies discharge, congestion, post nasal drip, epistaxis, sore throat, earache, hearing loss, dental pain, Tinnitus, Vertigo, Sinus pain or snoring.  Cardio: Denies chest pain, palpitations, irregular heartbeat, syncope, dyspnea, diaphoresis, orthopnea, PND, claudication or edema Respiratory: denies cough, dyspnea, DOE, pleurisy, hoarseness, laryngitis or wheezing.  Gastrointestinal: Denies dysphagia, heartburn, reflux, water brash, pain, cramps, nausea, vomiting, bloating, diarrhea, constipation, hematemesis, melena, hematochezia, jaundice or hemorrhoids Genitourinary: Denies dysuria, frequency, urgency, nocturia, hesitancy, discharge, hematuria or flank pain Musculoskeletal: Denies arthralgia, myalgia, stiffness, Jt. Swelling, pain, limp or strain/sprain. Denies Falls. Skin: Denies puritis, rash, hives, warts, acne, eczema or change in skin lesion Neuro: No weakness, tremor, incoordination, spasms, paresthesia or pain Psychiatric: Denies confusion, memory loss or sensory loss. Denies Depression. Endocrine: Denies change in weight, skin, hair change, nocturia, and  paresthesia, diabetic polys, visual blurring or hyper / hypo glycemic episodes.  Heme/Lymph: No excessive bleeding, bruising or enlarged lymph nodes.  Physical Exam  BP 130/76   Pulse 72   Temp (!) 97 F (36.1 C)   Resp 16   Ht 5\' 10"  (1.778 m)   Wt 256 lb 3.2 oz (116.2 kg)   BMI 36.76 kg/m   General Appearance: Over nourished, well groomed and in no apparent distress.  Eyes: PERRLA, EOMs, conjunctiva no swelling or erythema, normal fundi and vessels. Sinuses: No frontal/maxillary tenderness ENT/Mouth: EACs patent / TMs  nl. Nares clear without erythema, swelling, mucoid exudates. Oral hygiene is good. No erythema, swelling, or exudate. Tongue normal, non-obstructing. Tonsils not swollen or erythematous. Hearing normal.  Neck: Supple, thyroid not palpable. No bruits, nodes or JVD. Respiratory: Respiratory effort normal.  BS equal and clear bilateral without rales, rhonci, wheezing or stridor. Cardio: Heart sounds are normal with regular rate and rhythm and no murmurs, rubs or gallops. Peripheral pulses are normal and equal bilaterally without edema. No aortic or femoral bruits. Chest: symmetric with normal excursions  and percussion.  Abdomen: Soft, with Nl bowel sounds. Nontender, no guarding, rebound, hernias, masses, or organomegaly.  Lymphatics: Non tender without lymphadenopathy.  Musculoskeletal: Full ROM all peripheral extremities, joint stability, 5/5 strength, and normal gait. Skin: Warm and dry without rashes, lesions, cyanosis, clubbing or  ecchymosis.  Neuro: Cranial nerves intact, reflexes equal bilaterally. Normal muscle tone, no cerebellar symptoms. Sensation intact.  Pysch: Alert and oriented X 3 with normal affect, insight and judgment appropriate.   Assessment and Plan  1. Annual Preventative/Screening Exam   2. Essential hypertension  - EKG 12-Lead - [place], RETROPERITNL ABD,  LTD - Urinalysis, Routine w reflex microscopic - Microalbumin / creatinine urine ratio  - CBC with Differential/Platelet - COMPLETE METABOLIC PANEL WITH GFR - Magnesium - TSH  3. Hyperlipidemia, mixed  - EKG 12-Lead - [place], RETROPERITNL ABD,  LTD - Lipid panel - TSH  4. Abnormal glucose  - [place], RETROPERITNL ABD,  LTD - Hemoglobin A1c - Uric acid - Insulin, random  5. Vitamin D deficiency  - VITAMIN D 25 Hydroxy  6. Gastroesophageal reflux disease  - CBC with Differential/Platelet - famotidine (PEPCID) 20 MG tablet; Take 1 tablet 2 x /day as need for Indigestion & Heartburn.  Dispense: 180 tablet; Refill: 3  7. Testosterone Deficiency  - Testosterone - CBC with Differential/Platelet  8. Screening for colorectal cancer  - POC Hemoccult Bld/Stl   9. Prostate cancer screening  - PSA  10. BPH with obstruction/lower urinary tract symptoms  - PSA  11. Screening examination for pulmonary tuberculosis  - PPD  12. Screening for ischemic heart disease  - EKG 12-Lead  13. FH: hypertension  - EKG 12-Lead - [place], RETROPERITNL ABD,  LTD  14. Former smoker  - EKG 12-Lead - [place], RETROPERITNL ABD,  LTD  15. Screening for AAA (aortic abdominal aneurysm)  - [place], RETROPERITNL ABD,  LTD  16. Fatigue  - Vitamin B12 - CBC with Differential/Platelet  17. Medication management  - Urinalysis, Routine w reflex microscopic - Microalbumin / creatinine urine ratio - Testosterone  18. Prediabetes      Patient was counseled in prudent diet, weight control to achieve/maintain BMI less than 25, BP monitoring, regular exercise and medications as discussed.  Discussed med effects and SE's. Routine screening labs and tests as requested with regular follow-up as recommended. Over 40 minutes of exam, counseling, chart review and high complex critical decision making was performed   Kirtland Bouchard, MD

## 2019-01-13 ENCOUNTER — Other Ambulatory Visit: Payer: Self-pay

## 2019-01-13 ENCOUNTER — Ambulatory Visit (INDEPENDENT_AMBULATORY_CARE_PROVIDER_SITE_OTHER): Payer: 59 | Admitting: Internal Medicine

## 2019-01-13 VITALS — BP 130/76 | HR 72 | Temp 97.0°F | Resp 16 | Ht 70.0 in | Wt 256.2 lb

## 2019-01-13 DIAGNOSIS — E782 Mixed hyperlipidemia: Secondary | ICD-10-CM

## 2019-01-13 DIAGNOSIS — Z1329 Encounter for screening for other suspected endocrine disorder: Secondary | ICD-10-CM | POA: Diagnosis not present

## 2019-01-13 DIAGNOSIS — Z87891 Personal history of nicotine dependence: Secondary | ICD-10-CM | POA: Diagnosis not present

## 2019-01-13 DIAGNOSIS — Z131 Encounter for screening for diabetes mellitus: Secondary | ICD-10-CM

## 2019-01-13 DIAGNOSIS — E291 Testicular hypofunction: Secondary | ICD-10-CM

## 2019-01-13 DIAGNOSIS — Z136 Encounter for screening for cardiovascular disorders: Secondary | ICD-10-CM

## 2019-01-13 DIAGNOSIS — N401 Enlarged prostate with lower urinary tract symptoms: Secondary | ICD-10-CM

## 2019-01-13 DIAGNOSIS — Z130 Encounter for screening for diseases of the blood and blood-forming organs and certain disorders involving the immune mechanism: Secondary | ICD-10-CM

## 2019-01-13 DIAGNOSIS — Z8249 Family history of ischemic heart disease and other diseases of the circulatory system: Secondary | ICD-10-CM

## 2019-01-13 DIAGNOSIS — Z79899 Other long term (current) drug therapy: Secondary | ICD-10-CM

## 2019-01-13 DIAGNOSIS — R7303 Prediabetes: Secondary | ICD-10-CM

## 2019-01-13 DIAGNOSIS — I10 Essential (primary) hypertension: Secondary | ICD-10-CM

## 2019-01-13 DIAGNOSIS — Z13220 Encounter for screening for lipoid disorders: Secondary | ICD-10-CM

## 2019-01-13 DIAGNOSIS — Z0000 Encounter for general adult medical examination without abnormal findings: Secondary | ICD-10-CM | POA: Diagnosis not present

## 2019-01-13 DIAGNOSIS — Z111 Encounter for screening for respiratory tuberculosis: Secondary | ICD-10-CM | POA: Diagnosis not present

## 2019-01-13 DIAGNOSIS — K2100 Gastro-esophageal reflux disease with esophagitis, without bleeding: Secondary | ICD-10-CM

## 2019-01-13 DIAGNOSIS — Z125 Encounter for screening for malignant neoplasm of prostate: Secondary | ICD-10-CM | POA: Diagnosis not present

## 2019-01-13 DIAGNOSIS — Z1389 Encounter for screening for other disorder: Secondary | ICD-10-CM | POA: Diagnosis not present

## 2019-01-13 DIAGNOSIS — E559 Vitamin D deficiency, unspecified: Secondary | ICD-10-CM

## 2019-01-13 DIAGNOSIS — R7309 Other abnormal glucose: Secondary | ICD-10-CM

## 2019-01-13 DIAGNOSIS — Z1212 Encounter for screening for malignant neoplasm of rectum: Secondary | ICD-10-CM

## 2019-01-13 DIAGNOSIS — Z0001 Encounter for general adult medical examination with abnormal findings: Secondary | ICD-10-CM

## 2019-01-13 DIAGNOSIS — N138 Other obstructive and reflux uropathy: Secondary | ICD-10-CM

## 2019-01-13 DIAGNOSIS — R5383 Other fatigue: Secondary | ICD-10-CM

## 2019-01-13 DIAGNOSIS — Z1211 Encounter for screening for malignant neoplasm of colon: Secondary | ICD-10-CM

## 2019-01-13 DIAGNOSIS — R350 Frequency of micturition: Secondary | ICD-10-CM | POA: Diagnosis not present

## 2019-01-13 MED ORDER — FAMOTIDINE 20 MG PO TABS: ORAL_TABLET | ORAL | 3 refills | Status: DC

## 2019-01-14 LAB — CBC WITH DIFFERENTIAL/PLATELET
Absolute Monocytes: 718 cells/uL (ref 200–950)
Basophils Absolute: 62 cells/uL (ref 0–200)
Basophils Relative: 0.8 %
Eosinophils Absolute: 109 cells/uL (ref 15–500)
Eosinophils Relative: 1.4 %
HCT: 40.8 % (ref 38.5–50.0)
Hemoglobin: 14.1 g/dL (ref 13.2–17.1)
Lymphs Abs: 1841 cells/uL (ref 850–3900)
MCH: 31.3 pg (ref 27.0–33.0)
MCHC: 34.6 g/dL (ref 32.0–36.0)
MCV: 90.7 fL (ref 80.0–100.0)
MPV: 11.7 fL (ref 7.5–12.5)
Monocytes Relative: 9.2 %
Neutro Abs: 5070 cells/uL (ref 1500–7800)
Neutrophils Relative %: 65 %
Platelets: 207 10*3/uL (ref 140–400)
RBC: 4.5 10*6/uL (ref 4.20–5.80)
RDW: 11.8 % (ref 11.0–15.0)
Total Lymphocyte: 23.6 %
WBC: 7.8 10*3/uL (ref 3.8–10.8)

## 2019-01-14 LAB — TSH: TSH: 1.82 mIU/L (ref 0.40–4.50)

## 2019-01-14 LAB — URINALYSIS, ROUTINE W REFLEX MICROSCOPIC
Bilirubin Urine: NEGATIVE
Glucose, UA: NEGATIVE
Hgb urine dipstick: NEGATIVE
Leukocytes,Ua: NEGATIVE
Nitrite: NEGATIVE
Protein, ur: NEGATIVE
Specific Gravity, Urine: 1.026 (ref 1.001–1.03)
pH: 6.5 (ref 5.0–8.0)

## 2019-01-14 LAB — COMPLETE METABOLIC PANEL WITH GFR
AG Ratio: 1.9 (calc) (ref 1.0–2.5)
ALT: 19 U/L (ref 9–46)
AST: 16 U/L (ref 10–35)
Albumin: 4.6 g/dL (ref 3.6–5.1)
Alkaline phosphatase (APISO): 74 U/L (ref 35–144)
BUN: 21 mg/dL (ref 7–25)
CO2: 26 mmol/L (ref 20–32)
Calcium: 9.7 mg/dL (ref 8.6–10.3)
Chloride: 104 mmol/L (ref 98–110)
Creat: 1.15 mg/dL (ref 0.70–1.33)
GFR, Est African American: 83 mL/min/{1.73_m2} (ref >=60)
GFR, Est Non African American: 72 mL/min/{1.73_m2} (ref >=60)
Globulin: 2.4 g/dL (calc) (ref 1.9–3.7)
Glucose, Bld: 102 mg/dL — ABNORMAL HIGH (ref 65–99)
Potassium: 4.2 mmol/L (ref 3.5–5.3)
Sodium: 138 mmol/L (ref 135–146)
Total Bilirubin: 0.7 mg/dL (ref 0.2–1.2)
Total Protein: 7 g/dL (ref 6.1–8.1)

## 2019-01-14 LAB — PSA: PSA: 0.5 ng/mL (ref <=4.0)

## 2019-01-14 LAB — TESTOSTERONE: Testosterone: 198 ng/dL — ABNORMAL LOW (ref 250–827)

## 2019-01-14 LAB — INSULIN, RANDOM: Insulin: 82 u[IU]/mL — ABNORMAL HIGH

## 2019-01-14 LAB — VITAMIN B12: Vitamin B-12: 505 pg/mL (ref 200–1100)

## 2019-01-14 LAB — MAGNESIUM: Magnesium: 2 mg/dL (ref 1.5–2.5)

## 2019-01-14 LAB — MICROALBUMIN / CREATININE URINE RATIO
Creatinine, Urine: 229 mg/dL (ref 20–320)
Microalb Creat Ratio: 2 mcg/mg creat (ref <30)
Microalb, Ur: 0.4 mg/dL

## 2019-01-14 LAB — LIPID PANEL
Cholesterol: 166 mg/dL (ref <200)
HDL: 42 mg/dL (ref >=40)
LDL Cholesterol (Calc): 103 mg/dL (calc) — ABNORMAL HIGH
Non-HDL Cholesterol (Calc): 124 mg/dL (calc) (ref <130)
Total CHOL/HDL Ratio: 4 (calc) (ref <5.0)
Triglycerides: 117 mg/dL (ref <150)

## 2019-01-14 LAB — URIC ACID: Uric Acid, Serum: 5.2 mg/dL (ref 4.0–8.0)

## 2019-01-14 LAB — VITAMIN D 25 HYDROXY (VIT D DEFICIENCY, FRACTURES): Vit D, 25-Hydroxy: >150 ng/mL — ABNORMAL HIGH (ref 30–100)

## 2019-01-14 LAB — HEMOGLOBIN A1C
Hgb A1c MFr Bld: 5.6 % of total Hgb (ref <5.7)
Mean Plasma Glucose: 114 (calc)
eAG (mmol/L): 6.3 (calc)

## 2019-01-15 ENCOUNTER — Encounter: Payer: Self-pay | Admitting: Internal Medicine

## 2019-01-18 LAB — TB SKIN TEST
Induration: 0 mm
TB Skin Test: NEGATIVE

## 2019-01-19 ENCOUNTER — Other Ambulatory Visit: Payer: Self-pay | Admitting: Adult Health

## 2019-03-26 ENCOUNTER — Other Ambulatory Visit: Payer: Self-pay | Admitting: Internal Medicine

## 2019-04-15 ENCOUNTER — Ambulatory Visit: Payer: 59 | Admitting: Adult Health

## 2019-06-06 ENCOUNTER — Other Ambulatory Visit: Payer: Self-pay | Admitting: Internal Medicine

## 2019-07-16 ENCOUNTER — Ambulatory Visit: Payer: PRIVATE HEALTH INSURANCE | Attending: Internal Medicine

## 2019-07-16 DIAGNOSIS — Z23 Encounter for immunization: Secondary | ICD-10-CM

## 2019-07-16 NOTE — Progress Notes (Signed)
   Covid-19 Vaccination Clinic  Name:  Ryan Nguyen    MRN: [id number] DOB: 1965-10-02  07/16/2019  [name] was observed post Covid-19 immunization for 30 minutes based on pre-vaccination screening without incident. He was provided with Vaccine Information Sheet and instruction to access the V-Safe system.   [name] was instructed to call 911 with any severe reactions post vaccine: Marland Kitchen Difficulty breathing  . Swelling of face and throat  . A fast heartbeat  . A bad rash all over body  . Dizziness and weakness   Immunizations Administered    Name Date Dose VIS Date Route   Pfizer COVID-19 Vaccine 07/16/2019  4:20 PM 0.3 mL 04/16/2019 Intramuscular   Manufacturer: [place]   Lot: [id number]   [place]: [id number]

## 2019-07-19 ENCOUNTER — Ambulatory Visit: Payer: 59 | Admitting: Internal Medicine

## 2019-08-09 ENCOUNTER — Ambulatory Visit: Payer: PRIVATE HEALTH INSURANCE | Attending: Internal Medicine

## 2019-08-09 ENCOUNTER — Ambulatory Visit: Payer: PRIVATE HEALTH INSURANCE

## 2019-08-09 DIAGNOSIS — Z23 Encounter for immunization: Secondary | ICD-10-CM

## 2019-08-09 NOTE — Progress Notes (Signed)
   Covid-19 Vaccination Clinic  Name:  Ryan Nguyen    MRN: [id number] DOB: 06-28-1965  08/09/2019  [name] was observed post Covid-19 immunization for 30 minutes without incident. He was provided with Vaccine Information Sheet and instruction to access the V-Safe system.   [name] was instructed to call 911 with any severe reactions post vaccine: Marland Kitchen Difficulty breathing  . Swelling of face and throat  . A fast heartbeat  . A bad rash all over body  . Dizziness and weakness   Immunizations Administered    Name Date Dose VIS Date Route   Pfizer COVID-19 Vaccine 08/09/2019  5:02 PM 0.3 mL 04/16/2019 Intramuscular   Manufacturer: [place]   Lot: [id number]   [place]: [id number]

## 2019-09-14 ENCOUNTER — Other Ambulatory Visit: Payer: Self-pay | Admitting: Adult Health

## 2019-09-14 DIAGNOSIS — K589 Irritable bowel syndrome without diarrhea: Secondary | ICD-10-CM

## 2019-09-22 ENCOUNTER — Other Ambulatory Visit: Payer: Self-pay

## 2019-09-22 ENCOUNTER — Encounter: Payer: Self-pay | Admitting: Adult Health

## 2019-09-22 ENCOUNTER — Ambulatory Visit: Payer: 59 | Admitting: Adult Health

## 2019-09-22 VITALS — BP 138/88 | HR 100 | Temp 97.2°F | Ht 70.0 in | Wt 268.8 lb

## 2019-09-22 DIAGNOSIS — R7309 Other abnormal glucose: Secondary | ICD-10-CM | POA: Diagnosis not present

## 2019-09-22 DIAGNOSIS — E559 Vitamin D deficiency, unspecified: Secondary | ICD-10-CM | POA: Diagnosis not present

## 2019-09-22 DIAGNOSIS — I10 Essential (primary) hypertension: Secondary | ICD-10-CM | POA: Diagnosis not present

## 2019-09-22 DIAGNOSIS — E782 Mixed hyperlipidemia: Secondary | ICD-10-CM | POA: Diagnosis not present

## 2019-09-22 DIAGNOSIS — Z79899 Other long term (current) drug therapy: Secondary | ICD-10-CM | POA: Diagnosis not present

## 2019-09-22 NOTE — Patient Instructions (Addendum)
Goals    . Weight (lb) < 250 lb (113.4 kg)       Consider zinc 40-50 mg daily for testosterone and immunity    General tips for weight loss   Drink 1/2 your body weight in fluid ounces of water daily; drink a tall glass of water 30 min before meals  Don't eat until you're stuffed- listen to your stomach and eat until you are 80% full   Try eating off of a salad plate; wait 10 min after finishing before going back for seconds  Start by eating the vegetables on your plate; aim for 50% of your meals to be fruits or vegetables  Then eat your protein - lean meats (grass fed if possible), fish, beans, nuts in moderation. Beans are the healthiest source of protein.  Eat your carbs/starch last ONLY if you still are hungry. If you can, stop before finishing it all  Avoid sugar and flour (processed carbs) - the closer it looks to it's original form in nature, typically the better it is for you  Splurge in moderation  - I like to follow a 80% - 20% plan- "good" choices 80 % of the time, "bad" choices in moderation 20% of the time  Try to find "healthy" things that you really enjoy - try new recipes and get creative!    High-Fiber Diet Fiber, also called dietary fiber, is a type of carbohydrate that is found in fruits, vegetables, whole grains, and beans. A high-fiber diet can have many health benefits. Your health care provider may recommend a high-fiber diet to help:  Prevent constipation. Fiber can make your bowel movements more regular.  Lower your cholesterol.  Relieve the following conditions: ? Swelling of veins in the anus (hemorrhoids). ? Swelling and irritation (inflammation) of specific areas of the digestive tract (uncomplicated diverticulosis). ? A problem of the large intestine (colon) that sometimes causes pain and diarrhea (irritable bowel syndrome, IBS).  Prevent overeating as part of a weight-loss plan.  Prevent heart disease, type 2 diabetes, and certain  cancers. What is my plan? The recommended daily fiber intake in grams (g) includes:  38 g for men age 50 or younger.  30 g for men over age 29.  56 g for women age 2 or younger.  21 g for women over age 4. You can get the recommended daily intake of dietary fiber by:  Eating a variety of fruits, vegetables, grains, and beans.  Taking a fiber supplement, if it is not possible to get enough fiber through your diet. What do I need to know about a high-fiber diet?  It is better to get fiber through food sources rather than from fiber supplements. There is not a lot of research about how effective supplements are.  Always check the fiber content on the nutrition facts label of any prepackaged food. Look for foods that contain 5 g of fiber or more per serving.  Talk with a diet and nutrition specialist (dietitian) if you have questions about specific foods that are recommended or not recommended for your medical condition, especially if those foods are not listed below.  Gradually increase how much fiber you consume. If you increase your intake of dietary fiber too quickly, you may have bloating, cramping, or gas.  Drink plenty of water. Water helps you to digest fiber. What are tips for following this plan?  Eat a wide variety of high-fiber foods.  Make sure that half of the grains that you eat each  day are whole grains.  Eat breads and cereals that are made with whole-grain flour instead of refined flour or white flour.  Eat brown rice, bulgur wheat, or millet instead of white rice.  Start the day with a breakfast that is high in fiber, such as a cereal that contains 5 g of fiber or more per serving.  Use beans in place of meat in soups, salads, and pasta dishes.  Eat high-fiber snacks, such as berries, raw vegetables, nuts, and popcorn.  Choose whole fruits and vegetables instead of processed forms like juice or sauce. What foods can I eat?  Fruits Berries. Pears.  Apples. Oranges. Avocado. Prunes and raisins. Dried figs. Vegetables Sweet potatoes. Spinach. Kale. Artichokes. Cabbage. Broccoli. Cauliflower. Green peas. Carrots. Squash. Grains Whole-grain breads. Multigrain cereal. Oats and oatmeal. Brown rice. Barley. Bulgur wheat. [place]. Quinoa. Bran muffins. Popcorn. Rye wafer crackers. Meats and other proteins Navy, kidney, and pinto beans. Soybeans. Split peas. Lentils. Nuts and seeds. Dairy Fiber-fortified yogurt. Beverages Fiber-fortified soy milk. Fiber-fortified orange juice. Other foods Fiber bars. The items listed above may not be a complete list of recommended foods and beverages. Contact a dietitian for more options. What foods are not recommended? Fruits Fruit juice. Cooked, strained fruit. Vegetables Fried potatoes. Canned vegetables. Well-cooked vegetables. Grains White bread. Pasta made with refined flour. White rice. Meats and other proteins Fatty cuts of meat. Fried chicken or fried fish. Dairy Milk. Yogurt. Cream cheese. Sour cream. Fats and oils Butters. Beverages Soft drinks. Other foods Cakes and pastries. The items listed above may not be a complete list of foods and beverages to avoid. Contact a dietitian for more information. Summary  Fiber is a type of carbohydrate. It is found in fruits, vegetables, whole grains, and beans.  There are many health benefits of eating a high-fiber diet, such as preventing constipation, lowering blood cholesterol, helping with weight loss, and reducing your risk of heart disease, diabetes, and certain cancers.  Gradually increase your intake of fiber. Increasing too fast can result in cramping, bloating, and gas. Drink plenty of water while you increase your fiber.  The best sources of fiber include whole fruits and vegetables, whole grains, nuts, seeds, and beans. This information is not intended to replace advice given to you by your health care provider. Make sure you discuss  any questions you have with your health care provider. Document Revised: 02/24/2017 Document Reviewed: 02/24/2017 Elsevier Patient Education  2020 [employer].

## 2019-09-22 NOTE — Progress Notes (Signed)
FOLLOW UP  Assessment and Plan:   Hypertension Well controlled with current medications  Monitor blood pressure at home; patient to call if consistently greater than 130/80 Continue DASH diet.   Reminder to go to the ER if any CP, SOB, nausea, dizziness, severe HA, changes vision/speech, left arm numbness and tingling and jaw pain.  Cholesterol Currently at goal; continue atorvastatin Continue low cholesterol diet and exercise.  Check lipid panel.   Hx of prediabetes A1Cs significantly improved with weight loss Continue diet and exercise.  Perform daily foot/skin check, notify office of any concerning changes.  Check A1C q86m, then annually if continues to remain well controlled  Morbid obesity - BMI 38 with prediabetes, htn, hyperlipidemia Long discussion about weight loss, diet, and exercise Discussed final goal weight and current weight loss goal (250 lb) Patient has been on phentermine with benefit and no SE- restart  Vitamin D Def Above goal at last visit; has reduced from 50000 daily to every other day  continue supplementation to maintain goal of 70-100 Check Vit D level  Testosterone deficiency Declines supplement; encouraged weight loss, suggested zinc 50 mg supplement   Continue diet and meds as discussed. Further disposition pending results of labs. Discussed med's effects and SE's.   Over 30 minutes of exam, counseling, chart review, and critical decision making was performed.   Future Appointments  Date Time Provider [place]  02/14/2020  3:00 PM Unk Pinto, MD [facility] None    ----------------------------------------------------------------------------------------------------------------------  HPI 54 y.o. male  presents for 3 month follow up on hypertension, cholesterol, hx of prediabetes, morbid obesity and vitamin D deficiency.   BMI is Body mass index is 38.57 kg/m., he is working on diet and exercise. He is prescribed phentermine  but states that he is not taking it in several months. He has been trying to make better diet choices, walking daily, watching snacking.  He has lost 0 lb. He states knows what he needs to do, plans to restart.  Wt Readings from Last 3 Encounters:  09/22/19 268 lb 12.8 oz (121.9 kg)  01/13/19 256 lb 3.2 oz (116.2 kg)  04/22/18 243 lb (110.2 kg)   His blood pressure has been controlled at home (110-130s/80s), today their BP is BP: 138/88  He does not workout but works a physically active job. He denies chest pain, shortness of breath, dizziness.   He is on cholesterol medication (atorvastatin 20 mg daily) and denies myalgias. His cholesterol is not at goal. The cholesterol last visit was:  Lab Results  Component Value Date   CHOL 166 01/13/2019   HDL 42 01/13/2019   LDLCALC 103 (H) 01/13/2019   TRIG 117 01/13/2019   CHOLHDL 4.0 01/13/2019    He has been working on diet and exercise for hx of prediabetes, and denies foot ulcerations, increased appetite, nausea, paresthesia of the feet, polydipsia, polyuria, visual disturbances, vomiting and weight loss. Last A1C in the office was:  Lab Results  Component Value Date   HGBA1C 5.6 01/13/2019    Last GFR:  Lab Results  Component Value Date   GFRNONAA 72 01/13/2019   Patient is on Vitamin D supplement and above goal at recent check, has cut back from 50000 IU daily to every other day:    Lab Results  Component Value Date   VD25OH >150 (H) 01/13/2019     He has testosterone deficiency but no sympotms and declined supplement. Suggested zinc.  Prescribed sildenafil for ED  Lab Results  Component Value  Date   TESTOSTERONE 198 (L) 01/13/2019     Current Medications:  Current Outpatient Medications on File Prior to Visit  Medication Sig  . atorvastatin (LIPITOR) 20 MG tablet TAKE 1 TABLET BY MOUTH EVERY DAY  . Cinnamon 500 MG TABS Take 1 tablet by mouth daily.   . citalopram (CELEXA) 40 MG tablet Take 1 tablet Daily for Mood  .  dicyclomine (BENTYL) 20 MG tablet TAKE 1 TABLET BY MOUTH 3 TIMES A DAY AS NEEDED FOR CRAMPING, BLOATING, OR NAUSEA  . famotidine (PEPCID) 20 MG tablet Take 1 tablet 2 x /day as need for Indigestion & Heartburn.  Marland Kitchen lisinopril (ZESTRIL) 10 MG tablet Take 1 tablet Daily for BP  . Multiple Vitamins-Minerals (MULTIVITAMIN WITH MINERALS) tablet Take 1 tablet by mouth daily.  . phentermine (ADIPEX-P) 37.5 MG tablet Take 1 tablet every morning for Dieting & Weight Loss  . sildenafil (VIAGRA) 100 MG tablet Take 1/2 to 1 tablet Daily as needed for XXXX  . Vitamin D, Ergocalciferol, (DRISDOL) 1.25 MG (50000 UT) CAPS capsule TAKE 1 CAPSULE BY MOUTH ONCE DAILY OR AS DIRECTED   No current facility-administered medications on file prior to visit.     Allergies:  Allergies  Allergen Reactions  . Bee Venom Anaphylaxis  . Chantix [Varenicline]      Medical History:  Past Medical History:  Diagnosis Date  . Anxiety   . Chest pain   . Elevated hemoglobin A1c   . Hyperlipidemia   . Hypertension   . Hypogonadism male   . Obese   . PND (post-nasal drip)   . Prediabetes   . SOB (shortness of breath) on exertion   . Vitamin D deficiency    Family history- Reviewed and unchanged Social history- Reviewed and unchanged   Review of Systems:  Review of Systems  Constitutional: Negative for malaise/fatigue and weight loss.  HENT: Negative for hearing loss and tinnitus.   Eyes: Negative for blurred vision and double vision.  Respiratory: Negative for cough, shortness of breath and wheezing.   Cardiovascular: Negative for chest pain, palpitations, orthopnea, claudication and leg swelling.  Gastrointestinal: Negative for abdominal pain, blood in stool, constipation, diarrhea, heartburn, melena, nausea and vomiting.  Genitourinary: Negative.   Musculoskeletal: Negative for back pain, falls, joint pain, myalgias and neck pain.  Skin: Negative for rash.  Neurological: Negative for dizziness, tingling,  sensory change, weakness and headaches.  Endo/Heme/Allergies: Negative for polydipsia.  Psychiatric/Behavioral: Negative.   All other systems reviewed and are negative.     Physical Exam: BP 138/88   Pulse 100   Temp (!) 97.2 F (36.2 C)   Ht 5\' 10"  (1.778 m)   Wt 268 lb 12.8 oz (121.9 kg)   SpO2 98%   BMI 38.57 kg/m  Wt Readings from Last 3 Encounters:  09/22/19 268 lb 12.8 oz (121.9 kg)  01/13/19 256 lb 3.2 oz (116.2 kg)  04/22/18 243 lb (110.2 kg)   General Appearance: Well nourished, in no apparent distress. Eyes: PERRLA, EOMs, conjunctiva no swelling or erythema Sinuses: No Frontal/maxillary tenderness ENT/Mouth: Ext aud canals clear, TMs without erythema, bulging. No erythema, swelling, or exudate on post pharynx.  Tonsils not swollen or erythematous. Hearing normal.  Neck: Supple, thyroid normal.  Respiratory: Respiratory effort normal, BS equal bilaterally without rales, rhonchi, wheezing or stridor.  Cardio: RRR with no MRGs. Brisk peripheral pulses without edema.  Abdomen: Soft, + BS.  Non tender, no guarding, rebound, hernias, masses. Lymphatics: Non tender without lymphadenopathy.  Musculoskeletal:  Full ROM, 5/5 strength, Normal gait, Shoulder right: Full range of motion. Neurovascularly intact distally. Good strength with stress of rotator cuff but causes pain. Positive impingement signs. Skin: Warm, dry without rashes, lesions, ecchymosis.  Neuro: Cranial nerves intact. No cerebellar symptoms.  Psych: Awake and oriented X 3, normal affect, Insight and Judgment appropriate.    Izora Ribas, NP 4:39 PM [facility]

## 2019-09-23 ENCOUNTER — Other Ambulatory Visit: Payer: Self-pay | Admitting: Adult Health

## 2019-09-23 LAB — HEMOGLOBIN A1C
Hgb A1c MFr Bld: 5.4 % of total Hgb (ref <5.7)
Mean Plasma Glucose: 108 (calc)
eAG (mmol/L): 6 (calc)

## 2019-09-23 LAB — COMPLETE METABOLIC PANEL WITH GFR
AG Ratio: 1.8 (calc) (ref 1.0–2.5)
ALT: 21 U/L (ref 9–46)
AST: 15 U/L (ref 10–35)
Albumin: 4.7 g/dL (ref 3.6–5.1)
Alkaline phosphatase (APISO): 72 U/L (ref 35–144)
BUN: 18 mg/dL (ref 7–25)
CO2: 31 mmol/L (ref 20–32)
Calcium: 9.6 mg/dL (ref 8.6–10.3)
Chloride: 104 mmol/L (ref 98–110)
Creat: 0.97 mg/dL (ref 0.70–1.33)
GFR, Est African American: 101 mL/min/{1.73_m2} (ref >=60)
GFR, Est Non African American: 88 mL/min/{1.73_m2} (ref >=60)
Globulin: 2.6 g/dL (calc) (ref 1.9–3.7)
Glucose, Bld: 95 mg/dL (ref 65–99)
Potassium: 4.3 mmol/L (ref 3.5–5.3)
Sodium: 139 mmol/L (ref 135–146)
Total Bilirubin: 0.5 mg/dL (ref 0.2–1.2)
Total Protein: 7.3 g/dL (ref 6.1–8.1)

## 2019-09-23 LAB — CBC WITH DIFFERENTIAL/PLATELET
Absolute Monocytes: 943 cells/uL (ref 200–950)
Basophils Absolute: 85 cells/uL (ref 0–200)
Basophils Relative: 0.8 %
Eosinophils Absolute: 286 cells/uL (ref 15–500)
Eosinophils Relative: 2.7 %
HCT: 42.2 % (ref 38.5–50.0)
Hemoglobin: 14.4 g/dL (ref 13.2–17.1)
Lymphs Abs: 2194 cells/uL (ref 850–3900)
MCH: 31.2 pg (ref 27.0–33.0)
MCHC: 34.1 g/dL (ref 32.0–36.0)
MCV: 91.5 fL (ref 80.0–100.0)
MPV: 11.9 fL (ref 7.5–12.5)
Monocytes Relative: 8.9 %
Neutro Abs: 7091 cells/uL (ref 1500–7800)
Neutrophils Relative %: 66.9 %
Platelets: 220 10*3/uL (ref 140–400)
RBC: 4.61 10*6/uL (ref 4.20–5.80)
RDW: 11.8 % (ref 11.0–15.0)
Total Lymphocyte: 20.7 %
WBC: 10.6 10*3/uL (ref 3.8–10.8)

## 2019-09-23 LAB — LIPID PANEL
Cholesterol: 153 mg/dL (ref <200)
HDL: 40 mg/dL (ref >=40)
LDL Cholesterol (Calc): 89 mg/dL (calc)
Non-HDL Cholesterol (Calc): 113 mg/dL (calc) (ref <130)
Total CHOL/HDL Ratio: 3.8 (calc) (ref <5.0)
Triglycerides: 143 mg/dL (ref <150)

## 2019-09-23 LAB — MAGNESIUM: Magnesium: 2.1 mg/dL (ref 1.5–2.5)

## 2019-09-23 LAB — VITAMIN D 25 HYDROXY (VIT D DEFICIENCY, FRACTURES): Vit D, 25-Hydroxy: 131 ng/mL — ABNORMAL HIGH (ref 30–100)

## 2019-09-23 LAB — TSH: TSH: 2.15 mIU/L (ref 0.40–4.50)

## 2019-09-23 MED ORDER — VITAMIN D (ERGOCALCIFEROL) 1.25 MG (50000 UNIT) PO CAPS: 50000.0000 [IU] | ORAL_CAPSULE | ORAL | 3 refills | Status: DC

## 2019-10-05 ENCOUNTER — Encounter: Payer: Self-pay | Admitting: Gastroenterology

## 2019-10-07 ENCOUNTER — Other Ambulatory Visit: Payer: Self-pay | Admitting: Adult Health

## 2019-10-07 DIAGNOSIS — K589 Irritable bowel syndrome without diarrhea: Secondary | ICD-10-CM

## 2019-11-04 ENCOUNTER — Other Ambulatory Visit: Payer: Self-pay | Admitting: Internal Medicine

## 2019-11-04 ENCOUNTER — Other Ambulatory Visit: Payer: Self-pay | Admitting: Adult Health

## 2019-11-08 ENCOUNTER — Other Ambulatory Visit: Payer: Self-pay | Admitting: Internal Medicine

## 2019-11-19 ENCOUNTER — Encounter: Payer: Self-pay | Admitting: Gastroenterology

## 2019-11-19 ENCOUNTER — Ambulatory Visit (AMBULATORY_SURGERY_CENTER): Payer: Self-pay | Admitting: *Deleted

## 2019-11-19 ENCOUNTER — Other Ambulatory Visit: Payer: Self-pay

## 2019-11-19 VITALS — Ht 70.0 in | Wt 250.0 lb

## 2019-11-19 DIAGNOSIS — Z86010 Personal history of colonic polyps: Secondary | ICD-10-CM

## 2019-11-19 NOTE — Progress Notes (Signed)

## 2019-11-29 ENCOUNTER — Encounter: Payer: Self-pay | Admitting: Gastroenterology

## 2019-11-29 ENCOUNTER — Other Ambulatory Visit: Payer: Self-pay

## 2019-11-29 ENCOUNTER — Ambulatory Visit (AMBULATORY_SURGERY_CENTER): Payer: 59 | Admitting: Gastroenterology

## 2019-11-29 VITALS — BP 124/85 | HR 64 | Temp 97.3°F | Resp 16 | Ht 70.0 in | Wt 250.0 lb

## 2019-11-29 DIAGNOSIS — Z86010 Personal history of colonic polyps: Secondary | ICD-10-CM | POA: Diagnosis present

## 2019-11-29 DIAGNOSIS — D122 Benign neoplasm of ascending colon: Secondary | ICD-10-CM

## 2019-11-29 DIAGNOSIS — D120 Benign neoplasm of cecum: Secondary | ICD-10-CM

## 2019-11-29 DIAGNOSIS — K635 Polyp of colon: Secondary | ICD-10-CM

## 2019-11-29 DIAGNOSIS — D123 Benign neoplasm of transverse colon: Secondary | ICD-10-CM

## 2019-11-29 MED ORDER — SODIUM CHLORIDE 0.9 % IV SOLN: 500.0000 mL | Freq: Once | INTRAVENOUS | Status: DC

## 2019-11-29 NOTE — Progress Notes (Signed)
To PACU, VSS. Report to [email or web address] 

## 2019-11-29 NOTE — Progress Notes (Signed)
Called to room to assist during endoscopic procedure.  Patient ID and intended procedure confirmed with present staff. Received instructions for my participation in the procedure from the performing physician.  

## 2019-11-29 NOTE — Progress Notes (Signed)
VS by CW  Pt's states no medical or surgical changes since previsit or office visit.  

## 2019-11-29 NOTE — Op Note (Signed)
[facility] Patient Name: Ryan Nguyen Procedure Date: 11/29/2019 9:01 AM MRN: [id number] Endoscopist: Mallie Mussel L. Loletha Carrow , MD Age: 54 Referring MD:  Date of Birth: 1966/02/07 Gender: Male Account #: [id number] Procedure:                Colonoscopy Indications:              Surveillance: Personal history of adenomatous                            polyps on last colonoscopy 5 years ago (diminutive                            cecal TA 10/2014) Medicines:                Monitored Anesthesia Care Procedure:                Pre-Anesthesia Assessment:                           - Prior to the procedure, a History and Physical                            was performed, and patient medications and                            allergies were reviewed. The patient's tolerance of                            previous anesthesia was also reviewed. The risks                            and benefits of the procedure and the sedation                            options and risks were discussed with the patient.                            All questions were answered, and informed consent                            was obtained. Prior Anticoagulants: The patient has                            taken no previous anticoagulant or antiplatelet                            agents. ASA Grade Assessment: III - A patient with                            severe systemic disease. After reviewing the risks                            and benefits, the patient was deemed in  satisfactory condition to undergo the procedure.                           After obtaining informed consent, the colonoscope                            was passed under direct vision. Throughout the                            procedure, the patient's blood pressure, pulse, and                            oxygen saturations were monitored continuously. The                            Colonoscope was introduced through the  anus and                            advanced to the the cecum, identified by                            appendiceal orifice and ileocecal valve. The                            colonoscopy was performed without difficulty. The                            patient tolerated the procedure well. The quality                            of the bowel preparation was good after lavage. The                            ileocecal valve, appendiceal orifice, and rectum                            were photographed. The bowel preparation used was                            Miralax. Scope In: 9:10:47 AM Scope Out: 9:33:51 AM Scope Withdrawal Time: 0 hours 16 minutes 15 seconds  Total Procedure Duration: 0 hours 23 minutes 4 seconds  Findings:                 The perianal and digital rectal examinations were                            normal.                           Many diverticula were found in the left colon.                           A 8 mm polyp was found in the cecum. The polyp was  sessile. The polyp was removed with a cold snare.                            Resection and retrieval were complete.                           Three sessile polyps were found in the proximal                            transverse colon and ascending colon. The polyps                            were diminutive in size. These polyps were removed                            with a cold snare. Resection and retrieval were                            complete.                           Internal hemorrhoids were found.                           The exam was otherwise without abnormality on                            direct and retroflexion views. Complications:            No immediate complications. Estimated Blood Loss:     Estimated blood loss was minimal. Impression:               - Diverticulosis in the left colon.                           - One 8 mm polyp in the cecum, removed with a cold                             snare. Resected and retrieved.                           - Three diminutive polyps in the proximal                            transverse colon and in the ascending colon,                            removed with a cold snare. Resected and retrieved.                           - Internal hemorrhoids.                           - The examination was otherwise normal on direct  and retroflexion views. Recommendation:           - Patient has a contact number available for                            emergencies. The signs and symptoms of potential                            delayed complications were discussed with the                            patient. Return to normal activities tomorrow.                            Written discharge instructions were provided to the                            patient.                           - Resume previous diet.                           - Continue present medications.                           - Await pathology results.                           - Repeat colonoscopy in 3 years for surveillance.                            (Suprep or Plenvu for next exam) Jahmier Willadsen L. Loletha Carrow, MD 11/29/2019 9:39:21 AM This report has been signed electronically.

## 2019-11-29 NOTE — Patient Instructions (Signed)
Handouts given for polyps, hemorrhoids, diverticulosis and high fiber diet.  YOU HAD AN ENDOSCOPIC PROCEDURE TODAY AT THE [facility]:   Refer to the procedure report that was given to you for any specific questions about what was found during the examination.  If the procedure report does not answer your questions, please call your gastroenterologist to clarify.  If you requested that your care partner not be given the details of your procedure findings, then the procedure report has been included in a sealed envelope for you to review at your convenience later.  YOU SHOULD EXPECT: Some feelings of bloating in the abdomen. Passage of more gas than usual.  Walking can help get rid of the air that was put into your GI tract during the procedure and reduce the bloating. If you had a lower endoscopy (such as a colonoscopy or flexible sigmoidoscopy) you may notice spotting of blood in your stool or on the toilet paper. If you underwent a bowel prep for your procedure, you may not have a normal bowel movement for a few days.  Please Note:  You might notice some irritation and congestion in your nose or some drainage.  This is from the oxygen used during your procedure.  There is no need for concern and it should clear up in a day or so.  SYMPTOMS TO REPORT IMMEDIATELY:   Following lower endoscopy (colonoscopy or flexible sigmoidoscopy):  Excessive amounts of blood in the stool  Significant tenderness or worsening of abdominal pains  Swelling of the abdomen that is new, acute  Fever of 100F or higher  For urgent or emergent issues, a gastroenterologist can be reached at any hour by calling [phone]. Do not use MyChart messaging for urgent concerns.    DIET:  We do recommend a small meal at first, but then you may proceed to your regular diet.  Drink plenty of fluids but you should avoid alcoholic beverages for 24 hours.  ACTIVITY:  You should plan to take it easy for the rest of  today and you should NOT DRIVE or use heavy machinery until tomorrow (because of the sedation medicines used during the test).    FOLLOW UP: Our staff will call the number listed on your records 48-72 hours following your procedure to check on you and address any questions or concerns that you may have regarding the information given to you following your procedure. If we do not reach you, we will leave a message.  We will attempt to reach you two times.  During this call, we will ask if you have developed any symptoms of COVID 19. If you develop any symptoms (ie: fever, flu-like symptoms, shortness of breath, cough etc.) before then, please call [phone].  If you test positive for Covid 19 in the 2 weeks post procedure, please call and report this information to us.    If any biopsies were taken you will be contacted by phone or by letter within the next 1-3 weeks.  Please call us at [phone] if you have not heard about the biopsies in 3 weeks.    SIGNATURES/CONFIDENTIALITY: You and/or your care partner have signed paperwork which will be entered into your electronic medical record.  These signatures attest to the fact that that the information above on your After Visit Summary has been reviewed and is understood.  Full responsibility of the confidentiality of this discharge information lies with you and/or your care-partner. 

## 2019-12-01 ENCOUNTER — Telehealth: Payer: Self-pay | Admitting: *Deleted

## 2019-12-01 ENCOUNTER — Encounter: Payer: Self-pay | Admitting: Gastroenterology

## 2019-12-01 NOTE — Telephone Encounter (Signed)
  Follow up Call-  Call back number 11/29/2019  Post procedure Call Back phone  # [phone]  Permission to leave phone message Yes  Some recent data might be hidden     Patient questions:  Do you have a fever, pain , or abdominal swelling? No. Pain Score  0 *  Have you tolerated food without any problems? Yes.    Have you been able to return to your normal activities? Yes.    Do you have any questions about your discharge instructions: Diet   No. Medications  No. Follow up visit  No.  Do you have questions or concerns about your Care? No.  Actions: * If pain score is 4 or above: 1. No action needed, pain <4.Have you developed a fever since your procedure? no  2.   Have you had an respiratory symptoms (SOB or cough) since your procedure? no  3.   Have you tested positive for COVID 19 since your procedure no  4.   Have you had any family members/close contacts diagnosed with the COVID 19 since your procedure?  no   If yes to any of these questions please route to Joylene John, RN and Erenest Rasher, RN

## 2020-01-21 ENCOUNTER — Other Ambulatory Visit: Payer: Self-pay | Admitting: Adult Health Nurse Practitioner

## 2020-01-21 ENCOUNTER — Other Ambulatory Visit: Payer: Self-pay | Admitting: Internal Medicine

## 2020-01-21 DIAGNOSIS — K2100 Gastro-esophageal reflux disease with esophagitis, without bleeding: Secondary | ICD-10-CM

## 2020-02-13 ENCOUNTER — Encounter: Payer: Self-pay | Admitting: Internal Medicine

## 2020-02-13 NOTE — Progress Notes (Signed)
Annual  Screening/Preventative Visit  & Comprehensive Evaluation & Examination     This very nice 54 y.o.   MWM  presents for a Screening /Preventative Visit & comprehensive evaluation and management of multiple medical co-morbidities.  Patient has been followed for HTN, HLD, T2_NIDDM  and Vitamin D Deficiency.     HTN predates circa 1999. Patient's BP has been controlled at home.  Today's BP is at goal -  124/84. Patient denies any cardiac symptoms as chest pain, palpitations, shortness of breath, dizziness or ankle swelling.     Patient's hyperlipidemia is controlled with diet and Atorvastatin. Patient denies myalgias or other medication SE's. Last lipids were at goal:  Lab Results  Component Value Date   CHOL 153 09/22/2019   HDL 40 09/22/2019   LDLCALC 89 09/22/2019   TRIG 143 09/22/2019   CHOLHDL 3.8 09/22/2019       Patient has Morbid Obesity (BMI 38.5+)  & T2_NIDDM  (A1c 5.9% /1999, then A1c 6.4% /2016 and 6.6% /2017) which he's attempting control with diet. In 2018, patient lost 50# from 290# to down to 240#.   Patient denies reactive hypoglycemic symptoms, visual blurring, diabetic polys or paresthesias. Last A1c was Normal & at goal:  Lab Results  Component Value Date   HGBA1C 5.4 09/22/2019        Patient has hx/o low Testosterone & has deferred replacement therapy.       Finally, patient has history of Vitamin D Deficiency ("16" /2008)and last vitamin D was elevated and dose was stopped:  Lab Results  Component Value Date   VD25OH 131 (H) 09/22/2019    Current Outpatient Medications on File Prior to Visit  Medication Sig  . atorvastatin (LIPITOR) 20 MG tablet TAKE 1 TABLET EVERY DAY  . Cinnamon 500 MG TABS Take 1 tablet  daily.   . citalopram (CELEXA) 40 MG tablet Take 1 tablet Daily for Mood  . dicyclomine (BENTYL) 20 MG tablet Take 1 tablet 3 x /day as needed     . famotidine  20 MG tablet TAKE 1 TABLET 2 X /DAY AS NEEDED  . ibuprofen 600 MG tablet Take  600 mg by mouth 3 (three) times daily.  Marland Kitchen lisinopril 10 MG tablet TAKE 1 TABLET DAILY FOR BLOOD PRESSURE  . Multiple Vitamins-Minerals  Take 1 tablet by mouth daily.  . phentermine 37.5 MG tablet Take 1 tablet every morning for Dieting & Weight Loss  . sildenafil (VIAGRA) 100 MG tablet Take 1/2 to 1 tablet Daily as needed for XXXX  . Vitamin D 50,000 UNIT CAPS  not taking: Reported on 02/14/2020)    Allergies  Allergen Reactions  . Bee Venom Anaphylaxis  . Chantix [Varenicline]    Past Medical History:  Diagnosis Date  . Allergy   . Anxiety   . Chest pain   . Elevated hemoglobin A1c   . GERD (gastroesophageal reflux disease)   . Hyperlipidemia   . Hypertension   . Hypogonadism male   . Obese   . PND (post-nasal drip)   . Prediabetes   . SOB (shortness of breath) on exertion   . Vitamin D deficiency    Health Maintenance  Topic Date Due  . Hepatitis C Screening  Never done  . INFLUENZA VACCINE  12/05/2019  . TETANUS/TDAP  05/05/2022  . COLONOSCOPY  11/29/2022  . COVID-19 Vaccine  Completed  . HIV Screening  Discontinued   Immunization History  Administered Date(s) Administered  . Influenza Inj Mdck Quad  Pf 02/22/2019  . Influenza Inj Mdck Quad With Preservative 02/28/2017  . Influenza Split 02/01/2013, 03/11/2014  . Influenza-Unspecified 12/08/2017  . PFIZER SARS-COV-2 Vaccination 07/16/2019, 08/09/2019  . PPD Test 11/18/2013, 07/18/2014, 07/20/2015, 09/05/2016, 12/10/2017, 01/13/2019  . Pneumococcal Polysaccharide-23 11/05/2012  . Pneumococcal-Unspecified 05/06/1998  . Td 05/06/2001  . Tdap 05/05/2012  . Zoster Recombinat (Shingrix) 09/27/2017, 12/30/2017   Last Colon - 10/19/2014 Tubular adenoma - Dr Deatra Ina and recc 5 year f/u             11/29/2019 - Dr Loletha Carrow - Recc 3 year f/u Colon   Past Surgical History:  Procedure Laterality Date  . COLONOSCOPY    . LEFT HEART CATHETERIZATION WITH CORONARY ANGIOGRAM N/A 02/19/2013   Procedure: LEFT HEART  CATHETERIZATION WITH CORONARY ANGIOGRAM;  Surgeon: Josue Hector, MD;  Location: [facility] CATH LAB;  Service: Cardiovascular;  Laterality: N/A;  . nasal ostea    . POLYPECTOMY    . Skin cancer excision  2004   Family History  Problem Relation Age of Onset  . Hypertension Mother   . COPD Mother   . Diabetes Sister   . Cancer Father        Lung cancer  . Colon cancer Neg Hx   . Esophageal cancer Neg Hx   . Rectal cancer Neg Hx   . Stomach cancer Neg Hx    Social History   Socioeconomic History  . Marital status: Married    Spouse name: Amy  . Number of children: 2  Occupational History  . Psychologist, counselling at [employer]  . Smoking status: Former Smoker    Quit date: 05/01/2012    Years since quitting: 7.7  . Smokeless tobacco: Never Used  Substance and Sexual Activity  . Alcohol use: Yes    Alcohol/week: 0.0 standard drinks    Comment: very little  . Drug use: No  . Sexual activity: Not on file    ROS Constitutional: Denies fever, chills, weight loss/gain, headaches, insomnia,  night sweats or change in appetite. Does c/o fatigue. Eyes: Denies redness, blurred vision, diplopia, discharge, itchy or watery eyes.  ENT: Denies discharge, congestion, post nasal drip, epistaxis, sore throat, earache, hearing loss, dental pain, Tinnitus, Vertigo, Sinus pain or snoring.  Cardio: Denies chest pain, palpitations, irregular heartbeat, syncope, dyspnea, diaphoresis, orthopnea, PND, claudication or edema Respiratory: denies cough, dyspnea, DOE, pleurisy, hoarseness, laryngitis or wheezing.  Gastrointestinal: Denies dysphagia, heartburn, reflux, water brash, pain, cramps, nausea, vomiting, bloating, diarrhea, constipation, hematemesis, melena, hematochezia, jaundice or hemorrhoids Genitourinary: Denies dysuria, discharge, hematuria or flank pain . Has urgency, nocturia x 1-3 & occasional hesitancy. Musculoskeletal: Denies arthralgia, myalgia, stiffness, Jt. Swelling, pain,  limp or strain/sprain. Denies Falls. Skin: Denies puritis, rash, hives, warts, acne, eczema or change in skin lesion Neuro: No weakness, tremor, incoordination, spasms, paresthesia or pain Psychiatric: Denies confusion, memory loss or sensory loss. Denies Depression. Endocrine: Denies change in weight, skin, hair change, nocturia, and paresthesia, diabetic polys, visual blurring or hyper / hypo glycemic episodes.  Heme/Lymph: No excessive bleeding, bruising or enlarged lymph nodes.  Physical Exam  BP 124/84   Pulse 87   Temp (!) 97.4 F (36.3 C)   Resp 16   Ht 5\' 10"  (1.778 m)   Wt 269 lb 6.4 oz (122.2 kg)   BMI 38.65 kg/m   General Appearance:  Over nourished and well groomed and in no apparent distress.  Eyes: PERRLA, EOMs, conjunctiva no swelling or erythema, normal fundi and  vessels. Sinuses: No frontal/maxillary tenderness ENT/Mouth: EACs patent / TMs  nl. Nares clear without erythema, swelling, mucoid exudates. Oral hygiene is good. No erythema, swelling, or exudate. Tongue normal, non-obstructing. Tonsils not swollen or erythematous. Hearing normal.  Neck: Supple, thyroid not palpable. No bruits, nodes or JVD. Respiratory: Respiratory effort normal.  BS equal and clear bilateral without rales, rhonci, wheezing or stridor. Cardio: Heart sounds are normal with regular rate and rhythm and no murmurs, rubs or gallops. Peripheral pulses are normal and equal bilaterally without edema. No aortic or femoral bruits. Chest: symmetric with normal excursions and percussion.  Abdomen: Soft, with Nl bowel sounds. Nontender, no guarding, rebound, hernias, masses, or organomegaly.  Lymphatics: Non tender without lymphadenopathy.  Musculoskeletal: Full ROM all peripheral extremities, joint stability, 5/5 strength, and normal gait. Skin: Warm and dry without rashes, lesions, cyanosis, clubbing or  ecchymosis.  Neuro: Cranial nerves intact, reflexes equal bilaterally. Normal muscle tone, no  cerebellar symptoms. Sensation intact.  Pysch: Alert and oriented X 3 with normal affect, insight and judgment appropriate.   Assessment and Plan  1. Annual Preventative/Screening Exam         2. Essential hypertension  - EKG 12-Lead - [place], RETROPERITNL ABD,  LTD - Urinalysis, Routine w reflex microscopic - Microalbumin / creatinine urine ratio - CBC with Differential/Platelet - COMPLETE METABOLIC PANEL WITH GFR - Magnesium - TSH  3. Hyperlipidemia, mixed  - EKG 12-Lead - [place], RETROPERITNL ABD,  LTD - Lipid panel - TSH  4. Abnormal glucose  - EKG 12-Lead - [place], RETROPERITNL ABD,  LTD - Hemoglobin A1c - Insulin, random  5. Vitamin D deficiency  - VITAMIN D 25 Hydroxy  6. Prediabetes  - EKG 12-Lead - [place], RETROPERITNL ABD,  LTD - Hemoglobin A1c - Insulin, random  7. Morbid obesity (BMI 38.65)  (HCC)  - TSH  - Add Topiramate with Phentermine  8. Testosterone Deficiency  - Testosterone  9. BPH with obstruction/lower urinary tract symptoms  - PSA  10. Prostate cancer screening  - PSA  11. Screening examination for pulmonary tuberculosis   12. Screening for ischemic heart disease  - EKG 12-Lead  13. FH: hypertension  - EKG 12-Lead - [place], RETROPERITNL ABD,  LTD  14. Former smoker  - EKG 12-Lead - [place], RETROPERITNL ABD,  LTD  15. Screening for AAA (aortic abdominal aneurysm)  - [place], RETROPERITNL ABD,  LTD  16. Fatigue  - Iron,Total/Total Iron Binding Cap - Vitamin B12 - CBC with Differential/Platelet - TSH  17. Medication management  - Urinalysis, Routine w reflex microscopic - Microalbumin / creatinine urine ratio - CBC with Differential/Platelet - COMPLETE METABOLIC PANEL WITH GFR - Magnesium - Lipid panel - TSH - Hemoglobin A1c - Insulin, random - VITAMIN D 25 Hydroxy         Patient was counseled in prudent diet, weight control to achieve/maintain BMI less than 25, BP monitoring, regular exercise and medications as  discussed.  Discussed med effects and SE's. Routine screening labs and tests as requested with regular follow-up as recommended. Over 40 minutes of exam, counseling, chart review and high complex critical decision making was performed   Kirtland Bouchard, MD

## 2020-02-13 NOTE — Patient Instructions (Signed)

## 2020-02-14 ENCOUNTER — Ambulatory Visit: Payer: 59 | Admitting: Internal Medicine

## 2020-02-14 ENCOUNTER — Other Ambulatory Visit: Payer: Self-pay

## 2020-02-14 VITALS — BP 124/84 | HR 87 | Temp 97.4°F | Resp 16 | Ht 70.0 in | Wt 269.4 lb

## 2020-02-14 DIAGNOSIS — R350 Frequency of micturition: Secondary | ICD-10-CM

## 2020-02-14 DIAGNOSIS — Z23 Encounter for immunization: Secondary | ICD-10-CM

## 2020-02-14 DIAGNOSIS — R5383 Other fatigue: Secondary | ICD-10-CM

## 2020-02-14 DIAGNOSIS — Z1329 Encounter for screening for other suspected endocrine disorder: Secondary | ICD-10-CM

## 2020-02-14 DIAGNOSIS — R7309 Other abnormal glucose: Secondary | ICD-10-CM

## 2020-02-14 DIAGNOSIS — N401 Enlarged prostate with lower urinary tract symptoms: Secondary | ICD-10-CM

## 2020-02-14 DIAGNOSIS — Z136 Encounter for screening for cardiovascular disorders: Secondary | ICD-10-CM | POA: Diagnosis not present

## 2020-02-14 DIAGNOSIS — Z79899 Other long term (current) drug therapy: Secondary | ICD-10-CM

## 2020-02-14 DIAGNOSIS — Z111 Encounter for screening for respiratory tuberculosis: Secondary | ICD-10-CM | POA: Diagnosis not present

## 2020-02-14 DIAGNOSIS — Z87891 Personal history of nicotine dependence: Secondary | ICD-10-CM

## 2020-02-14 DIAGNOSIS — Z0000 Encounter for general adult medical examination without abnormal findings: Secondary | ICD-10-CM

## 2020-02-14 DIAGNOSIS — I10 Essential (primary) hypertension: Secondary | ICD-10-CM | POA: Diagnosis not present

## 2020-02-14 DIAGNOSIS — Z8249 Family history of ischemic heart disease and other diseases of the circulatory system: Secondary | ICD-10-CM | POA: Diagnosis not present

## 2020-02-14 DIAGNOSIS — E782 Mixed hyperlipidemia: Secondary | ICD-10-CM

## 2020-02-14 DIAGNOSIS — E291 Testicular hypofunction: Secondary | ICD-10-CM

## 2020-02-14 DIAGNOSIS — Z130 Encounter for screening for diseases of the blood and blood-forming organs and certain disorders involving the immune mechanism: Secondary | ICD-10-CM | POA: Diagnosis not present

## 2020-02-14 DIAGNOSIS — Z13220 Encounter for screening for lipoid disorders: Secondary | ICD-10-CM | POA: Diagnosis not present

## 2020-02-14 DIAGNOSIS — N138 Other obstructive and reflux uropathy: Secondary | ICD-10-CM

## 2020-02-14 DIAGNOSIS — R7303 Prediabetes: Secondary | ICD-10-CM

## 2020-02-14 DIAGNOSIS — Z1389 Encounter for screening for other disorder: Secondary | ICD-10-CM | POA: Diagnosis not present

## 2020-02-14 DIAGNOSIS — Z131 Encounter for screening for diabetes mellitus: Secondary | ICD-10-CM

## 2020-02-14 DIAGNOSIS — M5441 Lumbago with sciatica, right side: Secondary | ICD-10-CM

## 2020-02-14 DIAGNOSIS — E559 Vitamin D deficiency, unspecified: Secondary | ICD-10-CM | POA: Diagnosis not present

## 2020-02-14 DIAGNOSIS — Z125 Encounter for screening for malignant neoplasm of prostate: Secondary | ICD-10-CM | POA: Diagnosis not present

## 2020-02-14 DIAGNOSIS — Z0001 Encounter for general adult medical examination with abnormal findings: Secondary | ICD-10-CM

## 2020-02-14 MED ORDER — DEXAMETHASONE 4 MG PO TABS: ORAL_TABLET | ORAL | 0 refills | Status: DC

## 2020-02-14 MED ORDER — TOPIRAMATE 50 MG PO TABS: ORAL_TABLET | ORAL | 0 refills | Status: DC

## 2020-02-15 LAB — CBC WITH DIFFERENTIAL/PLATELET
Absolute Monocytes: 695 cells/uL (ref 200–950)
Basophils Absolute: 62 cells/uL (ref 0–200)
Basophils Relative: 0.7 %
Eosinophils Absolute: 141 cells/uL (ref 15–500)
Eosinophils Relative: 1.6 %
HCT: 42.6 % (ref 38.5–50.0)
Hemoglobin: 14.3 g/dL (ref 13.2–17.1)
Lymphs Abs: 1734 cells/uL (ref 850–3900)
MCH: 30.8 pg (ref 27.0–33.0)
MCHC: 33.6 g/dL (ref 32.0–36.0)
MCV: 91.8 fL (ref 80.0–100.0)
MPV: 11.8 fL (ref 7.5–12.5)
Monocytes Relative: 7.9 %
Neutro Abs: 6169 cells/uL (ref 1500–7800)
Neutrophils Relative %: 70.1 %
Platelets: 210 10*3/uL (ref 140–400)
RBC: 4.64 10*6/uL (ref 4.20–5.80)
RDW: 11.8 % (ref 11.0–15.0)
Total Lymphocyte: 19.7 %
WBC: 8.8 10*3/uL (ref 3.8–10.8)

## 2020-02-15 LAB — COMPLETE METABOLIC PANEL WITH GFR
AG Ratio: 2 (calc) (ref 1.0–2.5)
ALT: 22 U/L (ref 9–46)
AST: 17 U/L (ref 10–35)
Albumin: 4.7 g/dL (ref 3.6–5.1)
Alkaline phosphatase (APISO): 75 U/L (ref 35–144)
BUN: 19 mg/dL (ref 7–25)
CO2: 30 mmol/L (ref 20–32)
Calcium: 9.6 mg/dL (ref 8.6–10.3)
Chloride: 102 mmol/L (ref 98–110)
Creat: 0.93 mg/dL (ref 0.70–1.33)
GFR, Est African American: 107 mL/min/{1.73_m2} (ref >=60)
GFR, Est Non African American: 92 mL/min/{1.73_m2} (ref >=60)
Globulin: 2.4 g/dL (calc) (ref 1.9–3.7)
Glucose, Bld: 90 mg/dL (ref 65–99)
Potassium: 4.3 mmol/L (ref 3.5–5.3)
Sodium: 140 mmol/L (ref 135–146)
Total Bilirubin: 0.6 mg/dL (ref 0.2–1.2)
Total Protein: 7.1 g/dL (ref 6.1–8.1)

## 2020-02-15 LAB — TESTOSTERONE: Testosterone: 275 ng/dL (ref 250–827)

## 2020-02-15 LAB — MICROALBUMIN / CREATININE URINE RATIO
Creatinine, Urine: 170 mg/dL (ref 20–320)
Microalb Creat Ratio: 5 mcg/mg creat (ref <30)
Microalb, Ur: 0.9 mg/dL

## 2020-02-15 LAB — URINALYSIS, ROUTINE W REFLEX MICROSCOPIC
Bacteria, UA: NONE SEEN /HPF
Bilirubin Urine: NEGATIVE
Glucose, UA: NEGATIVE
Hgb urine dipstick: NEGATIVE
Ketones, ur: NEGATIVE
Leukocytes,Ua: NEGATIVE
Nitrite: NEGATIVE
Specific Gravity, Urine: 1.02 (ref 1.001–1.03)
Squamous Epithelial / HPF: NONE SEEN /HPF (ref <=5)
pH: 5.5 (ref 5.0–8.0)

## 2020-02-15 LAB — MAGNESIUM: Magnesium: 2.1 mg/dL (ref 1.5–2.5)

## 2020-02-15 LAB — INSULIN, RANDOM: Insulin: 19.5 u[IU]/mL

## 2020-02-15 LAB — VITAMIN B12: Vitamin B-12: 373 pg/mL (ref 200–1100)

## 2020-02-15 LAB — LIPID PANEL
Cholesterol: 160 mg/dL (ref <200)
HDL: 44 mg/dL (ref >=40)
LDL Cholesterol (Calc): 94 mg/dL (calc)
Non-HDL Cholesterol (Calc): 116 mg/dL (calc) (ref <130)
Total CHOL/HDL Ratio: 3.6 (calc) (ref <5.0)
Triglycerides: 122 mg/dL (ref <150)

## 2020-02-15 LAB — PSA: PSA: 0.25 ng/mL (ref <=4.0)

## 2020-02-15 LAB — IRON, TOTAL/TOTAL IRON BINDING CAP
%SAT: 30 % (calc) (ref 20–48)
Iron: 102 ug/dL (ref 50–180)
TIBC: 342 mcg/dL (calc) (ref 250–425)

## 2020-02-15 LAB — TSH: TSH: 1.89 mIU/L (ref 0.40–4.50)

## 2020-02-15 LAB — HEMOGLOBIN A1C
Hgb A1c MFr Bld: 5.8 % of total Hgb — ABNORMAL HIGH (ref <5.7)
Mean Plasma Glucose: 120 (calc)
eAG (mmol/L): 6.6 (calc)

## 2020-02-15 LAB — VITAMIN D 25 HYDROXY (VIT D DEFICIENCY, FRACTURES): Vit D, 25-Hydroxy: 79 ng/mL (ref 30–100)

## 2020-02-15 NOTE — Progress Notes (Signed)
========================================================== -   Test results slightly outside the reference range are not unusual. If there is anything important, I will review this with you,  otherwise it is considered normal test values.  If you have further questions,  please do not hesitate to contact me at the office or via My Chart.  ==========================================================  -  Iron Level is Normal   - Vitamin B12 = 373 is low  (Ideal or Goal is between 450-1,100)   - So, Recommend take a sublingual Vitamin B12 tablet 1,000 to 5,000  mcg that you dissolve under the tongue Daily  ==========================================================  -  PSA is Low - great  ==========================================================  -  Testosterone level is low Normal - Recommend that you take  Zinc 50 mg tablet to help raise  your testosterone levels naturally ==========================================================  -  Total Chol = 160 and LDL Chol = 94 - Both  Excellent   - Very low risk for Heart Attack  / Stroke =============================================================  - A1c = 5.8% Blood sugar and A1c are elevated again in the borderline and  early or pre-diabetes range which has the same   300% increased risk for heart attack, stroke, cancer and   alzheimer- type vascular dementia as full blown diabetes.     -  It is very important that you work harder with diet by  avoiding all foods that are white except chicken,   fish & calliflower.  - Avoid white rice  (brown & wild rice is OK),   - Avoid white potatoes  (sweet potatoes in moderation is OK),   White bread or wheat bread or anything made out of   white flour like bagels, donuts, rolls, buns, biscuits, cakes,  - pastries, cookies, pizza crust, and pasta (made from  white flour & egg whites)   - vegetarian pasta or spinach or wheat pasta is OK.  - Multigrain breads like Arnold's,  Pepperidge Farm or   multigrain sandwich thins or high fiber breads like   [place] bread or "Dave's Killer" breads that are  4 to 5 grams fiber per slice !  are best.    Diet, exercise and weight loss can reverse and cure  diabetes in the early stages.   ==========================================================  -   Vitamin D = 79 - Excellent  ==========================================================  -  All Else - CBC - Kidneys - Electrolytes - Liver - Magnesium & Thyroid    - all  Normal / OK ====================================================   - Keep up the [place] Work ! ==========================================================

## 2020-02-25 ENCOUNTER — Other Ambulatory Visit: Payer: Self-pay | Admitting: Internal Medicine

## 2020-02-25 DIAGNOSIS — Z6841 Body Mass Index (BMI) 40.0 and over, adult: Secondary | ICD-10-CM

## 2020-04-06 ENCOUNTER — Other Ambulatory Visit: Payer: Self-pay | Admitting: Internal Medicine

## 2020-05-07 ENCOUNTER — Other Ambulatory Visit: Payer: Self-pay | Admitting: Internal Medicine

## 2020-05-17 ENCOUNTER — Ambulatory Visit: Payer: 59 | Admitting: Adult Health

## 2020-05-17 ENCOUNTER — Other Ambulatory Visit: Payer: Self-pay

## 2020-05-17 ENCOUNTER — Encounter: Payer: Self-pay | Admitting: Adult Health

## 2020-05-17 VITALS — BP 122/90 | HR 89 | Temp 97.1°F | Wt 267.0 lb

## 2020-05-17 DIAGNOSIS — E559 Vitamin D deficiency, unspecified: Secondary | ICD-10-CM

## 2020-05-17 DIAGNOSIS — E782 Mixed hyperlipidemia: Secondary | ICD-10-CM

## 2020-05-17 DIAGNOSIS — I10 Essential (primary) hypertension: Secondary | ICD-10-CM | POA: Diagnosis not present

## 2020-05-17 DIAGNOSIS — Z79899 Other long term (current) drug therapy: Secondary | ICD-10-CM

## 2020-05-17 DIAGNOSIS — R7309 Other abnormal glucose: Secondary | ICD-10-CM | POA: Diagnosis not present

## 2020-05-17 NOTE — Patient Instructions (Addendum)
Goals    . Blood Pressure < 130/80    . Weight (lb) < 250 lb (113.4 kg)        Suggest wife reduce zinc if taking 50 mg daily - alternate or get new lower dose suppleemnt, 40 mg daily max  Try topamax/topiramate with dinner - if helps with bedtime munchies, continue If no benefit, doesn't help more than just phentermine - STOP    High-Fiber Eating Plan Fiber, also called dietary fiber, is a type of carbohydrate. It is found foods such as fruits, vegetables, whole grains, and beans. A high-fiber diet can have many health benefits. Your health care provider may recommend a high-fiber diet to help:  Prevent constipation. Fiber can make your bowel movements more regular.  Lower your cholesterol.  Relieve the following conditions: ? Inflammation of veins in the anus (hemorrhoids). ? Inflammation of specific areas of the digestive tract (uncomplicated diverticulosis). ? A problem of the large intestine, also called the colon, that sometimes causes pain and diarrhea (irritable bowel syndrome, or IBS).  Prevent overeating as part of a weight-loss plan.  Prevent heart disease, type 2 diabetes, and certain cancers. What are tips for following this plan? Reading food labels  Check the nutrition facts label on food products for the amount of dietary fiber. Choose foods that have 5 grams of fiber or more per serving.  The goals for recommended daily fiber intake include: ? Men (age 46 or younger): 34-38 g. ? Men (over age 57): 28-34 g. ? Women (age 62 or younger): 25-28 g. ? Women (over age 57): 22-25 g. Your daily fiber goal is _____________ g.   Shopping  Choose whole fruits and vegetables instead of processed forms, such as apple juice or applesauce.  Choose a wide variety of high-fiber foods such as avocados, lentils, oats, and kidney beans.  Read the nutrition facts label of the foods you choose. Be aware of foods with added fiber. These foods often have high sugar and sodium  amounts per serving. Cooking  Use whole-grain flour for baking and cooking.  Cook with brown rice instead of white rice. Meal planning  Start the day with a breakfast that is high in fiber, such as a cereal that contains 5 g of fiber or more per serving.  Eat breads and cereals that are made with whole-grain flour instead of refined flour or white flour.  Eat brown rice, bulgur wheat, or millet instead of white rice.  Use beans in place of meat in soups, salads, and pasta dishes.  Be sure that half of the grains you eat each day are whole grains. General information  You can get the recommended daily intake of dietary fiber by: ? Eating a variety of fruits, vegetables, grains, nuts, and beans. ? Taking a fiber supplement if you are not able to take in enough fiber in your diet. It is better to get fiber through food than from a supplement.  Gradually increase how much fiber you consume. If you increase your intake of dietary fiber too quickly, you may have bloating, cramping, or gas.  Drink plenty of water to help you digest fiber.  Choose high-fiber snacks, such as berries, raw vegetables, nuts, and popcorn. What foods should I eat? Fruits Berries. Pears. Apples. Oranges. Avocado. Prunes and raisins. Dried figs. Vegetables Sweet potatoes. Spinach. Kale. Artichokes. Cabbage. Broccoli. Cauliflower. Green peas. Carrots. Squash. Grains Whole-grain breads. Multigrain cereal. Oats and oatmeal. Brown rice. Barley. Bulgur wheat. [place]. Quinoa. Bran muffins. Popcorn. Rye  wafer crackers. Meats and other proteins Navy beans, kidney beans, and pinto beans. Soybeans. Split peas. Lentils. Nuts and seeds. Dairy Fiber-fortified yogurt. Beverages Fiber-fortified soy milk. Fiber-fortified orange juice. Other foods Fiber bars. The items listed above may not be a complete list of recommended foods and beverages. Contact a dietitian for more information. What foods should I  avoid? Fruits Fruit juice. Cooked, strained fruit. Vegetables Fried potatoes. Canned vegetables. Well-cooked vegetables. Grains White bread. Pasta made with refined flour. White rice. Meats and other proteins Fatty cuts of meat. Fried chicken or fried fish. Dairy Milk. Yogurt. Cream cheese. Sour cream. Fats and oils Butters. Beverages Soft drinks. Other foods Cakes and pastries. The items listed above may not be a complete list of foods and beverages to avoid. Talk with your dietitian about what choices are best for you. Summary  Fiber is a type of carbohydrate. It is found in foods such as fruits, vegetables, whole grains, and beans.  A high-fiber diet has many benefits. It can help to prevent constipation, lower blood cholesterol, aid weight loss, and reduce your risk of heart disease, diabetes, and certain cancers.  Increase your intake of fiber gradually. Increasing fiber too quickly may cause cramping, bloating, and gas. Drink plenty of water while you increase the amount of fiber you consume.  The best sources of fiber include whole fruits and vegetables, whole grains, nuts, seeds, and beans. This information is not intended to replace advice given to you by your health care provider. Make sure you discuss any questions you have with your health care provider. Document Revised: 08/26/2019 Document Reviewed: 08/26/2019 Elsevier Patient Education  2021 [employer].

## 2020-05-17 NOTE — Progress Notes (Signed)
FOLLOW UP  Assessment and Plan:   Hypertension Well controlled systolic, persistently elevated diastolic today;  He does check at home, typically at goal   Discussed reducing sodium intake, try "no salt," increase water Monitor blood pressure at home; patient to call if consistently greater than 130/80, will consider switching to combo pill with low dose HCTZ Continue DASH diet.   Reminder to go to the ER if any CP, SOB, nausea, dizziness, severe HA, changes vision/speech, left arm numbness and tingling and jaw pain.  Cholesterol Currently at goal; continue atorvastatin Continue low cholesterol diet and exercise.  Check lipid panel.   Hx of prediabetes A1Cs significantly improved with weight loss Continue diet and exercise.  Perform daily foot/skin check, notify office of any concerning changes.  Check A1C q94m, defer today, check CMP/GFR  Morbid obesity - BMI 38 with prediabetes, htn, hyperlipidemia Long discussion about weight loss, diet, and exercise Discussed final goal weight and current weight loss goal (250 lb) Patient has been on phentermine with benefit and no SE- On topamax but not sure helping - try with dinner, if no benefit STOP  Vitamin D Def At goal at last visit; off of high dose  Recommend supplementation to maintain goal of 60-100 Check Vit D level next OV  Testosterone deficiency Declines supplement; encouraged weight loss, continue zinc 50 mg supplement   Continue diet and meds as discussed. Further disposition pending results of labs. Discussed med's effects and SE's.   Over 30 minutes of exam, counseling, chart review, and critical decision making was performed.   Future Appointments  Date Time Provider [place]  08/17/2020  4:00 PM Unk Pinto, MD [facility] None  02/27/2021  3:00 PM Unk Pinto, MD [facility] None     ----------------------------------------------------------------------------------------------------------------------  HPI 55 y.o. male  presents for 3 month follow up on hypertension, cholesterol, hx of prediabetes, morbid obesity and vitamin D deficiency.   BMI is Body mass index is 38.31 kg/m., he is working on diet and exercise. He is prescribed phentermine. He has been trying to make better diet choices, walking daily, watching snacking. He has lost 2 lb, denies SE with phentermine, denies palpitations, insomnia, anxiety.  Wt Readings from Last 3 Encounters:  05/17/20 267 lb (121.1 kg)  02/14/20 269 lb 6.4 oz (122.2 kg)  11/29/19 (!) 250 lb (113.4 kg)   His blood pressure has been controlled at home (110-130s/80s), today their BP is BP: (!) 124/92, similar on recheck   He does not workout but works a physically active job. He denies chest pain, shortness of breath, dizziness.   He is on cholesterol medication (atorvastatin 20 mg daily) and denies myalgias. His cholesterol is not at goal. The cholesterol last visit was:  Lab Results  Component Value Date   CHOL 160 02/14/2020   HDL 44 02/14/2020   LDLCALC 94 02/14/2020   TRIG 122 02/14/2020   CHOLHDL 3.6 02/14/2020    He has been working on diet and exercise for hx of prediabetes, and denies foot ulcerations, increased appetite, nausea, paresthesia of the feet, polydipsia, polyuria, visual disturbances, vomiting and weight loss.  Last A1C in the office was:  Lab Results  Component Value Date   HGBA1C 5.8 (H) 02/14/2020    Last GFR:  Lab Results  Component Value Date   GFRNONAA 92 02/14/2020   Patient is on Vitamin D supplement and above goal at recent check, has cut back from 50000 IU daily to every other day:    Lab Results  Component Value Date   VD25OH 79 02/14/2020     He has testosterone deficiency but no sympotms and declined supplement. Has been taking zinc 50 mg but irregularly. Prescribed sildenafil for  ED. Lab Results  Component Value Date   TESTOSTERONE 275 02/14/2020     Current Medications:  Current Outpatient Medications on File Prior to Visit  Medication Sig  . atorvastatin (LIPITOR) 20 MG tablet TAKE 1 TABLET BY MOUTH EVERY DAY  . Cinnamon 500 MG TABS Take 1 tablet by mouth daily.   . citalopram (CELEXA) 40 MG tablet Take 1 tablet Daily for Mood  . dicyclomine (BENTYL) 20 MG tablet Take 1 tablet 3 x /day as needed   for Nausea, Cramping , Bloating or Diarrhea  . famotidine (PEPCID) 20 MG tablet TAKE 1 TABLET 2 X /DAY AS NEED FOR INDIGESTION & HEARTBURN.  Marland Kitchen ibuprofen (ADVIL) 600 MG tablet Take 600 mg by mouth 3 (three) times daily.  Marland Kitchen lisinopril (ZESTRIL) 10 MG tablet TAKE 1 TABLET DAILY FOR BLOOD PRESSURE  . Multiple Vitamins-Minerals (MULTIVITAMIN WITH MINERALS) tablet Take 1 tablet by mouth daily.  . phentermine (ADIPEX-P) 37.5 MG tablet Take     1 tablet       every Morning        for Dieting & Weight Loss  . sildenafil (VIAGRA) 100 MG tablet Take      1/2 to 1 tablet     Daily        as needed for XXXX  . topiramate (TOPAMAX) 50 MG tablet TAKE 1/2 TO 1 TABLET 2 X /DAY AT SUPPERTIME & BEDTIME FOR DIETING & WEIGHT LOSS  . Vitamin D, Ergocalciferol, (DRISDOL) 1.25 MG (50000 UNIT) CAPS capsule TAKE 1 CAPSULE BY MOUTH ONCE DAILY OR AS DIRECTED (Patient not taking: No sig reported)   No current facility-administered medications on file prior to visit.     Allergies:  Allergies  Allergen Reactions  . Bee Venom Anaphylaxis  . Chantix [Varenicline]      Medical History:  Past Medical History:  Diagnosis Date  . Allergy   . Anxiety   . Chest pain   . Elevated hemoglobin A1c   . GERD (gastroesophageal reflux disease)   . Hyperlipidemia   . Hypertension   . Hypogonadism male   . Obese   . PND (post-nasal drip)   . Prediabetes   . SOB (shortness of breath) on exertion   . Vitamin D deficiency    Family history- Reviewed and unchanged Social history- Reviewed and  unchanged   Review of Systems:  Review of Systems  Constitutional: Negative for malaise/fatigue and weight loss.  HENT: Negative for hearing loss and tinnitus.   Eyes: Negative for blurred vision and double vision.  Respiratory: Negative for cough, shortness of breath and wheezing.   Cardiovascular: Negative for chest pain, palpitations, orthopnea, claudication and leg swelling.  Gastrointestinal: Negative for abdominal pain, blood in stool, constipation, diarrhea, heartburn, melena, nausea and vomiting.  Genitourinary: Negative.   Musculoskeletal: Negative for back pain, falls, joint pain, myalgias and neck pain.  Skin: Negative for rash.  Neurological: Negative for dizziness, tingling, sensory change, weakness and headaches.  Endo/Heme/Allergies: Negative for polydipsia.  Psychiatric/Behavioral: Negative.   All other systems reviewed and are negative.     Physical Exam: BP (!) 124/92   Pulse (!) 107   Temp (!) 97.1 F (36.2 C)   Wt 267 lb (121.1 kg)   SpO2 99%   BMI 38.31 kg/m  Wt Readings  from Last 3 Encounters:  05/17/20 267 lb (121.1 kg)  02/14/20 269 lb 6.4 oz (122.2 kg)  11/29/19 (!) 250 lb (113.4 kg)   General Appearance: Well nourished, in no apparent distress. Eyes: PERRLA, EOMs, conjunctiva no swelling or erythema Sinuses: No Frontal/maxillary tenderness ENT/Mouth: Ext aud canals clear, TMs without erythema, bulging. No erythema, swelling, or exudate on post pharynx.  Tonsils not swollen or erythematous. Hearing normal.  Neck: Supple, thyroid normal.  Respiratory: Respiratory effort normal, BS equal bilaterally without rales, rhonchi, wheezing or stridor.  Cardio: RRR with no MRGs. Brisk peripheral pulses without edema.  Abdomen: Soft, + BS.  Non tender, no guarding, rebound, hernias, masses. Lymphatics: Non tender without lymphadenopathy.  Musculoskeletal: Full ROM, 5/5 strength, Normal gait, Shoulder right: Full range of motion. Neurovascularly intact  distally. Good strength with stress of rotator cuff but causes pain. Positive impingement signs. Skin: Warm, dry without rashes, lesions, ecchymosis.  Neuro: Cranial nerves intact. No cerebellar symptoms.  Psych: Awake and oriented X 3, normal affect, Insight and Judgment appropriate.    Izora Ribas, NP 3:51 PM [facility]

## 2020-05-18 LAB — CBC WITH DIFFERENTIAL/PLATELET
Absolute Monocytes: 986 cells/uL — ABNORMAL HIGH (ref 200–950)
Basophils Absolute: 74 cells/uL (ref 0–200)
Basophils Relative: 0.8 %
Eosinophils Absolute: 214 cells/uL (ref 15–500)
Eosinophils Relative: 2.3 %
HCT: 42.7 % (ref 38.5–50.0)
Hemoglobin: 14.7 g/dL (ref 13.2–17.1)
Lymphs Abs: 2102 cells/uL (ref 850–3900)
MCH: 30.9 pg (ref 27.0–33.0)
MCHC: 34.4 g/dL (ref 32.0–36.0)
MCV: 89.9 fL (ref 80.0–100.0)
MPV: 11.6 fL (ref 7.5–12.5)
Monocytes Relative: 10.6 %
Neutro Abs: 5924 cells/uL (ref 1500–7800)
Neutrophils Relative %: 63.7 %
Platelets: 236 10*3/uL (ref 140–400)
RBC: 4.75 10*6/uL (ref 4.20–5.80)
RDW: 12.1 % (ref 11.0–15.0)
Total Lymphocyte: 22.6 %
WBC: 9.3 10*3/uL (ref 3.8–10.8)

## 2020-05-18 LAB — LIPID PANEL
Cholesterol: 172 mg/dL (ref <200)
HDL: 37 mg/dL — ABNORMAL LOW (ref >=40)
LDL Cholesterol (Calc): 107 mg/dL (calc) — ABNORMAL HIGH
Non-HDL Cholesterol (Calc): 135 mg/dL (calc) — ABNORMAL HIGH (ref <130)
Total CHOL/HDL Ratio: 4.6 (calc) (ref <5.0)
Triglycerides: 160 mg/dL — ABNORMAL HIGH (ref <150)

## 2020-05-18 LAB — COMPLETE METABOLIC PANEL WITH GFR
AG Ratio: 1.9 (calc) (ref 1.0–2.5)
ALT: 17 U/L (ref 9–46)
AST: 15 U/L (ref 10–35)
Albumin: 4.5 g/dL (ref 3.6–5.1)
Alkaline phosphatase (APISO): 77 U/L (ref 35–144)
BUN: 20 mg/dL (ref 7–25)
CO2: 27 mmol/L (ref 20–32)
Calcium: 9.3 mg/dL (ref 8.6–10.3)
Chloride: 106 mmol/L (ref 98–110)
Creat: 1.07 mg/dL (ref 0.70–1.33)
GFR, Est African American: 89 mL/min/{1.73_m2} (ref >=60)
GFR, Est Non African American: 77 mL/min/{1.73_m2} (ref >=60)
Globulin: 2.4 g/dL (calc) (ref 1.9–3.7)
Glucose, Bld: 91 mg/dL (ref 65–99)
Potassium: 4.1 mmol/L (ref 3.5–5.3)
Sodium: 141 mmol/L (ref 135–146)
Total Bilirubin: 0.6 mg/dL (ref 0.2–1.2)
Total Protein: 6.9 g/dL (ref 6.1–8.1)

## 2020-05-18 LAB — MAGNESIUM: Magnesium: 2.1 mg/dL (ref 1.5–2.5)

## 2020-05-18 LAB — TSH: TSH: 1.95 mIU/L (ref 0.40–4.50)

## 2020-06-25 ENCOUNTER — Other Ambulatory Visit: Payer: Self-pay | Admitting: Internal Medicine

## 2020-08-17 ENCOUNTER — Ambulatory Visit: Payer: 59 | Admitting: Internal Medicine

## 2020-08-31 ENCOUNTER — Other Ambulatory Visit: Payer: Self-pay | Admitting: Internal Medicine

## 2020-08-31 DIAGNOSIS — K589 Irritable bowel syndrome without diarrhea: Secondary | ICD-10-CM

## 2020-09-15 ENCOUNTER — Other Ambulatory Visit: Payer: Self-pay | Admitting: Internal Medicine

## 2020-11-26 ENCOUNTER — Other Ambulatory Visit: Payer: Self-pay | Admitting: Internal Medicine

## 2021-01-12 ENCOUNTER — Other Ambulatory Visit: Payer: Self-pay | Admitting: Internal Medicine

## 2021-01-12 MED ORDER — SILDENAFIL CITRATE 100 MG PO TABS
ORAL_TABLET | ORAL | 1 refills | Status: DC
Start: 2021-01-12 — End: 2022-11-29

## 2021-02-27 ENCOUNTER — Encounter: Payer: 59 | Admitting: Internal Medicine

## 2021-03-04 ENCOUNTER — Encounter: Payer: Self-pay | Admitting: Internal Medicine

## 2021-03-04 NOTE — Patient Instructions (Signed)

## 2021-03-04 NOTE — Progress Notes (Signed)
Annual  Screening/Preventative Visit  & Comprehensive Evaluation & Examination  Future Appointments  Date Time Provider [place]  03/05/2021 10:00 AM Unk Pinto, MD [facility] None  03/05/2022 10:00 AM Unk Pinto, MD [facility] None            This very nice 55 y.o. MWM presents for a Screening /Preventative Visit & comprehensive evaluation and management of multiple medical co-morbidities.  Patient has been followed for HTN, HLD, T2_NIDDM  and Vitamin D Deficiency.       HTN predates since  1999. Patient's BP has been controlled at home.  Today's BP is at goal - 138/90. Patient denies any cardiac symptoms as chest pain, palpitations, shortness of breath, dizziness or ankle swelling.       Patient's hyperlipidemia is controlled with diet and Atorvastatin. Patient denies myalgias or other medication SE's. Last lipids were not at goal:   Lab Results  Component Value Date   CHOL 172 05/17/2020   HDL 37 (L) 05/17/2020   LDLCALC 107 (H) 05/17/2020   TRIG 160 (H) 05/17/2020   CHOLHDL 4.6 05/17/2020         Patient has Morbid Obesity  (BMI 38.3+) & hx/o prediabetes  (A1c 5.9%/1999) then T2_DM (A1c 6.6%/2017)  w/CKD2  (GFR 77)  and is attempting control by diet.  In 2018, he lost from 290# down 50# to 240# and currently is back up to 279#  (MI 39.9).   Patient denies reactive hypoglycemic symptoms, visual blurring, diabetic polys or paresthesias. Last A1c was not at goal :   Lab Results  Component Value Date   HGBA1C 5.8 (H) 02/14/2020                                              Patient has hx/o low Testosterone & has declined replacement therapy.         Finally, patient has history of Vitamin D Deficiency ("16" /2008) and last vitamin D was at goal :   Lab Results  Component Value Date   VD25OH 79 02/14/2020     Current Outpatient Medications on File Prior to Visit  Medication Sig   atorvastatin 20 MG tablet TAKE 1 TABLET EVERY DAY   Cinnamon 500 MG  TABS Take  daily.    citalopram 40 MG tablet Take  1 tablet  Daily  for Mood   dicyclomine 20 MG tablet TAKE 1 TABLET 3 X /DAY AS NEEDED FOR NAUSEA, CRAMPING , BLOATING OR DIARRHEA   famotidine  20 MG tablet TAKE 1 TABLET 2 X /DAY AS NEEDED   ibuprofen 600 MG tablet Take 3  times daily.   lisinopril 10 MG tablet Take  1 tablet  Daily  for BP    Multi-Vit -Minerals  Take 1 tablet  daily.   phentermine 37.5 MG tablet TAKE 1 TABLET EVERY MORNING    sildenafil  100 MG tablet Take 1/2 to 1 tablet  Daily  as needed for  XXXX   topiramate 50 MG tablet TAKE 1/2 TO 1 TABLET 2 X /DAY    zinc  50 MG tablet Take  daily.    Allergies  Allergen Reactions   Bee Venom Anaphylaxis   Chantix [Varenicline]      Past Medical History:  Diagnosis Date   Allergy    Anxiety    Chest pain    Elevated  hemoglobin A1c    GERD     Hyperlipidemia    Hypertension    Hypogonadism male    Obese    PND (post-nasal drip)    Prediabetes    SOB  on exertion    Vitamin D deficiency      Health Maintenance  Topic Date Due   Hepatitis C Screening  Never done   Pneumococcal Vaccine 20-39 Years old (2 - PCV) 11/05/2013   COVID-19 Vaccine (3 - Pfizer risk series) 09/06/2019   INFLUENZA VACCINE  12/04/2020   TETANUS/TDAP  05/05/2022   COLONOSCOPY  11/29/2022   Zoster Vaccines- Shingrix  Completed   HPV VACCINES  Aged Out   HIV Screening  Discontinued     Immunization History  Administered Date(s) Administered   Influenza Inj Mdck Quad   02/22/2019   Influenza Inj Mdck Quad   02/28/2017, 02/14/2020   Influenza  12/08/2017   PFIZER SARS-COV-2 Vacc  07/16/2019, 08/09/2019   PPD Test 12/10/2017, 01/13/2019, 02/14/2020   Pneumococcal  -23 11/05/2012   Pneumococcal -23 05/06/1998   Td 05/06/2001   Tdap 05/05/2012   Zoster Recombinat (Shingrix) 09/27/2017, 12/30/2017    Colon - 10/19/2014 Tubular adenoma - Dr Deatra Ina  and  then  Last  colon on  11/29/2019 - Dr Loletha Carrow - Recc 3 year f/u Colon    Past  Surgical History:  Procedure Laterality Date   52 HOUR [place] STUDY     COLONOSCOPY     LEFT HEART CATHETERIZATION WITH CORONARY ANGIOGRAM N/A 02/19/2013   Procedure: LEFT HEART CATHETERIZATION WITH CORONARY ANGIOGRAM;  Surgeon: Josue Hector, MD;  Location: [facility] C[facility] LAB;  Service: Cardiovascular;  Laterality: N/A;   POLYPECTOMY     SKIN CANCER EXCISION  2004   squamous cell     Family History  Problem Relation Age of Onset   Hypertension Mother    COPD Mother    Diabetes Sister    Cancer Father        Lung cancer   Colon cancer Neg Hx    Esophageal cancer Neg Hx    Rectal cancer Neg Hx    Stomach cancer Neg Hx      Social History   Tobacco Use   Smoking status: Former    Types: Cigarettes    Quit date: 05/01/2012    Years since quitting: 8.8   Smokeless tobacco: Never  Substance Use Topics   Alcohol use: Yes    Alcohol/week: 0.0 standard drinks    Comment: very little   Drug use: No      ROS Constitutional: Denies fever, chills, weight loss/gain, headaches, insomnia,  night sweats or change in appetite. Does c/o fatigue. Eyes: Denies redness, blurred vision, diplopia, discharge, itchy or watery eyes.  ENT: Denies discharge, congestion, post nasal drip, epistaxis, sore throat, earache, hearing loss, dental pain, Tinnitus, Vertigo, Sinus pain or snoring.  Cardio: Denies chest pain, palpitations, irregular heartbeat, syncope, dyspnea, diaphoresis, orthopnea, PND, claudication or edema Respiratory: denies cough, dyspnea, DOE, pleurisy, hoarseness, laryngitis or wheezing.  Gastrointestinal: Denies dysphagia, heartburn, reflux, water brash, pain, cramps, nausea, vomiting, bloating, diarrhea, constipation, hematemesis, melena, hematochezia, jaundice or hemorrhoids Genitourinary: Denies dysuria, frequency, discharge, hematuria or flank pain. Has urgency, nocturia x 2-3 & occasional hesitancy. Musculoskeletal: Denies arthralgia, myalgia, stiffness, Jt. Swelling, pain, limp or  strain/sprain. Denies Falls. Skin: Denies puritis, rash, hives, warts, acne, eczema or change in skin lesion Neuro: No weakness, tremor, incoordination, spasms, paresthesia or pain Psychiatric: Denies confusion, memory loss  or sensory loss. Denies Depression. Endocrine: Denies change in weight, skin, hair change, nocturia, and paresthesia, diabetic polys, visual blurring or hyper / hypo glycemic episodes.  Heme/Lymph: No excessive bleeding, bruising or enlarged lymph nodes.   Physical Exam  BP 138/90   Pulse 79   Temp 97.9 F (36.6 C)   Resp 16   Ht 5\' 10"  (1.778 m)   Wt 278 lb 6.4 oz (126.3 kg)   SpO2 99%   BMI 39.95 kg/m   General Appearance: Well nourished and well groomed and in no apparent distress.  Eyes: PERRLA, EOMs, conjunctiva no swelling or erythema, normal fundi and vessels. Sinuses: No frontal/maxillary tenderness ENT/Mouth: EACs patent / TMs  nl. Nares clear without erythema, swelling, mucoid exudates. Oral hygiene is good. No erythema, swelling, or exudate. Tongue normal, non-obstructing. Tonsils not swollen or erythematous. Hearing normal.  Neck: Supple, thyroid not palpable. No bruits, nodes or JVD. Respiratory: Respiratory effort normal.  BS equal and clear bilateral without rales, rhonci, wheezing or stridor. Cardio: Heart sounds are normal with regular rate and rhythm and no murmurs, rubs or gallops. Peripheral pulses are normal and equal bilaterally without edema. No aortic or femoral bruits. Chest: symmetric with normal excursions and percussion.  Abdomen: Soft, with Nl bowel sounds. Nontender, no guarding, rebound, hernias, masses, or organomegaly.  Lymphatics: Non tender without lymphadenopathy.  Musculoskeletal: Full ROM all peripheral extremities, joint stability, 5/5 strength, and normal gait. Skin: Warm and dry without rashes, lesions, cyanosis, clubbing or  ecchymosis.  Neuro: Cranial nerves intact, reflexes equal bilaterally. Normal muscle tone, no  cerebellar symptoms. Sensation intact.  Pysch: Alert and oriented X 3 with normal affect, insight and judgment appropriate.   Assessment and Plan  1. Annual Preventative/Screening Exam    2. Essential hypertension  - EKG 12-Lead - [place], RETROPERITNL ABD,  LTD - Urinalysis, Routine w reflex microscopic - Microalbumin / creatinine urine ratio - CBC with Differential/Platelet - COMPLETE METABOLIC PANEL WITH GFR - Magnesium - TSH  3. Hyperlipidemia associated with type 2 diabetes mellitus ([place])  - EKG 12-Lead - [place], RETROPERITNL ABD,  LTD - Lipid panel - TSH  4. Type 2 diabetes mellitus with stage 2 chronic kidney  disease, without long-term current use of insulin (HCC)  - EKG 12-Lead - [place], RETROPERITNL ABD,  LTD - Urinalysis, Routine w reflex microscopic - Microalbumin / creatinine urine ratio - HM DIABETES FOOT EXAM - LOW EXTREMITY NEUR EXAM DOCUM - Hemoglobin A1c - Insulin, random  5. Vitamin D deficiency  - VITAMIN D 25 Hydroxy  6. Testosterone Deficiency  - Testosterone  7. BPH with obstruction/lower urinary tract symptoms  - PSA  8. Prostate cancer screening  - PSA  9. Screening examination for pulmonary tuberculosis  - TB Skin Test  10. Screening for ischemic heart disease  - EKG 12-Lead  11. FH: hypertension  - EKG 12-Lead - [place], RETROPERITNL ABD,  LTD  12. Former smoker  - EKG 12-Lead - [place], RETROPERITNL ABD,  LTD  13. Screening for AAA (aortic abdominal aneurysm)  - [place], RETROPERITNL ABD,  LTD  14. Fatigue  - Iron, Total/Total Iron Binding Cap - Vitamin B12 - Testosterone - CBC with Differential/Platelet - TSH  15. Medication management  - Microalbumin / creatinine urine ratio  16. Screening for colorectal cancer  - POC Hemoccult Bld/Stl          Patient was counseled in prudent diet, weight control to achieve/maintain BMI less than 25, BP monitoring, regular exercise and medications  as discussed.  Discussed med effects and  SE's. Routine screening labs and tests as requested with regular follow-up as recommended. Over 40 minutes of exam, counseling, chart review and high complex critical decision making was performed   Kirtland Bouchard, MD

## 2021-03-05 ENCOUNTER — Ambulatory Visit (INDEPENDENT_AMBULATORY_CARE_PROVIDER_SITE_OTHER): Payer: 59 | Admitting: Internal Medicine

## 2021-03-05 ENCOUNTER — Other Ambulatory Visit: Payer: Self-pay

## 2021-03-05 ENCOUNTER — Encounter: Payer: Self-pay | Admitting: Internal Medicine

## 2021-03-05 VITALS — BP 138/90 | HR 79 | Temp 97.9°F | Resp 16 | Ht 70.0 in | Wt 278.4 lb

## 2021-03-05 DIAGNOSIS — E1122 Type 2 diabetes mellitus with diabetic chronic kidney disease: Secondary | ICD-10-CM

## 2021-03-05 DIAGNOSIS — Z125 Encounter for screening for malignant neoplasm of prostate: Secondary | ICD-10-CM | POA: Diagnosis not present

## 2021-03-05 DIAGNOSIS — Z79899 Other long term (current) drug therapy: Secondary | ICD-10-CM

## 2021-03-05 DIAGNOSIS — R5383 Other fatigue: Secondary | ICD-10-CM

## 2021-03-05 DIAGNOSIS — Z87891 Personal history of nicotine dependence: Secondary | ICD-10-CM

## 2021-03-05 DIAGNOSIS — Z23 Encounter for immunization: Secondary | ICD-10-CM | POA: Diagnosis not present

## 2021-03-05 DIAGNOSIS — Z1329 Encounter for screening for other suspected endocrine disorder: Secondary | ICD-10-CM

## 2021-03-05 DIAGNOSIS — E785 Hyperlipidemia, unspecified: Secondary | ICD-10-CM

## 2021-03-05 DIAGNOSIS — E559 Vitamin D deficiency, unspecified: Secondary | ICD-10-CM | POA: Diagnosis not present

## 2021-03-05 DIAGNOSIS — E1169 Type 2 diabetes mellitus with other specified complication: Secondary | ICD-10-CM

## 2021-03-05 DIAGNOSIS — R350 Frequency of micturition: Secondary | ICD-10-CM | POA: Diagnosis not present

## 2021-03-05 DIAGNOSIS — I10 Essential (primary) hypertension: Secondary | ICD-10-CM | POA: Diagnosis not present

## 2021-03-05 DIAGNOSIS — Z111 Encounter for screening for respiratory tuberculosis: Secondary | ICD-10-CM | POA: Diagnosis not present

## 2021-03-05 DIAGNOSIS — Z1211 Encounter for screening for malignant neoplasm of colon: Secondary | ICD-10-CM

## 2021-03-05 DIAGNOSIS — Z1389 Encounter for screening for other disorder: Secondary | ICD-10-CM

## 2021-03-05 DIAGNOSIS — Z13220 Encounter for screening for lipoid disorders: Secondary | ICD-10-CM

## 2021-03-05 DIAGNOSIS — Z131 Encounter for screening for diabetes mellitus: Secondary | ICD-10-CM | POA: Diagnosis not present

## 2021-03-05 DIAGNOSIS — Z136 Encounter for screening for cardiovascular disorders: Secondary | ICD-10-CM

## 2021-03-05 DIAGNOSIS — Z8249 Family history of ischemic heart disease and other diseases of the circulatory system: Secondary | ICD-10-CM

## 2021-03-05 DIAGNOSIS — N401 Enlarged prostate with lower urinary tract symptoms: Secondary | ICD-10-CM | POA: Diagnosis not present

## 2021-03-05 DIAGNOSIS — Z130 Encounter for screening for diseases of the blood and blood-forming organs and certain disorders involving the immune mechanism: Secondary | ICD-10-CM | POA: Diagnosis not present

## 2021-03-05 DIAGNOSIS — Z0000 Encounter for general adult medical examination without abnormal findings: Secondary | ICD-10-CM | POA: Diagnosis not present

## 2021-03-05 DIAGNOSIS — Z0001 Encounter for general adult medical examination with abnormal findings: Secondary | ICD-10-CM

## 2021-03-05 DIAGNOSIS — E291 Testicular hypofunction: Secondary | ICD-10-CM

## 2021-03-05 DIAGNOSIS — N182 Chronic kidney disease, stage 2 (mild): Secondary | ICD-10-CM

## 2021-03-05 DIAGNOSIS — Z1212 Encounter for screening for malignant neoplasm of rectum: Secondary | ICD-10-CM

## 2021-03-05 DIAGNOSIS — N138 Other obstructive and reflux uropathy: Secondary | ICD-10-CM

## 2021-03-05 MED ORDER — TIRZEPATIDE 2.5 MG/0.5ML ~~LOC~~ SOAJ: 2.5000 mg | SUBCUTANEOUS | 0 refills | Status: DC

## 2021-03-06 LAB — URINALYSIS, ROUTINE W REFLEX MICROSCOPIC
Bilirubin Urine: NEGATIVE
Hgb urine dipstick: NEGATIVE
Ketones, ur: NEGATIVE
Leukocytes,Ua: NEGATIVE
Nitrite: NEGATIVE
Protein, ur: NEGATIVE
Specific Gravity, Urine: 1.021 (ref 1.001–1.035)
pH: <=5 (ref 5.0–8.0)

## 2021-03-06 LAB — IRON, TOTAL/TOTAL IRON BINDING CAP
%SAT: 37 % (calc) (ref 20–48)
Iron: 102 ug/dL (ref 50–180)
TIBC: 275 mcg/dL (calc) (ref 250–425)

## 2021-03-06 LAB — VITAMIN D 25 HYDROXY (VIT D DEFICIENCY, FRACTURES): Vit D, 25-Hydroxy: 32 ng/mL (ref 30–100)

## 2021-03-06 LAB — LIPID PANEL
Cholesterol: 163 mg/dL (ref <200)
HDL: 42 mg/dL (ref >=40)
LDL Cholesterol (Calc): 98 mg/dL (calc)
Non-HDL Cholesterol (Calc): 121 mg/dL (calc) (ref <130)
Total CHOL/HDL Ratio: 3.9 (calc) (ref <5.0)
Triglycerides: 129 mg/dL (ref <150)

## 2021-03-06 LAB — CBC WITH DIFFERENTIAL/PLATELET
Absolute Monocytes: 689 cells/uL (ref 200–950)
Basophils Absolute: 50 cells/uL (ref 0–200)
Basophils Relative: 0.7 %
Eosinophils Absolute: 227 cells/uL (ref 15–500)
Eosinophils Relative: 3.2 %
HCT: 42.4 % (ref 38.5–50.0)
Hemoglobin: 13.9 g/dL (ref 13.2–17.1)
Lymphs Abs: 1789 cells/uL (ref 850–3900)
MCH: 30.4 pg (ref 27.0–33.0)
MCHC: 32.8 g/dL (ref 32.0–36.0)
MCV: 92.8 fL (ref 80.0–100.0)
MPV: 11.8 fL (ref 7.5–12.5)
Monocytes Relative: 9.7 %
Neutro Abs: 4345 cells/uL (ref 1500–7800)
Neutrophils Relative %: 61.2 %
Platelets: 199 10*3/uL (ref 140–400)
RBC: 4.57 10*6/uL (ref 4.20–5.80)
RDW: 12 % (ref 11.0–15.0)
Total Lymphocyte: 25.2 %
WBC: 7.1 10*3/uL (ref 3.8–10.8)

## 2021-03-06 LAB — PSA: PSA: 0.33 ng/mL (ref <=4.00)

## 2021-03-06 LAB — VITAMIN B12: Vitamin B-12: 323 pg/mL (ref 200–1100)

## 2021-03-06 LAB — HEMOGLOBIN A1C
Hgb A1c MFr Bld: 6 % of total Hgb — ABNORMAL HIGH (ref <5.7)
Mean Plasma Glucose: 126 mg/dL
eAG (mmol/L): 7 mmol/L

## 2021-03-06 LAB — MICROALBUMIN / CREATININE URINE RATIO
Creatinine, Urine: 130 mg/dL (ref 20–320)
Microalb Creat Ratio: 2 mcg/mg creat (ref <30)
Microalb, Ur: 0.2 mg/dL

## 2021-03-06 LAB — COMPLETE METABOLIC PANEL WITH GFR
AG Ratio: 1.9 (calc) (ref 1.0–2.5)
ALT: 21 U/L (ref 9–46)
AST: 16 U/L (ref 10–35)
Albumin: 4.4 g/dL (ref 3.6–5.1)
Alkaline phosphatase (APISO): 73 U/L (ref 35–144)
BUN: 18 mg/dL (ref 7–25)
CO2: 27 mmol/L (ref 20–32)
Calcium: 9.1 mg/dL (ref 8.6–10.3)
Chloride: 104 mmol/L (ref 98–110)
Creat: 0.84 mg/dL (ref 0.70–1.30)
Globulin: 2.3 g/dL (calc) (ref 1.9–3.7)
Glucose, Bld: 133 mg/dL — ABNORMAL HIGH (ref 65–99)
Potassium: 4.3 mmol/L (ref 3.5–5.3)
Sodium: 139 mmol/L (ref 135–146)
Total Bilirubin: 0.6 mg/dL (ref 0.2–1.2)
Total Protein: 6.7 g/dL (ref 6.1–8.1)
eGFR: 102 mL/min/{1.73_m2} (ref >=60)

## 2021-03-06 LAB — TESTOSTERONE: Testosterone: 312 ng/dL (ref 250–827)

## 2021-03-06 LAB — MAGNESIUM: Magnesium: 2.1 mg/dL (ref 1.5–2.5)

## 2021-03-06 LAB — TSH: TSH: 2.73 mIU/L (ref 0.40–4.50)

## 2021-03-06 LAB — INSULIN, RANDOM: Insulin: 61.9 u[IU]/mL — ABNORMAL HIGH

## 2021-03-06 NOTE — Progress Notes (Signed)
============================================================ - Test results slightly outside the reference range are not unusual. If there is anything important, I will review this with you,  otherwise it is considered normal test values.  If you have further questions,  please do not hesitate to contact me at the office or via My Chart.  ============================================================ ============================================================  -  Iron level is Normal & OK  ============================================================ ============================================================  -  -  Vitamin B12  =   323        Very Low  (Ideal or Goal Vit B12 is between 450 - 1,100)   Low Vit B12 may be associated with Anemia , Fatigue,   Peripheral Neuropathy, Dementia, "Brain Fog", & Depression  - Recommend take a sub-lingual form of Vitamin B12 tablet   1,000 to 5,000 mcg tab that you dissolve under your tongue /Daily   - Can get Baron Sane - best price at [employer] or on [employer] ============================================================ ============================================================  - PSA - very low - Great  ============================================================ ============================================================  - Testosterone is LOW Normal -  Be sure taking Zinc 50 mg  /day.   Also Daily Exercise helps raise Testosterone levels.   Also, excess body fat converts your testosterone into estrogen    So losing excess body fat helps raise Testosterone levels  ============================================================ ============================================================  -  -  Total  Chol =   163   - Very good                (  Ideal  or  Goal is less than 180  !  )   - But   -  Bad / Dangerous LDL  Chol =   98                     (  Ideal  or  Goal is less than 70  !  )   - Recommend  a stricter low  cholesterol diet   - Cholesterol only comes from animal sources  - ie. meat, dairy, egg yolks  - Eat all the vegetables you want.  - Avoid meat, especially red meat - Beef AND Pork .  - Avoid cheese & dairy - milk & ice cream.     - Cheese is the most concentrated form of trans-fats which  is the worst thing to clog up our arteries.   - Veggie cheese is OK which can be found in the fresh  produce section at [employer] or [employer] or [employer] ============================================================ ============================================================  - A1c = 6.0%  - Blood sugar and A1c are  STILL elevated in the borderline and  early or pre-diabetes range which has the same   300% increased risk for heart attack, stroke, cancer and   alzheimer- type vascular dementia as full blown diabetes.   But the good news is that diet, exercise with  weight loss can cure the early diabetes at this point. ============================================================ ============================================================  - Insulin Level = 61.9  is elevated  shows insulin resistance - a sign of   early diabetes and associated with a 300 % greater risk for   heart attacks,                   strokes,                          cancer &                                   Alzheimer type vascular dementia   - All this can be cured  and prevented with losing weight   - get Dr Fara Olden Fuhrman's book 'the End of Diabetes" and "the End of Dieting"  - and add many years of good health to your  life. ============================================================ ============================================================  - Vitamin  D = 32     - EXTREMELY  LOW  - Vitamin D goal is between 70-100.   - Please make sure that you are taking your Vitamin D as directed.       ( Recommend 10,000 units  /day)   - It is very important as a natural anti-inflammatory and helping the  immune system protect against viral infections, like the Covid-19   helping hair, skin, and nails, as well as reducing stroke and  heart attack risk.   - It helps your bones and helps with mood.  - It also decreases numerous cancer risks so please  take it as directed.   - Low Vit D is associated with a 200-300% higher risk for  CANCER   and 200-300% higher risk for HEART   ATTACK  &  STROKE.    - It is also associated with higher death rate at younger ages,   autoimmune diseases like Rheumatoid arthritis, Lupus,  Multiple Sclerosis.     - Also many other serious conditions, like depression, Alzheimer's  Dementia, infertility, muscle aches, fatigue, fibromyalgia   - just to name a few. ============================================================ ============================================================  - All Else - CBC - Kidneys - Electrolytes - Liver - Magnesium & Thyroid    - all  Normal / OK ===========================================================  ===========================================================

## 2021-03-12 NOTE — Progress Notes (Signed)
Future Appointments  Date Time Provider [place]  03/13/2021  4:00 PM Unk Pinto, MD [facility] None  06/12/2021  4:00 PM Magda Bernheim, NP [facility] None  09/11/2021  4:30 PM Unk Pinto, MD [facility] None  03/05/2022 10:00 AM Unk Pinto, MD [facility] None    History of Present Illness:     Patient is a very nice 55 yo MWM with HTN, HLD, T2_NIDDM who presents for  f/u and also has concerns re: a lesion of his anterior mid  left arm.  Hypertensive system review is negative.   Medications    tirzepatide (MOUNJARO) 2.5 MG/0.5ML Pen, Inject 2.5 mg into the skin once a week.    atorvastatin (LIPITOR) 20 MG tablet, TAKE 1 TABLET BY MOUTH EVERY DAY   lisinopril (ZESTRIL) 10 MG tablet, Take  1 tablet  Daily  for BP / Patient knows to take by mouth   sildenafil (VIAGRA) 100 MG tablet, Take      1/2 to 1 tablet  Daily  as needed for  XXXX   ibuprofen (ADVIL) 600 MG tablet, Take 600 mg by mouth 3 (three) times daily.   Cinnamon 500 MG TABS, Take 1 tablet by mouth daily.    citalopram (CELEXA) 40 MG tablet, Take  1 tablet  Daily  for Mood   dicyclomine (BENTYL) 20 MG tablet, TAKE 1 TABLET 3 X /DAY AS NEEDED FOR NAUSEA, CRAMPING , BLOATING OR DIARRHEA   famotidine (PEPCID) 20 MG tablet, TAKE 1 TABLET 2 X /DAY AS NEED FOR INDIGESTION & HEARTBURN.   Multiple Vitamins-Minerals (MULTIVITAMIN WITH MINERALS) tablet, Take 1 tablet by mouth daily.   zinc gluconate 50 MG tablet, Take 50 mg by mouth daily.  Problem list He has HTN (hypertension); Testosterone Deficiency; Hyperlipidemia; Vitamin D deficiency; Morbid obesity ([place]); and Other abnormal glucose (hx of prediabetes) on their problem list.   Observations/Objective:  BP 120/80   Pulse 98   Temp 97.9 F (36.6 C)   Resp 16   Ht 5\' 10"  (1.778 m)   Wt 271 lb 12.8 oz (123.3 kg)   SpO2 96%   BMI 39.00 kg/m   HEENT - WNL. Neck - supple.  Chest - Clear equal BS. Cor - Nl HS. RRR w/o sig MGR. PP 1(+). No  edema. MS- FROM w/o deformities.  Gait Nl. Neuro -  Nl w/o focal abnormalities. 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   Procedure  ( CPT - 17000 )      After informed consent and aseptic prep with alcohol the above lesion wias anesthetized with 1 ml of Marcaine 0.5%.  Then the lesion was sharply excised by shave technique with a #10 scalpel and then the wound base was hyfrecated for hemostasis and antibiotic ung was applied and covered with a sterile bandage. Patient was instructed in wound care.   Assessment and Plan:  1. Essential hypertension   2. Neoplasm of uncertain behavior of skin    Follow Up Instructions:      I discussed the assessment and treatment plan with the patient. The patient was provided an opportunity to ask questions and all were answered. The patient agreed with the plan and demonstrated an understanding of the instructions.       The patient was advised to call back or seek an in-person evaluation if the symptoms worsen or if the condition fails to improve as  anticipated.    Kirtland Bouchard, MD

## 2021-03-13 ENCOUNTER — Ambulatory Visit: Payer: 59 | Admitting: Internal Medicine

## 2021-03-13 ENCOUNTER — Other Ambulatory Visit: Payer: Self-pay

## 2021-03-13 ENCOUNTER — Encounter: Payer: Self-pay | Admitting: Internal Medicine

## 2021-03-13 VITALS — BP 120/80 | HR 98 | Temp 97.9°F | Resp 16 | Ht 70.0 in | Wt 271.8 lb

## 2021-03-13 DIAGNOSIS — D485 Neoplasm of uncertain behavior of skin: Secondary | ICD-10-CM | POA: Diagnosis not present

## 2021-03-13 DIAGNOSIS — I10 Essential (primary) hypertension: Secondary | ICD-10-CM

## 2021-04-02 ENCOUNTER — Other Ambulatory Visit: Payer: Self-pay | Admitting: Internal Medicine

## 2021-04-02 ENCOUNTER — Other Ambulatory Visit: Payer: Self-pay | Admitting: Adult Health

## 2021-04-02 DIAGNOSIS — K2100 Gastro-esophageal reflux disease with esophagitis, without bleeding: Secondary | ICD-10-CM

## 2021-04-02 DIAGNOSIS — N182 Chronic kidney disease, stage 2 (mild): Secondary | ICD-10-CM

## 2021-04-02 MED ORDER — TIRZEPATIDE 5 MG/0.5ML ~~LOC~~ SOAJ: 5.0000 mg | SUBCUTANEOUS | 0 refills | Status: DC

## 2021-04-16 ENCOUNTER — Other Ambulatory Visit: Payer: Self-pay | Admitting: Internal Medicine

## 2021-04-16 MED ORDER — LOPERAMIDE HCL 2 MG PO TABS: ORAL_TABLET | ORAL | 0 refills | Status: DC

## 2021-05-01 ENCOUNTER — Other Ambulatory Visit: Payer: Self-pay | Admitting: Internal Medicine

## 2021-05-01 DIAGNOSIS — E1122 Type 2 diabetes mellitus with diabetic chronic kidney disease: Secondary | ICD-10-CM

## 2021-05-02 ENCOUNTER — Other Ambulatory Visit: Payer: Self-pay | Admitting: Internal Medicine

## 2021-05-02 DIAGNOSIS — E1122 Type 2 diabetes mellitus with diabetic chronic kidney disease: Secondary | ICD-10-CM

## 2021-05-29 ENCOUNTER — Other Ambulatory Visit: Payer: Self-pay | Admitting: Internal Medicine

## 2021-05-29 MED ORDER — TIRZEPATIDE 5 MG/0.5ML ~~LOC~~ SOAJ: 5.0000 mg | SUBCUTANEOUS | 0 refills | Status: DC

## 2021-06-11 NOTE — Progress Notes (Signed)
FOLLOW UP  Assessment and Plan:   Hypertension Currently well controlled on lisinopril 10 mg QD Continue DASH diet.   Reminder to go to the ER if any CP, SOB, nausea, dizziness, severe HA, changes vision/speech, left arm numbness and tingling and jaw pain.  Cholesterol Currently at goal; continue atorvastatin Continue low cholesterol diet and exercise.  Check lipid panel.   Hx of prediabetes A1Cs significantly improved with weight loss, Continue Mounjaro 5 mg QW Continue diet and exercise.  Perform daily foot/skin check, notify office of any concerning changes.  Check A1C q47m, defer today, check CMP/GFR  Morbid obesity - BMI 37 with prediabetes, htn, hyperlipidemia Long discussion about weight loss, diet, and exercise Discussed final goal weight and current weight loss goal (250 lb) Currently on Mounjaro 5 mg QW and has lost 15 pounds in 4 months  Vitamin D Def At goal at last visit; off of high dose  Recommend supplementation to maintain goal of 60-100 Check Vit D level next OV  Testosterone deficiency Declines supplement; encouraged weight loss, continue zinc 50 mg supplement   Continue diet and meds as discussed. Further disposition pending results of labs. Discussed med's effects and SE's.   Over 30 minutes of exam, counseling, chart review, and critical decision making was performed.   Future Appointments  Date Time Provider [place]  09/11/2021  4:30 PM Unk Pinto, MD [facility] None  03/05/2022 10:00 AM Unk Pinto, MD [facility] None    ----------------------------------------------------------------------------------------------------------------------  HPI 56 y.o. male  presents for 3 month follow up on hypertension, cholesterol, hx of prediabetes, morbid obesity and vitamin D deficiency.   BMI is Body mass index is 37.77 kg/m., he is working on diet and exercise. He is on Mounjaro 5 mg QW and has lost 15 pounds in 4 months. He is  exercising more. Wt Readings from Last 3 Encounters:  06/12/21 263 lb 3.2 oz (119.4 kg)  03/13/21 271 lb 12.8 oz (123.3 kg)  03/05/21 278 lb 6.4 oz (126.3 kg)   His blood pressure has been controlled at home (110-130s/80s), today their BP is BP: 108/72, BP Readings from Last 3 Encounters:  06/12/21 108/72  03/13/21 120/80  03/05/21 138/90    He does not workout but works a physically active job. He denies chest pain, shortness of breath, dizziness.   He is on cholesterol medication (atorvastatin 20 mg daily) and denies myalgias. His cholesterol is not at goal. The cholesterol last visit was:  Lab Results  Component Value Date   CHOL 163 03/05/2021   HDL 42 03/05/2021   LDLCALC 98 03/05/2021   TRIG 129 03/05/2021   CHOLHDL 3.9 03/05/2021    He has been working on diet and exercise for hx of prediabetes, and denies foot ulcerations, increased appetite, nausea, paresthesia of the feet, polydipsia, polyuria, visual disturbances, vomiting and weight loss.  Last A1C in the office was:  Lab Results  Component Value Date   HGBA1C 6.0 (H) 03/05/2021    Last GFR:  Lab Results  Component Value Date   GFRNONAA 77 05/17/2020   Patient is on Vitamin D supplement and above goal at recent check, has cut back from 50000 IU daily to every other day:    Lab Results  Component Value Date   VD25OH 32 03/05/2021     He has testosterone deficiency but no sympotms and declined supplement. Has been taking zinc 50 mg but irregularly. Prescribed sildenafil for ED. Lab Results  Component Value Date   TESTOSTERONE 312  03/05/2021     Current Medications:  Current Outpatient Medications on File Prior to Visit  Medication Sig   atorvastatin (LIPITOR) 20 MG tablet TAKE 1 TABLET BY MOUTH EVERY DAY   Cinnamon 500 MG TABS Take 1 tablet by mouth daily.    citalopram (CELEXA) 40 MG tablet Take  1 tablet  Daily  for Mood   dicyclomine (BENTYL) 20 MG tablet TAKE 1 TABLET 3 X /DAY AS NEEDED FOR NAUSEA,  CRAMPING , BLOATING OR DIARRHEA   famotidine (PEPCID) 20 MG tablet TAKE 1 TABLET 2 X /DAY AS NEED FOR INDIGESTION & HEARTBURN.   ibuprofen (ADVIL) 600 MG tablet Take 600 mg by mouth 3 (three) times daily.   lisinopril (ZESTRIL) 10 MG tablet Take  1 tablet  Daily  for BP / Patient knows to take by mouth   loperamide (IMODIUM A-D) 2 MG tablet Take 1 to 2 tablets after each diarrheal BM  - can take up to # 12 tablets  /24 hours   Multiple Vitamins-Minerals (MULTIVITAMIN WITH MINERALS) tablet Take 1 tablet by mouth daily.   sildenafil (VIAGRA) 100 MG tablet Take      1/2 to 1 tablet  Daily  as needed for  XXXX   tirzepatide [facility]) 5 MG/0.5ML Pen Inject 5 mg into the skin once a week.   zinc gluconate 50 MG tablet Take 50 mg by mouth daily.   No current facility-administered medications on file prior to visit.     Allergies:  Allergies  Allergen Reactions   Bee Venom Anaphylaxis   Chantix [Varenicline]      Medical History:  Past Medical History:  Diagnosis Date   Allergy    Anxiety    Chest pain    Elevated hemoglobin A1c    GERD (gastroesophageal reflux disease)    Hyperlipidemia    Hypertension    Hypogonadism male    Obese    PND (post-nasal drip)    Prediabetes    SOB (shortness of breath) on exertion    Vitamin D deficiency    Family history- Reviewed and unchanged Social history- Reviewed and unchanged   Review of Systems:  Review of Systems  Constitutional:  Negative for malaise/fatigue and weight loss.  HENT:  Negative for hearing loss and tinnitus.   Eyes:  Negative for blurred vision and double vision.  Respiratory:  Negative for cough, shortness of breath and wheezing.   Cardiovascular:  Negative for chest pain, palpitations, orthopnea, claudication and leg swelling.  Gastrointestinal:  Negative for abdominal pain, blood in stool, constipation, diarrhea, heartburn, melena, nausea and vomiting.  Genitourinary: Negative.   Musculoskeletal:  Negative for  back pain, falls, joint pain, myalgias and neck pain.  Skin:  Negative for rash.  Neurological:  Negative for dizziness, tingling, sensory change, weakness and headaches.  Endo/Heme/Allergies:  Negative for polydipsia.  Psychiatric/Behavioral: Negative.    All other systems reviewed and are negative.    Physical Exam: BP 108/72    Pulse 87    Temp 97.7 F (36.5 C)    Wt 263 lb 3.2 oz (119.4 kg)    SpO2 97%    BMI 37.77 kg/m  Wt Readings from Last 3 Encounters:  06/12/21 263 lb 3.2 oz (119.4 kg)  03/13/21 271 lb 12.8 oz (123.3 kg)  03/05/21 278 lb 6.4 oz (126.3 kg)   General Appearance: Well nourished, in no apparent distress. Eyes: PERRLA, EOMs, conjunctiva no swelling or erythema Sinuses: No Frontal/maxillary tenderness ENT/Mouth: Ext aud canals clear, TMs  without erythema, bulging. No erythema, swelling, or exudate on post pharynx.  Tonsils not swollen or erythematous. Hearing normal.  Neck: Supple, thyroid normal.  Respiratory: Respiratory effort normal, BS equal bilaterally without rales, rhonchi, wheezing or stridor.  Cardio: RRR with no MRGs. Brisk peripheral pulses without edema.  Abdomen: Soft, + BS.  Non tender, no guarding, rebound, hernias, masses. Lymphatics: Non tender without lymphadenopathy.  Musculoskeletal: Full ROM, 5/5 strength, Normal gait, Shoulder right: Full range of motion. Neurovascularly intact distally. Good strength with stress of rotator cuff but causes pain. Positive impingement signs. Skin: Warm, dry without rashes, lesions, ecchymosis.  Neuro: Cranial nerves intact. No cerebellar symptoms.  Psych: Awake and oriented X 3, normal affect, Insight and Judgment appropriate.    Magda Bernheim, NP 4:02 PM [facility]

## 2021-06-12 ENCOUNTER — Other Ambulatory Visit: Payer: Self-pay

## 2021-06-12 ENCOUNTER — Ambulatory Visit: Payer: 59 | Admitting: Nurse Practitioner

## 2021-06-12 ENCOUNTER — Encounter: Payer: Self-pay | Admitting: Nurse Practitioner

## 2021-06-12 VITALS — BP 108/72 | HR 87 | Temp 97.7°F | Wt 263.2 lb

## 2021-06-12 DIAGNOSIS — E782 Mixed hyperlipidemia: Secondary | ICD-10-CM

## 2021-06-12 DIAGNOSIS — R7309 Other abnormal glucose: Secondary | ICD-10-CM | POA: Diagnosis not present

## 2021-06-12 DIAGNOSIS — I10 Essential (primary) hypertension: Secondary | ICD-10-CM

## 2021-06-12 DIAGNOSIS — Z79899 Other long term (current) drug therapy: Secondary | ICD-10-CM

## 2021-06-12 DIAGNOSIS — E559 Vitamin D deficiency, unspecified: Secondary | ICD-10-CM | POA: Diagnosis not present

## 2021-06-12 LAB — COMPLETE METABOLIC PANEL WITH GFR
AG Ratio: 1.8 (calc) (ref 1.0–2.5)
ALT: 23 U/L (ref 9–46)
AST: 16 U/L (ref 10–35)
Albumin: 4.2 g/dL (ref 3.6–5.1)
Alkaline phosphatase (APISO): 84 U/L (ref 35–144)
BUN: 21 mg/dL (ref 7–25)
CO2: 27 mmol/L (ref 20–32)
Calcium: 8.8 mg/dL (ref 8.6–10.3)
Chloride: 108 mmol/L (ref 98–110)
Creat: 1.04 mg/dL (ref 0.70–1.30)
Globulin: 2.4 g/dL (calc) (ref 1.9–3.7)
Glucose, Bld: 102 mg/dL — ABNORMAL HIGH (ref 65–99)
Potassium: 4.1 mmol/L (ref 3.5–5.3)
Sodium: 142 mmol/L (ref 135–146)
Total Bilirubin: 0.6 mg/dL (ref 0.2–1.2)
Total Protein: 6.6 g/dL (ref 6.1–8.1)
eGFR: 84 mL/min/{1.73_m2} (ref >=60)

## 2021-06-12 LAB — CBC WITH DIFFERENTIAL/PLATELET
Absolute Monocytes: 828 cells/uL (ref 200–950)
Basophils Absolute: 73 cells/uL (ref 0–200)
Basophils Relative: 0.8 %
Eosinophils Absolute: 273 cells/uL (ref 15–500)
Eosinophils Relative: 3 %
HCT: 39.9 % (ref 38.5–50.0)
Hemoglobin: 13.4 g/dL (ref 13.2–17.1)
Lymphs Abs: 2257 cells/uL (ref 850–3900)
MCH: 30.1 pg (ref 27.0–33.0)
MCHC: 33.6 g/dL (ref 32.0–36.0)
MCV: 89.7 fL (ref 80.0–100.0)
MPV: 12.1 fL (ref 7.5–12.5)
Monocytes Relative: 9.1 %
Neutro Abs: 5669 cells/uL (ref 1500–7800)
Neutrophils Relative %: 62.3 %
Platelets: 224 10*3/uL (ref 140–400)
RBC: 4.45 10*6/uL (ref 4.20–5.80)
RDW: 11.8 % (ref 11.0–15.0)
Total Lymphocyte: 24.8 %
WBC: 9.1 10*3/uL (ref 3.8–10.8)

## 2021-06-12 LAB — LIPID PANEL
Cholesterol: 129 mg/dL (ref <200)
HDL: 35 mg/dL — ABNORMAL LOW (ref >=40)
LDL Cholesterol (Calc): 74 mg/dL (calc)
Non-HDL Cholesterol (Calc): 94 mg/dL (calc) (ref <130)
Total CHOL/HDL Ratio: 3.7 (calc) (ref <5.0)
Triglycerides: 114 mg/dL (ref <150)

## 2021-06-12 LAB — TSH: TSH: 1.67 mIU/L (ref 0.40–4.50)

## 2021-06-12 LAB — VITAMIN D 25 HYDROXY (VIT D DEFICIENCY, FRACTURES): Vit D, 25-Hydroxy: 31 ng/mL (ref 30–100)

## 2021-08-27 ENCOUNTER — Telehealth: Payer: Self-pay | Admitting: Nurse Practitioner

## 2021-08-27 ENCOUNTER — Other Ambulatory Visit: Payer: Self-pay | Admitting: Nurse Practitioner

## 2021-08-27 DIAGNOSIS — E1122 Type 2 diabetes mellitus with diabetic chronic kidney disease: Secondary | ICD-10-CM

## 2021-08-27 MED ORDER — MOUNJARO 7.5 MG/0.5ML ~~LOC~~ SOAJ: 7.5000 mg | SUBCUTANEOUS | 2 refills | Status: DC

## 2021-08-27 NOTE — Telephone Encounter (Signed)
Please send in a prescription for the next dose up of Mounjaro. [facility]. ?

## 2021-09-11 ENCOUNTER — Ambulatory Visit: Payer: 59 | Admitting: Internal Medicine

## 2021-09-18 ENCOUNTER — Encounter: Payer: Self-pay | Admitting: Internal Medicine

## 2021-09-18 NOTE — Patient Instructions (Signed)

## 2021-09-18 NOTE — Progress Notes (Addendum)
? ?Future Appointments  ?Date Time Provider Department  ?09/19/2021  4:00 PM Unk Pinto, MD [facility]  ?03/05/2022              CPE 10:00 AM Unk Pinto, MD [facility]  ?          ? ?History of Present Illness: ? ? ?    This very nice 56 y.o. MWM presents for 6  month follow up with HTN, HLD, Diabetes and Vitamin D Deficiency.  ? ? ?    Patient is treated for HTN  (1999) & BP has been controlled at home. Today?s BP is at goal - 102/62. Patient has had no complaints of any cardiac type chest pain, palpitations, dyspnea / orthopnea / PND, dizziness, claudication, or dependent edema. ? ? ?    Hyperlipidemia is controlled with diet & Atorvastatin . Patient denies myalgias or other med SE?s. Last Lipids were at goal : ? ?Lab Results  ?Component Value Date  ? CHOL 129 06/12/2021  ? HDL 35 (L) 06/12/2021  ? [place] 74 06/12/2021  ? TRIG 114 06/12/2021  ? CHOLHDL 3.7 06/12/2021  ? ? ? 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  Last A1c was not at goal : ? ?Lab Results  ?Component Value Date  ? HGBA1C 6.0 (H) 03/05/2021  ? ?    ? ?                                                   Further, the patient also has history of Vitamin D Deficiency and supplements vitamin D . Last vitamin D was still very low : ? ? ?Lab Results  ?Component Value Date  ? VD25OH 31 06/12/2021  ? ? ? ?Current Outpatient Medications on File Prior to Visit  ?Medication Sig  ? atorvastatin  20 MG tablet TAKE 1 TABLET  EVERY DAY  ? Cinnamon 500 MG TABS Take 1 tablet daily.   ? citalopram  40 MG tablet Take  1 tablet  Daily   ? dicyclomine  20 MG tablet TAKE 1 TABLET 3 X /DAY AS NEEDED   ? famotidine 20 MG t TAKE 1 TABLET 2 X /DAY AS NEEDED  ? ibuprofen 600 MG tablet Take  3  times daily.  ? lisinopril 10 MG tablet Take  1 tablet  Daily  ? loperamide 2 MG  tablet Take 1 to 2 tablets after each diarrheal BM  - can take up to # 12 tablets  /24 hours  ? Multiple Vitamins-Minerals  Take 1 tablet by mouth daily.  ? sildenafil 100 MG tablet Take      1/2 to 1 tablet  Daily  as needed for  XXXX  ? tirzepatide (MOUNJARO) 7.5 MG/0.5ML Pen Inject 7.5 mg into the skin once a week.  ? zinc gluconate 50 MG tablet Take 50 mg by mouth daily.  ? ?                                                                                                                                  ? ? ?  Allergies  ?Allergen Reactions  ? Bee Venom Anaphylaxis  ? Chantix [Varenicline]   ? ? ? ?PMHx:   ?Past Medical History:  ?Diagnosis Date  ? Allergy   ? Anxiety   ? Chest pain   ? Elevated hemoglobin A1c   ? GERD (gastroesophageal reflux disease)   ? Hyperlipidemia   ? Hypertension   ? Hypogonadism male   ? Obese   ? PND (post-nasal drip)   ? Prediabetes   ? SOB (shortness of breath) on exertion   ? Vitamin D deficiency   ? ? ? ?Immunization History  ?Administered Date(s) Administered  ? Influenza Inj Mdck Quad  02/22/2019  ? Influenza Inj Mdck Quad  02/28/2017, 02/14/2020  ? Influenza Split 02/01/2013, 03/11/2014  ? Influenza,inj,Quad  03/05/2021  ? Influenza 12/08/2017  ? PFIZER-SARS-COV-2 Vacc 07/16/2019, 08/09/2019  ? PPD Test 0/07/2016, 12/10/2017, 01/13/2019, 02/14/2020, 03/05/2021  ? Pneumococcal -23 11/05/2012  ? Pneumococcal-23 05/06/1998  ? Td 05/06/2001  ? Tdap 05/05/2012  ? Zoster Recombinat (Shingrix) 09/27/2017, 12/30/2017  ? ? ? ?Past Surgical History:  ?Procedure Laterality Date  ? 63 HOUR PH STUDY    ? COLONOSCOPY    ? LEFT HEART CATHETERIZATION w/CORONARY ANGIOGRAM N/A 02/19/2013  ? LEFT HEART CATHETERIZATION WITH CORONARY ANGIOGRAM;  Josue Hector, MD  ? POLYPECTOMY    ? 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 ? ?FHx:    Reviewed / unchanged ? ?SHx:    Reviewed / unchanged  ? ?Systems Review: ? ?Constitutional: Denies fever, chills, wt changes, headaches, insomnia, fatigue, night  sweats, change in appetite. ?Eyes: Denies redness, blurred vision, diplopia, discharge, itchy, watery eyes.  ?ENT: Denies discharge, congestion, post nasal drip, epistaxis, sore throat, earache, hearing loss, dental pain, tinnitus, vertigo, sinus pain, snoring.  ?CV: Denies chest pain, palpitations, irregular heartbeat, syncope, dyspnea, diaphoresis, orthopnea, PND, claudication or edema. ?Respiratory: denies cough, dyspnea, DOE, pleurisy, hoarseness, laryngitis, wheezing.  ?Gastrointestinal: Denies dysphagia, odynophagia, heartburn, reflux, water brash, abdominal pain or cramps, nausea, vomiting, bloating, diarrhea, constipation, hematemesis, melena, hematochezia  or hemorrhoids. ?Genitourinary: Denies dysuria, frequency, urgency, nocturia, hesitancy, discharge, hematuria or flank pain. ?Musculoskeletal: Denies arthralgias, myalgias, stiffness, jt. swelling, pain, limping or strain/sprain.  ?Skin: Denies pruritus, rash, hives, warts, acne, eczema or change in skin lesion(s). ?Neuro: No weakness, tremor, incoordination, spasms, paresthesia or pain. ?Psychiatric: Denies confusion, memory loss or sensory loss. ?Endo: Denies change in weight, skin or hair change.  ?Heme/Lymph: No excessive bleeding, bruising or enlarged lymph nodes. ? ?Physical Exam ? ?BP 102/62   Pulse 74   Temp 97.7 ?F (36.5 ?C)   Resp 16   Wt 259 lb 3.2 oz (117.6 kg)   SpO2 96%   BMI 37.19 kg/m?  ? ?Appears  well nourished, well groomed  and in no distress. ? ?Eyes: PERRLA, EOMs, conjunctiva no swelling or erythema. ?Sinuses: No frontal/maxillary tenderness ?ENT/Mouth: EAC's clear, TM's nl w/o erythema, bulging. Nares clear w/o erythema, swelling, exudates. Oropharynx clear without erythema or exudates. Oral hygiene is good. Tongue normal, non obstructing. Hearing intact.  ?Neck: Supple. Thyroid not palpable. Car 2+/2+ without bruits, nodes or JVD. ?Chest: Respirations nl with BS clear & equal w/o rales, rhonchi, wheezing or stridor.  ?Cor:  Heart sounds normal w/ regular rate and rhythm without sig. murmurs, gallops, clicks or rubs. Peripheral pulses normal and equal  without edema.  ?Abdomen: Soft & bowel sounds normal. Non-tender w/o guarding, rebound, hernias, masses or organomegaly.  ?Lymphatics: Unremarkable.  ?Musculoskeletal: Full ROM all  peripheral extremities, joint stability, 5/5 strength and normal gait.  ?Skin: Warm, dry without exposed rashes, lesions or ecchymosis apparent.  ?Neuro: Cranial nerves intact, reflexes equal bilaterally. Sensory-motor testing grossly intact. Tendon reflexes grossly intact.  ?Pysch: Alert & oriented x 3.  Insight and judgement nl & appropriate. No ideations. ? ?Assessment and Plan: ? ?1. Essential hypertension ? ?- Continue medication, monitor blood pressure at home.  ?- Continue DASH diet.  Reminder to go to the ER if any CP,  ?SOB, nausea, dizziness, severe HA, changes vision/speech. ?   ?- CBC with Differential/Platelet ?- COMPLETE METABOLIC PANEL WITH GFR ?- Magnesium ?- TSH ? ?2. Hyperlipidemia associated with type 2 diabetes mellitus ([place]) ? ?- Continue diet/meds, exercise,& lifestyle modifications.  ?- Continue monitor periodic cholesterol/liver & renal functions  ?   ? ?- Lipid panel ?- TSH ?- Hemoglobin A1c ? ?3. 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 ?- Continue diet, exercise  ?- Lifestyle modifications.  ?- Monitor appropriate labs ?   ?- Hemoglobin A1c ?- Insulin, random ? ?4. Morbid obesity ([place]) ? ?- Continue supplementation ?   ?- TSH ? ?5. Vitamin D deficiency ? ?- VITAMIN D 25 Hydroxy ? ?6. Medication management ? ?- CBC with Differential/Platelet ?- COMPLETE METABOLIC PANEL WITH GFR ?- Magnesium ?- Lipid panel ?- TSH ?- Hemoglobin A1c ?- VITAMIN D 25 Hydroxy  ? ?      Discussed  regular exercise, BP monitoring, weight control to achieve/maintain BMI less than 25 and discussed med and SE's. Recommended labs to assess  /monitor clinical status .  I discussed the assessment and treatment plan with the patient. The patient was provided an opportunity to ask questions and all were answered. The patient agreed with the plan and demon

## 2021-09-19 ENCOUNTER — Encounter: Payer: Self-pay | Admitting: Internal Medicine

## 2021-09-19 ENCOUNTER — Ambulatory Visit: Payer: 59 | Admitting: Internal Medicine

## 2021-09-19 VITALS — BP 102/62 | HR 74 | Temp 97.7°F | Resp 16 | Ht 70.0 in | Wt 259.2 lb

## 2021-09-19 DIAGNOSIS — I10 Essential (primary) hypertension: Secondary | ICD-10-CM

## 2021-09-19 DIAGNOSIS — E559 Vitamin D deficiency, unspecified: Secondary | ICD-10-CM

## 2021-09-19 DIAGNOSIS — E1122 Type 2 diabetes mellitus with diabetic chronic kidney disease: Secondary | ICD-10-CM | POA: Diagnosis not present

## 2021-09-19 DIAGNOSIS — E785 Hyperlipidemia, unspecified: Secondary | ICD-10-CM

## 2021-09-19 DIAGNOSIS — E1169 Type 2 diabetes mellitus with other specified complication: Secondary | ICD-10-CM

## 2021-09-19 DIAGNOSIS — Z79899 Other long term (current) drug therapy: Secondary | ICD-10-CM | POA: Diagnosis not present

## 2021-09-19 DIAGNOSIS — N182 Chronic kidney disease, stage 2 (mild): Secondary | ICD-10-CM

## 2021-09-19 MED ORDER — TIRZEPATIDE 12.5 MG/0.5ML ~~LOC~~ SOAJ: 12.5000 mg | SUBCUTANEOUS | 1 refills | Status: DC

## 2021-09-19 NOTE — Addendum Note (Signed)
Addended by: Unk Pinto on: 09/19/2021 09:43 PM ? ? Modules accepted: Orders ? ?

## 2021-09-20 LAB — CBC WITH DIFFERENTIAL/PLATELET
Absolute Monocytes: 746 cells/uL (ref 200–950)
Basophils Absolute: 74 cells/uL (ref 0–200)
Basophils Relative: 0.9 %
Eosinophils Absolute: 238 cells/uL (ref 15–500)
Eosinophils Relative: 2.9 %
HCT: 41 % (ref 38.5–50.0)
Hemoglobin: 14.3 g/dL (ref 13.2–17.1)
Lymphs Abs: 2353 cells/uL (ref 850–3900)
MCH: 31.6 pg (ref 27.0–33.0)
MCHC: 34.9 g/dL (ref 32.0–36.0)
MCV: 90.7 fL (ref 80.0–100.0)
MPV: 11.4 fL (ref 7.5–12.5)
Monocytes Relative: 9.1 %
Neutro Abs: 4789 cells/uL (ref 1500–7800)
Neutrophils Relative %: 58.4 %
Platelets: 204 10*3/uL (ref 140–400)
RBC: 4.52 10*6/uL (ref 4.20–5.80)
RDW: 12.6 % (ref 11.0–15.0)
Total Lymphocyte: 28.7 %
WBC: 8.2 10*3/uL (ref 3.8–10.8)

## 2021-09-20 LAB — COMPLETE METABOLIC PANEL WITH GFR
AG Ratio: 1.8 (calc) (ref 1.0–2.5)
ALT: 17 U/L (ref 9–46)
AST: 15 U/L (ref 10–35)
Albumin: 4.4 g/dL (ref 3.6–5.1)
Alkaline phosphatase (APISO): 76 U/L (ref 35–144)
BUN: 20 mg/dL (ref 7–25)
CO2: 29 mmol/L (ref 20–32)
Calcium: 9.3 mg/dL (ref 8.6–10.3)
Chloride: 103 mmol/L (ref 98–110)
Creat: 1.02 mg/dL (ref 0.70–1.30)
Globulin: 2.5 g/dL (calc) (ref 1.9–3.7)
Glucose, Bld: 84 mg/dL (ref 65–99)
Potassium: 4 mmol/L (ref 3.5–5.3)
Sodium: 140 mmol/L (ref 135–146)
Total Bilirubin: 0.8 mg/dL (ref 0.2–1.2)
Total Protein: 6.9 g/dL (ref 6.1–8.1)
eGFR: 86 mL/min/{1.73_m2} (ref >=60)

## 2021-09-20 LAB — LIPID PANEL
Cholesterol: 139 mg/dL (ref <200)
HDL: 36 mg/dL — ABNORMAL LOW (ref >=40)
LDL Cholesterol (Calc): 77 mg/dL (calc)
Non-HDL Cholesterol (Calc): 103 mg/dL (calc) (ref <130)
Total CHOL/HDL Ratio: 3.9 (calc) (ref <5.0)
Triglycerides: 161 mg/dL — ABNORMAL HIGH (ref <150)

## 2021-09-20 LAB — MAGNESIUM: Magnesium: 2.1 mg/dL (ref 1.5–2.5)

## 2021-09-20 LAB — HEMOGLOBIN A1C
Hgb A1c MFr Bld: 5.5 % of total Hgb (ref <5.7)
Mean Plasma Glucose: 111 mg/dL
eAG (mmol/L): 6.2 mmol/L

## 2021-09-20 LAB — INSULIN, RANDOM: Insulin: 40.3 u[IU]/mL — ABNORMAL HIGH

## 2021-09-20 LAB — TSH: TSH: 2.05 mIU/L (ref 0.40–4.50)

## 2021-09-20 LAB — VITAMIN D 25 HYDROXY (VIT D DEFICIENCY, FRACTURES): Vit D, 25-Hydroxy: 37 ng/mL (ref 30–100)

## 2021-09-20 NOTE — Progress Notes (Signed)
<><><><><><><><><><><><><><><><><><><><><><><><><><><><><><><><><> <><><><><><><><><><><><><><><><><><><><><><><><><><><><><><><><><> -   Test results slightly outside the reference range are not unusual. If there is anything important, I will review this with you,  otherwise it is considered normal test values.  If you have further questions,  please do not hesitate to contact me at the office or via My Chart.  <><><><><><><><><><><><><><><><><><><><><><><><><><><><><><><><><> <><><><><><><><><><><><><><><><><><><><><><><><><><><><><><><><><>  -  Total Chol = 139   A LDL Chol = 77    -   Both  Excellent   - Very low risk for Heart Attack  / Stroke <><><><><><><><><><><><><><><><><><><><><><><><><><><><><><><><><>  -  A1c = 5.5% - Back in Normal nonDiabetic Range - Wonderdful  ! !  <><><><><><><><><><><><><><><><><><><><><><><><><><><><><><><><><>  -  Vitamin D = 37 - Is very Low   - Vitamin D goal is between 70-100.   - Please INCREASE  your Vitamin D to 5,000 units / day  - It is very important as a natural anti-inflammatory and helping the  immune system protect against viral infections, like the Covid-19    helping hair, skin, and nails, as well as reducing stroke and  heart attack risk.   - It helps your bones and helps with mood.  - It also decreases numerous cancer risks so please  take it as directed.   - Low Vit D is associated with a 200-300% higher risk for  CANCER   and 200-300% higher risk for HEART   ATTACK  &  STROKE.    - It is also associated with higher death rate at younger ages,   autoimmune diseases like Rheumatoid arthritis, Lupus,  Multiple Sclerosis.     - Also many other serious conditions, like depression, Alzheimer's  Dementia, infertility, muscle aches, fatigue, fibromyalgia   - just to name a few. <><><><><><><><><><><><><><><><><><><><><><><><><><><><><><><><><>  -  All Else - CBC - Kidneys - Electrolytes - Liver - Magnesium & Thyroid     - all  Normal / OK <><><><><><><><><><><><><><><><><><><><><><><><><><><><><><><><><>

## 2021-09-21 ENCOUNTER — Other Ambulatory Visit: Payer: Self-pay | Admitting: Internal Medicine

## 2021-09-21 DIAGNOSIS — K589 Irritable bowel syndrome without diarrhea: Secondary | ICD-10-CM

## 2021-12-09 ENCOUNTER — Other Ambulatory Visit: Payer: Self-pay | Admitting: Internal Medicine

## 2021-12-09 MED ORDER — LISINOPRIL 10 MG PO TABS: ORAL_TABLET | ORAL | 3 refills | Status: DC

## 2022-03-05 ENCOUNTER — Encounter: Payer: 59 | Admitting: Internal Medicine

## 2022-04-02 ENCOUNTER — Other Ambulatory Visit: Payer: Self-pay | Admitting: Internal Medicine

## 2022-04-10 ENCOUNTER — Encounter: Payer: 59 | Admitting: Internal Medicine

## 2022-06-03 ENCOUNTER — Encounter: Payer: Self-pay | Admitting: Internal Medicine

## 2022-06-03 NOTE — Progress Notes (Unsigned)
Future Appointments  Date Time Provider [place]  09/10/2022  3:00 PM Unk Pinto, MD [facility] None    History of Present Illness:       This very nice 57 y.o. MWM presents for belated overdue follow up with HTN, HLD, T2_DM and Vitamin D Deficiency ( last ov May 2023 ! ) . He  also presents with c/o a boil of the Rt great toe.        Patient is treated for HTN  (1999) & BP has been controlled at home. Today's BP is at goal - 124/68. Patient has had no complaints of any cardiac type chest pain, palpitations, dyspnea / orthopnea / PND, dizziness, claudication, or dependent edema.       Hyperlipidemia is controlled with diet & Atorvastatin . Patient denies myalgias or other med SE's. Last Lipids were at goal :  Lab Results  Component Value Date   CHOL 139 09/19/2021   HDL 36 (L) 09/19/2021   LDLCALC 77 09/19/2021   TRIG 161 (H) 09/19/2021   CHOLHDL 3.9 09/19/2021     Also, the patient has  Morbid Obesity (BMI 37.19+) & hx/o prediabetes  (A1c 5.9%/1999) then T2_DM (A1c 6.6%/2017) w/CKD2  (GFR 77)   and has had no symptoms of reactive hypoglycemia, diabetic polys, paresthesias or visual blurring.  Last A1c was not at goal :  Lab Results  Component Value Date   HGBA1C 5.5 09/19/2021   Wt Readings from Last 3 Encounters:     09/19/21 259 lb 3.2 oz (117.6 kg)  06/12/21 263 lb 3.2 oz (119.4 kg)  03/13/21 271 lb 12.8 oz (123.3 kg)   Started [facility] Oct 2022.                                                         Further, the patient also has history of Vitamin D Deficiency and supplements vitamin D . Last vitamin D was still very low :  Lab Results  Component Value Date   VD25OH 37 09/19/2021       Current Outpatient Medications on File Prior to Visit  Medication Sig   atorvastatin  20 MG tablet TAKE 1 TABLET  EVERY DAY   Cinnamon 500 MG TABS Take 1 tablet daily.    citalopram  40 MG tablet Take  1 tablet  Daily    dicyclomine  20 MG tablet TAKE 1 TABLET  3 X /DAY AS NEEDED    famotidine 20 MG t TAKE 1 TABLET 2 X /DAY AS NEEDED   ibuprofen 600 MG tablet Take  3  times daily.   lisinopril 10 MG tablet Take  1 tablet  Daily   loperamide 2 MG tablet Take 1 to 2 tablets after each diarrheal BM  - can take up to # 12 tablets  /24 hours   Multiple Vitamins-Minerals  Take 1 tablet by mouth daily.   sildenafil 100 MG tablet Take      1/2 to 1 tablet  Daily  as needed for  XXXX   tirzepatide [facility]) 7.5 MG/0.5ML Pen Inject 7.5 mg into the skin once a week.   zinc gluconate 50 MG tablet Take 50 mg by mouth daily.  Allergies  Allergen Reactions   Bee Venom Anaphylaxis   Chantix [Varenicline]      PMHx:   Past Medical History:  Diagnosis Date   Allergy    Anxiety    Chest pain    Elevated hemoglobin A1c    GERD (gastroesophageal reflux disease)    Hyperlipidemia    Hypertension    Hypogonadism male    Obese    PND (post-nasal drip)    Prediabetes    SOB (shortness of breath) on exertion    Vitamin D deficiency      Immunization History  Administered Date(s) Administered   Influenza Inj Mdck Quad  02/22/2019   Influenza Inj Mdck Quad  02/28/2017, 02/14/2020   Influenza Split 02/01/2013, 03/11/2014   Influenza,inj,Quad  03/05/2021   Influenza 12/08/2017   PFIZER-SARS-COV-2 Vacc 07/16/2019, 08/09/2019   PPD Test 0/07/2016, 12/10/2017, 01/13/2019, 02/14/2020, 03/05/2021   Pneumococcal -23 11/05/2012   Pneumococcal-23 05/06/1998   Td 05/06/2001   Tdap 05/05/2012   Zoster Recombinat (Shingrix) 09/27/2017, 12/30/2017     Past Surgical History:  Procedure Laterality Date   24 HOUR PH STUDY     COLONOSCOPY     LEFT HEART CATHETERIZATION w/CORONARY ANGIOGRAM N/A 02/19/2013   LEFT HEART CATHETERIZATION WITH CORONARY ANGIOGRAM;  Josue Hector, MD   POLYPECTOMY     SKIN CANCER EXCISION  2004    squamous cell    FHx:    Reviewed / unchanged  SHx:    Reviewed / unchanged   Systems Review:  Constitutional: Denies fever, chills, wt changes, headaches, insomnia, fatigue, night sweats, change in appetite. Eyes: Denies redness, blurred vision, diplopia, discharge, itchy, watery eyes.  ENT: Denies discharge, congestion, post nasal drip, epistaxis, sore throat, earache, hearing loss, dental pain, tinnitus, vertigo, sinus pain, snoring.  CV: Denies chest pain, palpitations, irregular heartbeat, syncope, dyspnea, diaphoresis, orthopnea, PND, claudication or edema. Respiratory: denies cough, dyspnea, DOE, pleurisy, hoarseness, laryngitis, wheezing.  Gastrointestinal: Denies dysphagia, odynophagia, heartburn, reflux, water brash, abdominal pain or cramps, nausea, vomiting, bloating, diarrhea, constipation, hematemesis, melena, hematochezia  or hemorrhoids. Genitourinary: Denies dysuria, frequency, urgency, nocturia, hesitancy, discharge, hematuria or flank pain. Musculoskeletal: Denies arthralgias, myalgias, stiffness, jt. swelling, pain, limping or strain/sprain.  Skin: Denies pruritus, rash, hives, warts, acne, eczema or change in skin lesion(s). Neuro: No weakness, tremor, incoordination, spasms, paresthesia or pain. Psychiatric: Denies confusion, memory loss or sensory loss. Endo: Denies change in weight, skin or hair change.  Heme/Lymph: No excessive bleeding, bruising or enlarged lymph nodes.  Physical Exam  There were no vitals taken for this visit.  Appears  well nourished, well groomed  and in no distress.  Eyes: PERRLA, EOMs, conjunctiva no swelling or erythema. Sinuses: No frontal/maxillary tenderness ENT/Mouth: EAC's clear, TM's nl w/o erythema, bulging. Nares clear w/o erythema, swelling, exudates. Oropharynx clear without erythema or exudates. Oral hygiene is good. Tongue normal, non obstructing. Hearing intact.  Neck: Supple. Thyroid not palpable. Car 2+/2+ without bruits,  nodes or JVD. Chest: Respirations nl with BS clear & equal w/o rales, rhonchi, wheezing or stridor.  Cor: Heart sounds normal w/ regular rate and rhythm without sig. murmurs, gallops, clicks or rubs. Peripheral pulses normal and equal  without edema.  Abdomen: Soft & bowel sounds normal. Non-tender w/o guarding, rebound, hernias, masses or organomegaly.  Lymphatics: Unremarkable.  Musculoskeletal: Full ROM all peripheral extremities, joint stability, 5/5 strength and normal gait.  Skin: Warm, dry without exposed rashes, lesions or ecchymosis apparent.  There is a large pustular bulla over  the Rt Great toe paronychial.   Procedure ( CPT- 10060)         After informed consent  & aseptic prep with Alcohol, H2O2 & Betadine over the large Bulla approx 3 cm x 2 cm was extricated & debrided with irrigation of approx 15 ml of pus.  The wound was then cleaned with H2O2 & Betadine & then packed with Betadine /Sugar compounded poultice and a non-occlusive dressing was applied. Patient was instructed in post-op wound care.   Neuro: Cranial nerves intact, reflexes equal bilaterally. Sensory-motor testing grossly intact. Tendon reflexes grossly intact.  Pysch: Alert & oriented x 3.  Insight and judgement nl & appropriate. No ideations.  Assessment and Plan:  1. Essential hypertension  - Continue medication, monitor blood pressure at home.  - Continue DASH diet.  Reminder to go to the ER if any CP,  SOB, nausea, dizziness, severe HA, changes vision/speech.    - CBC with Differential/Platelet - COMPLETE METABOLIC PANEL WITH GFR - Magnesium - TSH  2. Hyperlipidemia associated with type 2 diabetes mellitus ([place])  - Continue diet/meds, exercise,& lifestyle modifications.  - Continue monitor periodic cholesterol/liver & renal functions      - Lipid panel - TSH - Hemoglobin A1c  3. Type 2 diabetes mellitus with stage 2 chronic kidney  disease, without long-term current use of insulin (H[facility])  -  Continue diet, exercise  - Lifestyle modifications.  - Monitor appropriate labs    - Hemoglobin A1c - Insulin, random  4. Morbid obesity ([place])  - Continue supplementation    - TSH  5. Vitamin D deficiency  - VITAMIN D 25 Hydroxy  6. Medication management  - CBC with Differential/Platelet - COMPLETE METABOLIC PANEL WITH GFR - Magnesium - Lipid panel - TSH - Hemoglobin A1c - VITAMIN D 25 Hydroxy         Discussed  regular exercise, BP monitoring, weight control to achieve/maintain BMI less than 25 and discussed med and SE's. Recommended labs to assess /monitor clinical status .  I discussed the assessment and treatment plan with the patient. The patient was provided an opportunity to ask questions and all were answered. The patient agreed with the plan and demonstrated an understanding of the instructions.  I provided over 30 minutes of exam, counseling, chart review and  complex critical decision making.        The patient was advised to call back or seek an in-person evaluation if the symptoms worsen or if the condition fails to improve as anticipated.   Kirtland Bouchard, MD\

## 2022-06-04 ENCOUNTER — Ambulatory Visit: Payer: 59 | Admitting: Internal Medicine

## 2022-06-04 ENCOUNTER — Encounter: Payer: Self-pay | Admitting: Internal Medicine

## 2022-06-04 VITALS — BP 124/68 | HR 79 | Temp 97.5°F | Resp 16 | Ht 70.0 in | Wt 253.6 lb

## 2022-06-04 DIAGNOSIS — E1122 Type 2 diabetes mellitus with diabetic chronic kidney disease: Secondary | ICD-10-CM

## 2022-06-04 DIAGNOSIS — L02611 Cutaneous abscess of right foot: Secondary | ICD-10-CM | POA: Diagnosis not present

## 2022-06-04 DIAGNOSIS — E559 Vitamin D deficiency, unspecified: Secondary | ICD-10-CM | POA: Diagnosis not present

## 2022-06-04 DIAGNOSIS — Z79899 Other long term (current) drug therapy: Secondary | ICD-10-CM | POA: Diagnosis not present

## 2022-06-04 DIAGNOSIS — I10 Essential (primary) hypertension: Secondary | ICD-10-CM | POA: Diagnosis not present

## 2022-06-04 DIAGNOSIS — E785 Hyperlipidemia, unspecified: Secondary | ICD-10-CM

## 2022-06-04 DIAGNOSIS — N182 Chronic kidney disease, stage 2 (mild): Secondary | ICD-10-CM

## 2022-06-04 DIAGNOSIS — E1169 Type 2 diabetes mellitus with other specified complication: Secondary | ICD-10-CM

## 2022-06-04 MED ORDER — CEPHALEXIN 500 MG PO CAPS: ORAL_CAPSULE | ORAL | 1 refills | Status: DC

## 2022-06-04 MED ORDER — TIRZEPATIDE 15 MG/0.5ML ~~LOC~~ SOAJ: SUBCUTANEOUS | 1 refills | Status: DC

## 2022-06-04 NOTE — Patient Instructions (Signed)

## 2022-06-05 LAB — CBC WITH DIFFERENTIAL/PLATELET
Absolute Monocytes: 747 cells/uL (ref 200–950)
Basophils Absolute: 59 cells/uL (ref 0–200)
Basophils Relative: 0.8 %
Eosinophils Absolute: 163 cells/uL (ref 15–500)
Eosinophils Relative: 2.2 %
HCT: 43.9 % (ref 38.5–50.0)
Hemoglobin: 14.9 g/dL (ref 13.2–17.1)
Lymphs Abs: 2035 cells/uL (ref 850–3900)
MCH: 30.3 pg (ref 27.0–33.0)
MCHC: 33.9 g/dL (ref 32.0–36.0)
MCV: 89.4 fL (ref 80.0–100.0)
MPV: 11.6 fL (ref 7.5–12.5)
Monocytes Relative: 10.1 %
Neutro Abs: 4396 cells/uL (ref 1500–7800)
Neutrophils Relative %: 59.4 %
Platelets: 223 10*3/uL (ref 140–400)
RBC: 4.91 10*6/uL (ref 4.20–5.80)
RDW: 11.9 % (ref 11.0–15.0)
Total Lymphocyte: 27.5 %
WBC: 7.4 10*3/uL (ref 3.8–10.8)

## 2022-06-05 LAB — COMPLETE METABOLIC PANEL WITH GFR
AG Ratio: 1.6 (calc) (ref 1.0–2.5)
ALT: 15 U/L (ref 9–46)
AST: 14 U/L (ref 10–35)
Albumin: 4.4 g/dL (ref 3.6–5.1)
Alkaline phosphatase (APISO): 74 U/L (ref 35–144)
BUN: 17 mg/dL (ref 7–25)
CO2: 27 mmol/L (ref 20–32)
Calcium: 9.5 mg/dL (ref 8.6–10.3)
Chloride: 103 mmol/L (ref 98–110)
Creat: 1.08 mg/dL (ref 0.70–1.30)
Globulin: 2.7 g/dL (calc) (ref 1.9–3.7)
Glucose, Bld: 91 mg/dL (ref 65–99)
Potassium: 4.4 mmol/L (ref 3.5–5.3)
Sodium: 139 mmol/L (ref 135–146)
Total Bilirubin: 0.7 mg/dL (ref 0.2–1.2)
Total Protein: 7.1 g/dL (ref 6.1–8.1)
eGFR: 80 mL/min/{1.73_m2} (ref >=60)

## 2022-06-05 LAB — VITAMIN D 25 HYDROXY (VIT D DEFICIENCY, FRACTURES): Vit D, 25-Hydroxy: 42 ng/mL (ref 30–100)

## 2022-06-05 LAB — LIPID PANEL
Cholesterol: 161 mg/dL (ref <200)
HDL: 46 mg/dL (ref >=40)
LDL Cholesterol (Calc): 94 mg/dL (calc)
Non-HDL Cholesterol (Calc): 115 mg/dL (calc) (ref <130)
Total CHOL/HDL Ratio: 3.5 (calc) (ref <5.0)
Triglycerides: 109 mg/dL (ref <150)

## 2022-06-05 LAB — HEMOGLOBIN A1C
Hgb A1c MFr Bld: 5.7 % of total Hgb — ABNORMAL HIGH (ref <5.7)
Mean Plasma Glucose: 117 mg/dL
eAG (mmol/L): 6.5 mmol/L

## 2022-06-05 LAB — MAGNESIUM: Magnesium: 2.2 mg/dL (ref 1.5–2.5)

## 2022-06-05 LAB — TSH: TSH: 2.4 mIU/L (ref 0.40–4.50)

## 2022-06-05 LAB — INSULIN, RANDOM: Insulin: 18.5 u[IU]/mL — ABNORMAL HIGH

## 2022-06-05 NOTE — Progress Notes (Signed)
<><><><><><><><><><><><><><><><><><><><><><><><><><><><><><><><><> <><><><><><><><><><><><><><><><><><><><><><><><><><><><><><><><><> -   Test results slightly outside the reference range are not unusual. If there is anything important, I will review this with you,  otherwise it is considered normal test values.  If you have further questions,  please do not hesitate to contact me at the office or via My Chart.  <><><><><><><><><><><><><><><><><><><><><><><><><><><><><><><><><> <><><><><><><><><><><><><><><><><><><><><><><><><><><><><><><><><>  -  A1c = 5.7% - sl elevated blood sugars again trending toward pre or early Diabetes   - So  . . .    - Avoid Sweets, Candy & White Stuff   - White Rice, White [place], White Flour  - Breads &  Pasta  & lose weight  ! <><><><><><><><><><><><><><><><><><><><><><><><><><><><><><><><><>  - Total Chol = 161   Excellent - Continue Atorvastatin / Lipitor !  - Very low risk for Heart Attack  / Stroke <><><><><><><><><><><><><><><><><><><><><><><><><><><><><><><><><>  - Vitamin D = 42 - is Very low   - Vitamin D goal is between 70-100.   - Please make sure that you are taking your Vitamin D as directed.   - It is very important as a natural anti-inflammatory and helping the  immune system protect against viral infections, like the Covid-19    helping hair, skin, and nails, as well as reducing stroke and heart attack risk.   - It helps your bones and helps with mood.  - It also decreases numerous cancer risks so please  - take it as directed.   - Low Vit D is associated with a 200-300% higher risk for   CANCER   and 200-300% higher risk for HEART   ATTACK  &  STROKE.    - It is also associated with higher death rate at younger ages,   autoimmune diseases like Rheumatoid arthritis, Lupus,   Multiple Sclerosis.     - Also many other serious conditions, like depression, Alzheimer's  Dementia, infertility, muscle aches, fatigue,  fibromyalgia  <><><><><><><><><><><><><><><><><><><><><><><><><><><><><><><><><>  - Recommend take    5,000 unit capsules  x 2 capsules = 10,000 units /  day  ! <><><><><><><><><><><><><><><><><><><><><><><><><><><><><><><><><>  - All Else - CBC - Kidneys - Electrolytes - Liver - Magnesium & Thyroid    - all  Normal / OK <><><><><><><><><><><><><><><><><><><><><><><><><><><><><><><><><> <><><><><><><><><><><><><><><><><><><><><><><><><><><><><><><><><>

## 2022-06-28 ENCOUNTER — Other Ambulatory Visit: Payer: Self-pay | Admitting: Internal Medicine

## 2022-06-28 DIAGNOSIS — K2100 Gastro-esophageal reflux disease with esophagitis, without bleeding: Secondary | ICD-10-CM

## 2022-09-09 ENCOUNTER — Encounter: Payer: Self-pay | Admitting: Gastroenterology

## 2022-09-10 ENCOUNTER — Encounter: Payer: Self-pay | Admitting: Internal Medicine

## 2022-09-10 ENCOUNTER — Encounter: Payer: 59 | Admitting: Internal Medicine

## 2022-09-10 ENCOUNTER — Ambulatory Visit (INDEPENDENT_AMBULATORY_CARE_PROVIDER_SITE_OTHER): Payer: 59 | Admitting: Internal Medicine

## 2022-09-10 VITALS — BP 110/58 | HR 73 | Temp 97.3°F | Resp 16 | Ht 69.0 in | Wt 258.0 lb

## 2022-09-10 DIAGNOSIS — Z111 Encounter for screening for respiratory tuberculosis: Secondary | ICD-10-CM | POA: Diagnosis not present

## 2022-09-10 DIAGNOSIS — I700 Atherosclerosis of aorta: Secondary | ICD-10-CM

## 2022-09-10 DIAGNOSIS — Z136 Encounter for screening for cardiovascular disorders: Secondary | ICD-10-CM

## 2022-09-10 DIAGNOSIS — Z131 Encounter for screening for diabetes mellitus: Secondary | ICD-10-CM | POA: Diagnosis not present

## 2022-09-10 DIAGNOSIS — Z130 Encounter for screening for diseases of the blood and blood-forming organs and certain disorders involving the immune mechanism: Secondary | ICD-10-CM | POA: Diagnosis not present

## 2022-09-10 DIAGNOSIS — R350 Frequency of micturition: Secondary | ICD-10-CM | POA: Diagnosis not present

## 2022-09-10 DIAGNOSIS — Z1329 Encounter for screening for other suspected endocrine disorder: Secondary | ICD-10-CM | POA: Diagnosis not present

## 2022-09-10 DIAGNOSIS — E291 Testicular hypofunction: Secondary | ICD-10-CM

## 2022-09-10 DIAGNOSIS — Z1389 Encounter for screening for other disorder: Secondary | ICD-10-CM | POA: Diagnosis not present

## 2022-09-10 DIAGNOSIS — Z79899 Other long term (current) drug therapy: Secondary | ICD-10-CM | POA: Diagnosis not present

## 2022-09-10 DIAGNOSIS — Z8249 Family history of ischemic heart disease and other diseases of the circulatory system: Secondary | ICD-10-CM

## 2022-09-10 DIAGNOSIS — E559 Vitamin D deficiency, unspecified: Secondary | ICD-10-CM | POA: Diagnosis not present

## 2022-09-10 DIAGNOSIS — Z13220 Encounter for screening for lipoid disorders: Secondary | ICD-10-CM | POA: Diagnosis not present

## 2022-09-10 DIAGNOSIS — E66812 Obesity, class 2: Secondary | ICD-10-CM

## 2022-09-10 DIAGNOSIS — I10 Essential (primary) hypertension: Secondary | ICD-10-CM | POA: Diagnosis not present

## 2022-09-10 DIAGNOSIS — Z1211 Encounter for screening for malignant neoplasm of colon: Secondary | ICD-10-CM

## 2022-09-10 DIAGNOSIS — Z0000 Encounter for general adult medical examination without abnormal findings: Secondary | ICD-10-CM

## 2022-09-10 DIAGNOSIS — Z0001 Encounter for general adult medical examination with abnormal findings: Secondary | ICD-10-CM

## 2022-09-10 DIAGNOSIS — Z125 Encounter for screening for malignant neoplasm of prostate: Secondary | ICD-10-CM

## 2022-09-10 DIAGNOSIS — N401 Enlarged prostate with lower urinary tract symptoms: Secondary | ICD-10-CM

## 2022-09-10 DIAGNOSIS — E1122 Type 2 diabetes mellitus with diabetic chronic kidney disease: Secondary | ICD-10-CM

## 2022-09-10 DIAGNOSIS — E1169 Type 2 diabetes mellitus with other specified complication: Secondary | ICD-10-CM

## 2022-09-10 DIAGNOSIS — Z87891 Personal history of nicotine dependence: Secondary | ICD-10-CM

## 2022-09-10 DIAGNOSIS — R5383 Other fatigue: Secondary | ICD-10-CM

## 2022-09-10 LAB — CBC WITH DIFFERENTIAL/PLATELET
Eosinophils Absolute: 225 cells/uL (ref 15–500)
MCHC: 34.9 g/dL (ref 32.0–36.0)
Neutrophils Relative %: 54.1 %
Platelets: 225 10*3/uL (ref 140–400)
RBC: 4.91 10*6/uL (ref 4.20–5.80)

## 2022-09-10 MED ORDER — TIRZEPATIDE 15 MG/0.5ML ~~LOC~~ SOAJ: SUBCUTANEOUS | 1 refills | Status: DC

## 2022-09-10 MED ORDER — TOPIRAMATE 50 MG PO TABS
ORAL_TABLET | ORAL | 1 refills | Status: DC
Start: 2022-09-10 — End: 2023-03-13

## 2022-09-10 MED ORDER — PHENTERMINE HCL 37.5 MG PO TABS
ORAL_TABLET | ORAL | 1 refills | Status: DC
Start: 2022-09-10 — End: 2023-01-16

## 2022-09-10 NOTE — Patient Instructions (Signed)
Due to recent changes in healthcare laws, you may see the results of your imaging and laboratory studies on MyChart before your provider has had a chance to review them.  We understand that in some cases there may be results that are confusing or concerning to you. Not all laboratory results come back in the same time frame and the provider may be waiting for multiple results in order to interpret others.  Please give us 48 hours in order for your provider to thoroughly review all the results before contacting the office for clarification of your results.   ++++++++++++++++++++++++++++++++++++++  Vit D  & Vit C 1,000 mg   are recommended to help protect  against the Covid-19 and other Corona viruses.    Also it's recommended  to take  Zinc 50 mg  to help  protect against the Covid-19   and best place to get  is also on [employer]  and don't pay more than 6-8 cents /pill !  =============================== Coronavirus (COVID-19) Are you at risk?  Are you at risk for the Coronavirus (COVID-19)?  To be considered HIGH RISK for Coronavirus (COVID-19), you have to meet the following criteria:  Traveled to [place], [place], [place], [place] or [place]; or in the [place] to [place], [place], [place]  or [place]; and have fever, cough, and shortness of breath within the last 2 weeks of travel OR Been in close contact with a person diagnosed with COVID-19 within the last 2 weeks and have  fever, cough,and shortness of breath  IF YOU DO NOT MEET THESE CRITERIA, YOU ARE CONSIDERED LOW RISK FOR COVID-19.  What to do if you are HIGH RISK for COVID-19?  If you are having a medical emergency, call 911. Seek medical care right away. Before you go to a doctor's office, urgent care or emergency department,  call ahead and tell them about your recent travel, contact with someone diagnosed with COVID-19   and your symptoms.  You should receive instructions from your physician's office  regarding next steps of care.  When you arrive at healthcare provider, tell the healthcare staff immediately you have returned from  visiting [place], [place], [place], [place] or [place]; or traveled in the [place] to [place], [place],  [place] or [place] in the last two weeks or you have been in close contact with a person diagnosed with  COVID-19 in the last 2 weeks.   Tell the health care staff about your symptoms: fever, cough and shortness of breath. After you have been seen by a medical provider, you will be either: Tested for (COVID-19) and discharged home on quarantine except to seek medical care if  symptoms worsen, and asked to  Stay home and avoid contact with others until you get your results (4-5 days)  Avoid travel on public transportation if possible (such as bus, train, or airplane) or Sent to the Emergency Department by EMS for evaluation, COVID-19 testing  and  possible admission depending on your condition and test results.  What to do if you are LOW RISK for COVID-19?  Reduce your risk of any infection by using the same precautions used for avoiding the common cold or flu:  Wash your hands often with soap and warm water for at least 20 seconds.  If soap and water are not readily available,  use an alcohol-based hand sanitizer with at least 60% alcohol.  If coughing or sneezing, cover your mouth and nose by coughing   or sneezing into the elbow areas of your shirt or coat,  into a tissue or into your sleeve (not your hands). Avoid shaking hands with others and consider head nods or verbal greetings only. Avoid touching your eyes, nose, or mouth with unwashed hands.  Avoid close contact with people who are sick. Avoid places or events with large numbers of people in one location, like concerts or sporting events. Carefully consider travel plans you have or are making. If you are planning any travel outside or inside the [place], visit the CDC's Travelers' Health  webpage for the latest health notices. If you have some symptoms but not all symptoms, continue to monitor at home and seek medical attention  if your symptoms worsen. If you are having a medical emergency, call 911. >>>>>>>>>>>>>>>>>>>>>>>>>>>> Preventive Care for Adults  A healthy lifestyle and preventive care can promote health and wellness. Preventive health guidelines for men include the following key practices: A routine yearly physical is a good way to check with your health care provider about your health and preventative screening. It is a chance to share any concerns and updates on your health and to receive a thorough exam. Visit your dentist for a routine exam and preventative care every 6 months. Brush your teeth twice a day and floss once a day. Good oral hygiene prevents tooth decay and gum disease. The frequency of eye exams is based on your age, health, family medical history, use of contact lenses, and other factors. Follow your health care provider's recommendations for frequency of eye exams. Eat a healthy diet. Foods such as vegetables, fruits, whole grains, low-fat dairy products, and lean protein foods contain the nutrients you need without too many calories. Decrease your intake of foods high in solid fats, added sugars, and salt. Eat the right amount of calories for you. Get information about a proper diet from your health care provider, if necessary. Regular physical exercise is one of the most important things you can do for your health. Most adults should get at least 150 minutes of moderate-intensity exercise (any activity that increases your heart rate and causes you to sweat) each week. In addition, most adults need muscle-strengthening exercises on 2 or more days a week. Maintain a healthy weight. The body mass index (BMI) is a screening tool to identify possible weight problems. It provides an estimate of body fat based on height and weight. Your health care provider can  find your BMI and can help you achieve or maintain a healthy weight. For adults 20 years and older: A BMI below 18.5 is considered underweight. A BMI of 18.5 to 24.9 is normal. A BMI of 25 to 29.9 is considered overweight. A BMI of 30 and above is considered obese. Maintain normal blood lipids and cholesterol levels by exercising and minimizing your intake of saturated fat. Eat a balanced diet with plenty of fruit and vegetables. Blood tests for lipids and cholesterol should begin at age 20 and be repeated every 5 years. If your lipid or cholesterol levels are high, you are over 50, or you are at high risk for heart disease, you may need your cholesterol levels checked more frequently. Ongoing high lipid and cholesterol levels should be treated with medicines if diet and exercise are not working. If you smoke, find out from your health care provider how to quit. If you do not use tobacco, do not start. Lung cancer screening is recommended for adults aged 55-80 years who are at high risk for   developing lung cancer because of a history of smoking. A yearly low-dose CT scan of the lungs is recommended for people who have at least a 30-pack-year history of smoking and are a current smoker or have quit within the past 15 years. A pack year of smoking is smoking an average of 1 pack of cigarettes a day for 1 year (for example: 1 pack a day for 30 years or 2 packs a day for 15 years). Yearly screening should continue until the smoker has stopped smoking for at least 15 years. Yearly screening should be stopped for people who develop a health problem that would prevent them from having lung cancer treatment. If you choose to drink alcohol, do not have more than 2 drinks per day. One drink is considered to be 12 ounces (355 mL) of beer, 5 ounces (148 mL) of wine, or 1.5 ounces (44 mL) of liquor. Avoid use of street drugs. Do not share needles with anyone. Ask for help if you need support or instructions about  stopping the use of drugs. High blood pressure causes heart disease and increases the risk of stroke. Your blood pressure should be checked at least every 1-2 years. Ongoing high blood pressure should be treated with medicines, if weight loss and exercise are not effective. If you are 45-79 years old, ask your health care provider if you should take aspirin to prevent heart disease. Diabetes screening involves taking a blood sample to check your fasting blood sugar level. This should be done once every 3 years, after age 45, if you are within normal weight and without risk factors for diabetes. Testing should be considered at a younger age or be carried out more frequently if you are overweight and have at least 1 risk factor for diabetes. Colorectal cancer can be detected and often prevented. Most routine colorectal cancer screening begins at the age of 50 and continues through age 75. However, your health care provider may recommend screening at an earlier age if you have risk factors for colon cancer. On a yearly basis, your health care provider may provide home test kits to check for hidden blood in the stool. Use of a small camera at the end of a tube to directly examine the colon (sigmoidoscopy or colonoscopy) can detect the earliest forms of colorectal cancer. Talk to your health care provider about this at age 50, when routine screening begins. Direct exam of the colon should be repeated every 5-10 years through age 75, unless early forms of precancerous polyps or small growths are found.  Talk with your health care provider about prostate cancer screening. Testicular cancer screening isrecommended for adult males. Screening includes self-exam, a health care provider exam, and other screening tests. Consult with your health care provider about any symptoms you have or any concerns you have about testicular cancer. Use sunscreen. Apply sunscreen liberally and repeatedly throughout the day. You should  seek shade when your shadow is shorter than you. Protect yourself by wearing long sleeves, pants, a wide-brimmed hat, and sunglasses year round, whenever you are outdoors. Once a month, do a whole-body skin exam, using a mirror to look at the skin on your back. Tell your health care provider about new moles, moles that have irregular borders, moles that are larger than a pencil eraser, or moles that have changed in shape or color. Stay current with required vaccines (immunizations). Influenza vaccine. All adults should be immunized every year. Tetanus, diphtheria, and acellular pertussis (Td, Tdap) vaccine. An   adult who has not previously received Tdap or who does not know his vaccine status should receive 1 dose of Tdap. This initial dose should be followed by tetanus and diphtheria toxoids (Td) booster doses every 10 years. Adults with an unknown or incomplete history of completing a 3-dose immunization series with Td-containing vaccines should begin or complete a primary immunization series including a Tdap dose. Adults should receive a Td booster every 10 years. Varicella vaccine. An adult without evidence of immunity to varicella should receive 2 doses or a second dose if he has previously received 1 dose. Human papillomavirus (HPV) vaccine. Males aged 13-21 years who have not received the vaccine previously should receive the 3-dose series. Males aged 22-26 years may be immunized. Immunization is recommended through the age of 26 years for any male who has sex with males and did not get any or all doses earlier. Immunization is recommended for any person with an immunocompromised condition through the age of 26 years if he did not get any or all doses earlier. During the 3-dose series, the second dose should be obtained 4-8 weeks after the first dose. The third dose should be obtained 24 weeks after the first dose and 16 weeks after the second dose. Zoster vaccine. One dose is recommended for adults  aged 60 years or older unless certain conditions are present.  PREVNAR  - Pneumococcal 13-valent conjugate (PCV13) vaccine. When indicated, a person who is uncertain of his immunization history and has no record of immunization should receive the PCV13 vaccine. An adult aged 19 years or older who has certain medical conditions and has not been previously immunized should receive 1 dose of PCV13 vaccine. This PCV13 should be followed with a dose of pneumococcal polysaccharide (PPSV23) vaccine. The PPSV23 vaccine dose should be obtained at least 1 r more year(s) after the dose of PCV13 vaccine. An adult aged 19 years or older who has certain medical conditions and previously received 1 or more doses of PPSV23 vaccine should receive 1 dose of PCV13. The PCV13 vaccine dose should be obtained 1 or more years after the last PPSV23 vaccine dose.  PNEUMOVAX - Pneumococcal polysaccharide (PPSV23) vaccine. When PCV13 is also indicated, PCV13 should be obtained first. All adults aged 65 years and older should be immunized. An adult younger than age 65 years who has certain medical conditions should be immunized. Any person who resides in a nursing home or long-term care facility should be immunized. An adult smoker should be immunized. People with an immunocompromised condition and certain other conditions should receive both PCV13 and PPSV23 vaccines. People with human immunodeficiency virus (HIV) infection should be immunized as soon as possible after diagnosis. Immunization during chemotherapy or radiation therapy should be avoided. Routine use of PPSV23 vaccine is not recommended for American Indians, [place] Natives, or people younger than 65 years unless there are medical conditions that require PPSV23 vaccine. When indicated, people who have unknown immunization and have no record of immunization should receive PPSV23 vaccine. One-time revaccination 5 years after the first dose of PPSV23 is recommended for people  aged 19-64 years who have chronic kidney failure, nephrotic syndrome, asplenia, or immunocompromised conditions. People who received 1-2 doses of PPSV23 before age 65 years should receive another dose of PPSV23 vaccine at age 65 years or later if at least 5 years have passed since the previous dose. Doses of PPSV23 are not needed for people immunized with PPSV23 at or after age 65 years.  Hepatitis A vaccine.   Adults who wish to be protected from this disease, have certain high-risk conditions, work with hepatitis A-infected animals, work in hepatitis A research labs, or travel to or work in countries with a high rate of hepatitis A should be immunized. Adults who were previously unvaccinated and who anticipate close contact with an international adoptee during the first 60 days after arrival in the [place] from a country with a high rate of hepatitis A should be immunized.  Hepatitis B vaccine. Adults should be immunized if they wish to be protected from this disease, have certain high-risk conditions, may be exposed to blood or other infectious body fluids, are household contacts or sex partners of hepatitis B positive people, are clients or workers in certain care facilities, or travel to or work in countries with a high rate of hepatitis B.  Preventive Service / Frequency  Ages 40 to 64 Blood pressure check. Lipid and cholesterol check Lung cancer screening. / Every year if you are aged 55-80 years and have a 30-pack-year history of smoking and currently smoke or have quit within the past 15 years. Yearly screening is stopped once you have quit smoking for at least 15 years or develop a health problem that would prevent you from having lung cancer treatment. Fecal occult blood test (FOBT) of stool. / Every year beginning at age 50 and continuing until age 75. You may not have to do this test if you get a colonoscopy every 10 years. Flexible sigmoidoscopy** or colonoscopy.** / Every 5 years for  a flexible sigmoidoscopy or every 10 years for a colonoscopy beginning at age 50 and continuing until age 75. Screening for abdominal aortic aneurysm (AAA)  by ultrasound is recommended for people who have history of high blood pressure or who are current or former smokers. +++++++++++ Recommend Adult Low Dose Aspirin or  coated  Aspirin 81 mg daily  To reduce risk of Colon Cancer 40 %,  Skin Cancer 26 % ,  Malignant Melanoma 46%  and  Pancreatic cancer 60% ++++++++++++++++++++ Vitamin D goal  is between 70-100.  Please make sure that you are taking your Vitamin D as directed.  It is very important as a natural anti-inflammatory  helping hair, skin, and nails, as well as reducing stroke and heart attack risk.  It helps your bones and helps with mood. It also decreases numerous cancer risks so please take it as directed.  Low Vit D is associated with a 200-300% higher risk for CANCER  and 200-300% higher risk for HEART   ATTACK  &  STROKE.   ...................................... It is also associated with higher death rate at younger ages,  autoimmune diseases like Rheumatoid arthritis, Lupus, Multiple Sclerosis.    Also many other serious conditions, like depression, Alzheimer's Dementia, infertility, muscle aches, fatigue, fibromyalgia - just to name a few. +++++++++++++++++++++ Recommend the book "The END of DIETING" by Dr Joel Fuhrman  & the book "The END of DIABETES " by Dr Joel Fuhrman At [employer] - get book & Audio CD's    Being diabetic has a  300% increased risk for heart attack, stroke, cancer, and alzheimer- type vascular dementia. It is very important that you work harder with diet by avoiding all foods that are white. Avoid white rice (brown & wild rice is OK), white potatoes (sweetpotatoes in moderation is OK), White bread or wheat bread or anything made out of white flour like bagels, donuts, rolls, buns, biscuits, cakes, pastries, cookies, pizza crust, and pasta (made    from white flour & egg whites) - vegetarian pasta or spinach or wheat pasta is OK. Multigrain breads like Arnold's or Pepper[employer], or multigrain sandwich thins or flatbreads.  Diet, exercise and weight loss can reverse and cure diabetes in the early stages.  Diet, exercise and weight loss is very important in the control and prevention of complications of diabetes which affects every system in your body, ie. Brain - dementia/stroke, eyes - glaucoma/blindness, heart - heart attack/heart failure, kidneys - dialysis, stomach - gastric paralysis, intestines - malabsorption, nerves - severe painful neuritis, circulation - gangrene & loss of a leg(s), and finally cancer and Alzheimers.    I recommend avoid fried & greasy foods,  sweets/candy, white rice (brown or wild rice or Quinoa is OK), white potatoes (sweet potatoes are OK) - anything made from white flour - bagels, doughnuts, rolls, buns, biscuits,white and wheat breads, pizza crust and traditional pasta made of white flour & egg white(vegetarian pasta or spinach or wheat pasta is OK).  Multi-grain bread is OK - like multi-grain flat bread or sandwich thins. Avoid alcohol in excess. Exercise is also important.    Eat all the vegetables you want - avoid meat, especially red meat and dairy - especially cheese.  Cheese is the most concentrated form of trans-fats which is the worst thing to clog up our arteries. Veggie cheese is OK which can be found in the fresh produce section at [employer] or [employer] or [employer]  ++++++++++++++++++++++ DASH Eating Plan  DASH stands for "Dietary Approaches to Stop Hypertension."   The DASH eating plan is a healthy eating plan that has been shown to reduce high blood pressure (hypertension). Additional health benefits may include reducing the risk of type 2 diabetes mellitus, heart disease, and stroke. The DASH eating plan may also help with weight loss. WHAT DO I NEED TO KNOW ABOUT THE DASH EATING PLAN? For  the DASH eating plan, you will follow these general guidelines: Choose foods with a percent daily value for sodium of less than 5% (as listed on the food label). Use salt-free seasonings or herbs instead of table salt or sea salt. Check with your health care provider or pharmacist before using salt substitutes. Eat lower-sodium products, often labeled as "lower sodium" or "no salt added." Eat fresh foods. Eat more vegetables, fruits, and low-fat dairy products. Choose whole grains. Look for the word "whole" as the first word in the ingredient list. Choose fish  Limit sweets, desserts, sugars, and sugary drinks. Choose heart-healthy fats. Eat veggie cheese  Eat more home-cooked food and less restaurant, buffet, and fast food. Limit fried foods. Cook foods using methods other than frying. Limit canned vegetables. If you do use them, rinse them well to decrease the sodium. When eating at a restaurant, ask that your food be prepared with less salt, or no salt if possible.                      WHAT FOODS CAN I EAT? Read Dr Joel Fuhrman's books on The End of Dieting & The End of Diabetes  Grains Whole grain or whole wheat bread. Brown rice. Whole grain or whole wheat pasta. Quinoa, bulgur, and whole grain cereals. Low-sodium cereals. Corn or whole wheat flour tortillas. Whole grain cornbread. Whole grain crackers. Low-sodium crackers.  Vegetables Fresh or frozen vegetables (raw, steamed, roasted, or grilled). Low-sodium or reduced-sodium tomato and vegetable juices. Low-sodium or reduced-sodium tomato sauce and paste. Low-sodium or reduced-sodium canned vegetables.     Fruits All fresh, canned (in natural juice), or frozen fruits.  Protein Products  All fish and seafood.  Dried beans, peas, or lentils. Unsalted nuts and seeds. Unsalted canned beans.  Dairy Low-fat dairy products, such as skim or 1% milk, 2% or reduced-fat cheeses, low-fat ricotta or cottage cheese, or plain low-fat yogurt.  Low-sodium or reduced-sodium cheeses.  Fats and Oils Tub margarines without trans fats. Light or reduced-fat mayonnaise and salad dressings (reduced sodium). Avocado. Safflower, olive, or canola oils. Natural peanut or almond butter.  Other Unsalted popcorn and pretzels. The items listed above may not be a complete list of recommended foods or beverages. Contact your dietitian for more options.  +++++++++++++++++++  WHAT FOODS ARE NOT RECOMMENDED? Grains/ White flour or wheat flour White bread. White pasta. White rice. Refined cornbread. Bagels and croissants. Crackers that contain trans fat.  Vegetables  Creamed or fried vegetables. Vegetables in a . Regular canned vegetables. Regular canned tomato sauce and paste. Regular tomato and vegetable juices.  Fruits Dried fruits. Canned fruit in light or heavy syrup. Fruit juice.  Meat and Other Protein Products Meat in general - RED meat & White meat.  Fatty cuts of meat. Ribs, chicken wings, all processed meats as bacon, sausage, bologna, salami, fatback, hot dogs, bratwurst and packaged luncheon meats.  Dairy Whole or 2% milk, cream, half-and-half, and cream cheese. Whole-fat or sweetened yogurt. Full-fat cheeses or blue cheese. Non-dairy creamers and whipped toppings. Processed cheese, cheese spreads, or cheese curds.  Condiments Onion and garlic salt, seasoned salt, table salt, and sea salt. Canned and packaged gravies. Worcestershire sauce. Tartar sauce. Barbecue sauce. Teriyaki sauce. Soy sauce, including reduced sodium. Steak sauce. Fish sauce. Oyster sauce. Cocktail sauce. Horseradish. Ketchup and mustard. Meat flavorings and tenderizers. Bouillon cubes. Hot sauce. Tabasco sauce. Marinades. Taco seasonings. Relishes.  Fats and Oils Butter, stick margarine, lard, shortening and bacon fat. Coconut, palm kernel, or palm oils. Regular salad dressings.  Pickles and olives. Salted popcorn and pretzels.  The items listed above may not  be a complete list of foods and beverages to avoid.   

## 2022-09-10 NOTE — Progress Notes (Signed)
Annual  Screening/Preventative Visit  & Comprehensive Evaluation & Examination   Future Appointments  Date Time Provider Department  09/10/2022  3:00 PM Lucky Cowboy, MD [facility]         This very nice 57 y.o. MWM presents for a Screening /Preventative Visit & comprehensive evaluation and management of multiple medical co-morbidities.  Patient has been followed for HTN, HLD, T2_NIDDM  and Vitamin D Deficiency.       HTN predates since  1999. Patient's BP has been controlled and today's BP is at goal - 138/90. Patient denies any cardiac symptoms as chest pain, palpitations, shortness of breath, dizziness or ankle swelling.       Patient's hyperlipidemia is controlled with diet and Atorvastatin. Patient denies myalgias or other medication SE's. Last lipids were  at goal:   Lab Results  Component Value Date   CHOL 161 06/04/2022   HDL 46 06/04/2022   LDLCALC 94 06/04/2022   TRIG 109 06/04/2022   CHOLHDL 3.5 06/04/2022          Patient has Morbid Obesity  (BMI 38.3+) & hx/o prediabetes  (A1c 5.9%/1999) then T2_DM (A1c 6.6%/2017)  w/CKD2  (GFR 77)  and is attempting control by diet.  In 2018, he lost from 290# down 50# to 240# and currently is back up to 258 #  ( BMI 38.10 ).   Patient denies reactive hypoglycemic symptoms, visual blurring, diabetic polys or paresthesias. Last A1c was not at goal :    Lab Results  Component Value Date   HGBA1C 6.6 (H) 10/19/2015    Lab Results  Component Value Date   HGBA1C 5.7 (H) 06/04/2022                                              Patient has hx/o low Testosterone & has declined replacement therapy.         Finally, patient has history of Vitamin D Deficiency ("16" /2008) and last vitamin D was sl low (goal 70-100) :   Lab Results  Component Value Date   VD25OH 42 06/04/2022       Current Outpatient Medications on File Prior to Visit  Medication Sig   atorvastatin 20 MG tablet TAKE 1 TABLET EVERY DAY   Cinnamon  500 MG TABS Take  daily.    citalopram 40 MG tablet Take  1 tablet  Daily  for Mood   dicyclomine 20 MG tablet TAKE 1 TABLET 3 X /DAY AS NEEDED FOR NAUSEA, CRAMPING , BLOATING OR DIARRHEA   famotidine  20 MG tablet TAKE 1 TABLET 2 X /DAY AS NEEDED   ibuprofen 600 MG tablet Take 3  times daily.   lisinopril 10 MG tablet Take  1 tablet  Daily  for BP    Multi-Vit -Minerals  Take 1 tablet  daily.   phentermine 37.5 MG tablet TAKE 1 TABLET EVERY MORNING    sildenafil  100 MG tablet Take 1/2 to 1 tablet  Daily  as needed for  XXXX   topiramate 50 MG tablet TAKE 1/2 TO 1 TABLET 2 X /DAY    zinc  50 MG tablet Take  daily.    Allergies  Allergen Reactions   Bee Venom Anaphylaxis   Chantix [Varenicline]      Past Medical History:  Diagnosis Date   Allergy    Anxiety  Chest pain    Elevated hemoglobin A1c    GERD     Hyperlipidemia    Hypertension    Hypogonadism male    Obese    PND (post-nasal drip)    Prediabetes    SOB  on exertion    Vitamin D deficiency      Health Maintenance  Topic Date Due   Hepatitis C Screening  Never done   Pneumococcal Vaccine 15-20 Years old (2 - PCV) 11/05/2013   COVID-19 Vaccine (3 - Pfizer risk series) 09/06/2019   INFLUENZA VACCINE  12/04/2020   TETANUS/TDAP  05/05/2022   COLONOSCOPY  11/29/2022   Zoster Vaccines- Shingrix  Completed   HPV VACCINES  Aged Out   HIV Screening  Discontinued     Immunization History  Administered Date(s) Administered   Influenza Inj Mdck Quad   02/22/2019   Influenza Inj Mdck Quad   02/28/2017, 02/14/2020   Influenza  12/08/2017   PFIZER SARS-COV-2 Vacc  07/16/2019, 08/09/2019   PPD Test 12/10/2017, 01/13/2019, 02/14/2020   Pneumococcal  -23 11/05/2012   Pneumococcal -23 05/06/1998   Td 05/06/2001   Tdap 05/05/2012   Zoster Recombinat (Shingrix) 09/27/2017, 12/30/2017    Colon - 10/19/2014 Tubular adenoma - Dr Arlyce Dice  and  then  Last  colon on  11/29/2019 - Dr Myrtie Neither - Recc 3 year f/u Colon     Past Surgical History:  Procedure Laterality Date   33 HOUR PH STUDY     COLONOSCOPY     LEFT HEART CATHETERIZATION WITH CORONARY ANGIOGRAM N/A 02/19/2013   Procedure: LEFT HEART CATHETERIZATION WITH CORONARY ANGIOGRAM;  Surgeon: Wendall Stade, MD;  Location: [facility] CATH LAB;  Service: Cardiovascular;  Laterality: N/A;   POLYPECTOMY     SKIN CANCER EXCISION  2004   squamous cell     Family History  Problem Relation Age of Onset   Hypertension Mother    COPD Mother    Diabetes Sister    Cancer Father        Lung cancer   Colon cancer Neg Hx    Esophageal cancer Neg Hx    Rectal cancer Neg Hx    Stomach cancer Neg Hx      Social History   Tobacco Use   Smoking status: Former    Types: Cigarettes    Quit date: 05/01/2012    Years since quitting: 8.8   Smokeless tobacco: Never  Substance Use Topics   Alcohol use: Yes    Alcohol/week: 0.0 standard drinks    Comment: very little   Drug use: No      ROS Constitutional: Denies fever, chills, weight loss/gain, headaches, insomnia,  night sweats or change in appetite. Does c/o fatigue. Eyes: Denies redness, blurred vision, diplopia, discharge, itchy or watery eyes.  ENT: Denies discharge, congestion, post nasal drip, epistaxis, sore throat, earache, hearing loss, dental pain, Tinnitus, Vertigo, Sinus pain or snoring.  Cardio: Denies chest pain, palpitations, irregular heartbeat, syncope, dyspnea, diaphoresis, orthopnea, PND, claudication or edema Respiratory: denies cough, dyspnea, DOE, pleurisy, hoarseness, laryngitis or wheezing.  Gastrointestinal: Denies dysphagia, heartburn, reflux, water brash, pain, cramps, nausea, vomiting, bloating, diarrhea, constipation, hematemesis, melena, hematochezia, jaundice or hemorrhoids Genitourinary: Denies dysuria, frequency, discharge, hematuria or flank pain. Has urgency, nocturia x 2-3 & occasional hesitancy. Musculoskeletal: Denies arthralgia, myalgia, stiffness, Jt. Swelling, pain,  limp or strain/sprain. Denies Falls. Skin: Denies puritis, rash, hives, warts, acne, eczema or change in skin lesion Neuro: No weakness, tremor, incoordination, spasms, paresthesia or  pain Psychiatric: Denies confusion, memory loss or sensory loss. Denies Depression. Endocrine: Denies change in weight, skin, hair change, nocturia, and paresthesia, diabetic polys, visual blurring or hyper / hypo glycemic episodes.  Heme/Lymph: No excessive bleeding, bruising or enlarged lymph nodes.   Physical Exam  BP (!) 110/58   Pulse 73   Temp (!) 97.3 F (36.3 C)   Resp 16   Ht 5\' 9"  (1.753 m)   Wt 258 lb (117 kg)   SpO2 96%   BMI 38.10 kg/m   General Appearance: Well nourished and well groomed and in no apparent distress.  Eyes: PERRLA, EOMs, conjunctiva no swelling or erythema, normal fundi and vessels. Sinuses: No frontal/maxillary tenderness ENT/Mouth: EACs patent / TMs  nl. Nares clear without erythema, swelling, mucoid exudates. Oral hygiene is good. No erythema, swelling, or exudate. Tongue normal, non-obstructing. Tonsils not swollen or erythematous. Hearing normal.  Neck: Supple, thyroid not palpable. No bruits, nodes or JVD. Respiratory: Respiratory effort normal.  BS equal and clear bilateral without rales, rhonci, wheezing or stridor. Cardio: Heart sounds are normal with regular rate and rhythm and no murmurs, rubs or gallops. Peripheral pulses are normal and equal bilaterally without edema. No aortic or femoral bruits. Chest: symmetric with normal excursions and percussion.  Abdomen: Soft, with Nl bowel sounds. Nontender, no guarding, rebound, hernias, masses, or organomegaly.  Lymphatics: Non tender without lymphadenopathy.  Musculoskeletal: Full ROM all peripheral extremities, joint stability, 5/5 strength, and normal gait. Skin: Warm and dry without rashes, lesions, cyanosis, clubbing or  ecchymosis.  Neuro: Cranial nerves intact, reflexes equal bilaterally. Normal muscle tone,  no cerebellar symptoms. Sensation intact.  Pysch: Alert and oriented X 3 with normal affect, insight and judgment appropriate.   Assessment and Plan  1. Annual Preventative/Screening Exam   2. Essential hypertension  - EKG 12-Lead - [place], RETROPERITNL ABD,  LTD - Urinalysis, Routine w reflex microscopic - Microalbumin / creatinine urine ratio - CBC with Differential/Platelet - COMPLETE METABOLIC PANEL WITH GFR - Magnesium - TSH  3. Hyperlipidemia associated with type 2 diabetes mellitus (HCC)  - EKG 12-Lead - [place], RETROPERITNL ABD,  LTD - Lipid panel - TSH  4. Type 2 diabetes mellitus with stage 2 chronic kidney disease, without long-term current use of insulin (HCC)  - EKG 12-Lead - [place], RETROPERITNL ABD,  LTD - Urinalysis, Routine w reflex microscopic - Microalbumin / creatinine urine ratio - HM DIABETES FOOT EXAM - PR LOW EXTEMITY NEUR EXAM DOCUM - Insulin, random  - tirzepatide (MOUNJARO) 15 MG/0.5ML Pen; Inject 1 pen (15 mg) into Skin every 7 days for Diabetes  ( Dx: e11.29)  Dispense: 6 mL; Refill: 1  5. Testosterone Deficiency  - Testosterone  6. Vitamin D deficiency  - VITAMIN D 25 Hydroxy (Vit-D Deficiency, Fractures)  7. BPH with obstruction/lower urinary tract symptoms  - PSA  8. Prostate cancer screening  - PSA  9. Screening for heart disease  - EKG 12-Lead  10. Screening examination for pulmonary tuberculosis  - TB Skin Test  11. Screening for colorectal cancer  - POC Hemoccult Bld/Stl (3-Cd Home Screen); Future  12. FH: hypertension  - EKG 12-Lead - [place], RETROPERITNL ABD,  LTD  13. Former smoker  - EKG 12-Lead - [place], RETROPERITNL ABD,  LTD  14. Screening for AAA (aortic abdominal aneurysm)  - [place], RETROPERITNL ABD,  LTD  15. Fatigue, unspecified type  - Iron, Total/Total Iron Binding Cap - Vitamin B12 - CBC with Differential/Platelet - TSH  16. Medication  management  - Urinalysis, Routine w reflex microscopic -  Microalbumin / creatinine urine ratio - Testosterone - CBC with Differential/Platelet - COMPLETE METABOLIC PANEL WITH GFR - Magnesium - Lipid panel - TSH - Hemoglobin A1c - Insulin, random - VITAMIN D 25 Hydroxy (Vit-D Deficiency, Fractures)  17. Class 2 severe obesity with serious comorbidity and body mass index (BMI) of 37.0 to 37.9 in adult, unspecified obesity type (HCC)  - phentermine (ADIPEX-P) 37.5 MG tablet; Take 1/2 to 1 tablet every Morning for Dieting & Weight Loss  Dispense: 90 tablet; Refill: 1 - topiramate (TOPAMAX) 50 MG tablet; Take 1/2 to 1 tablet 2 x /day at Suppertime & Bedtime for Dieting & Weight Loss  Dispense: 180 tablet; Refill: 1            Patient was counseled in prudent diet, weight control to achieve/maintain BMI less than 25, BP monitoring, regular exercise and medications as discussed.  Discussed med effects and SE's. Routine screening labs and tests as requested with regular follow-up as recommended. Over 40 minutes of exam, counseling, chart review and high complex critical decision making was performed   Marinus Maw, MD

## 2022-09-11 ENCOUNTER — Other Ambulatory Visit: Payer: Self-pay | Admitting: Internal Medicine

## 2022-09-11 DIAGNOSIS — E1169 Type 2 diabetes mellitus with other specified complication: Secondary | ICD-10-CM

## 2022-09-11 LAB — COMPLETE METABOLIC PANEL WITH GFR
AG Ratio: 1.9 (calc) (ref 1.0–2.5)
ALT: 15 U/L (ref 9–46)
AST: 16 U/L (ref 10–35)
Albumin: 4.6 g/dL (ref 3.6–5.1)
Alkaline phosphatase (APISO): 71 U/L (ref 35–144)
BUN: 12 mg/dL (ref 7–25)
CO2: 27 mmol/L (ref 20–32)
Calcium: 9.6 mg/dL (ref 8.6–10.3)
Chloride: 103 mmol/L (ref 98–110)
Creat: 1.02 mg/dL (ref 0.70–1.30)
Globulin: 2.4 g/dL (calc) (ref 1.9–3.7)
Glucose, Bld: 77 mg/dL (ref 65–99)
Potassium: 4.1 mmol/L (ref 3.5–5.3)
Sodium: 139 mmol/L (ref 135–146)
Total Bilirubin: 0.5 mg/dL (ref 0.2–1.2)
Total Protein: 7 g/dL (ref 6.1–8.1)
eGFR: 85 mL/min/{1.73_m2} (ref >=60)

## 2022-09-11 LAB — CBC WITH DIFFERENTIAL/PLATELET
Absolute Monocytes: 972 cells/uL — ABNORMAL HIGH (ref 200–950)
Basophils Absolute: 81 cells/uL (ref 0–200)
Basophils Relative: 0.9 %
Eosinophils Relative: 2.5 %
HCT: 43.9 % (ref 38.5–50.0)
Hemoglobin: 15.3 g/dL (ref 13.2–17.1)
Lymphs Abs: 2853 cells/uL (ref 850–3900)
MCH: 31.2 pg (ref 27.0–33.0)
MCV: 89.4 fL (ref 80.0–100.0)
MPV: 11.8 fL (ref 7.5–12.5)
Monocytes Relative: 10.8 %
Neutro Abs: 4869 cells/uL (ref 1500–7800)
RDW: 12.1 % (ref 11.0–15.0)
Total Lymphocyte: 31.7 %
WBC: 9 10*3/uL (ref 3.8–10.8)

## 2022-09-11 LAB — HEMOGLOBIN A1C
Hgb A1c MFr Bld: 6 % of total Hgb — ABNORMAL HIGH (ref <5.7)
Mean Plasma Glucose: 126 mg/dL
eAG (mmol/L): 7 mmol/L

## 2022-09-11 LAB — URINALYSIS, ROUTINE W REFLEX MICROSCOPIC
Bilirubin Urine: NEGATIVE
Glucose, UA: NEGATIVE
Hgb urine dipstick: NEGATIVE
Ketones, ur: NEGATIVE
Leukocytes,Ua: NEGATIVE
Nitrite: NEGATIVE
Protein, ur: NEGATIVE
Specific Gravity, Urine: 1.019 (ref 1.001–1.035)
pH: 5.5 (ref 5.0–8.0)

## 2022-09-11 LAB — LIPID PANEL
Cholesterol: 189 mg/dL (ref <200)
HDL: 44 mg/dL (ref >=40)
LDL Cholesterol (Calc): 115 mg/dL (calc) — ABNORMAL HIGH
Non-HDL Cholesterol (Calc): 145 mg/dL (calc) — ABNORMAL HIGH (ref <130)
Total CHOL/HDL Ratio: 4.3 (calc) (ref <5.0)
Triglycerides: 188 mg/dL — ABNORMAL HIGH (ref <150)

## 2022-09-11 LAB — IRON, TOTAL/TOTAL IRON BINDING CAP
%SAT: 30 % (calc) (ref 20–48)
Iron: 89 ug/dL (ref 50–180)
TIBC: 301 mcg/dL (calc) (ref 250–425)

## 2022-09-11 LAB — MAGNESIUM: Magnesium: 2.2 mg/dL (ref 1.5–2.5)

## 2022-09-11 LAB — TESTOSTERONE: Testosterone: 198 ng/dL — ABNORMAL LOW (ref 250–827)

## 2022-09-11 LAB — INSULIN, RANDOM: Insulin: 46.5 u[IU]/mL — ABNORMAL HIGH

## 2022-09-11 LAB — VITAMIN D 25 HYDROXY (VIT D DEFICIENCY, FRACTURES): Vit D, 25-Hydroxy: 39 ng/mL (ref 30–100)

## 2022-09-11 LAB — PSA: PSA: 0.23 ng/mL (ref <=4.00)

## 2022-09-11 LAB — VITAMIN B12: Vitamin B-12: 337 pg/mL (ref 200–1100)

## 2022-09-11 LAB — MICROALBUMIN / CREATININE URINE RATIO
Creatinine, Urine: 165 mg/dL (ref 20–320)
Microalb Creat Ratio: 1 mg/g creat (ref <30)
Microalb, Ur: 0.2 mg/dL

## 2022-09-11 LAB — TSH: TSH: 2.99 mIU/L (ref 0.40–4.50)

## 2022-09-11 MED ORDER — ROSUVASTATIN CALCIUM 20 MG PO TABS
ORAL_TABLET | ORAL | 3 refills | Status: DC
Start: 2022-09-11 — End: 2023-01-16

## 2022-09-11 NOTE — Progress Notes (Signed)
^<^<^<^<^<^<^<^<^<^<^<^<^<^<^<^<^<^<^<^<^<^<^<^<^<^<^<^<^<^<^<^<^<^<^<^<^ ^>^>^>^>^>^>^>^>^>^>^>>^>^>^>^>^>^>^>^>^>^>^>^>^>^>^>^>^>^>^>^>^>^>^>^>^>  -Test results slightly outside the reference range are not unusual. If there is anything important, I will review this with you,  otherwise it is considered normal test values.  If you have further questions,  please do not hesitate to contact me at the office or via My Chart.   ^<^<^<^<^<^<^<^<^<^<^<^<^<^<^<^<^<^<^<^<^<^<^<^<^<^<^<^<^<^<^<^<^<^<^<^<^ ^>^>^>^>^>^>^>^>^>^>^>^>^>^>^>^>^>^>^>^>^>^>^>^>^>^>^>^>^>^>^>^>^>^>^>^>^  -  Testosterone is slightly low - So be sure taking your Zinc to help raise testosterone  levels naturally                                         ^<^<^<^<^<^<^<^<^<^<^<^<^<^<^<^<^<^<^<^<^<^<^<^<^<^<^<^<^<^<^<^<^<^<^<^<^ ^>^>^>^>^>^>^>^>^>^>^>^>^>^>^>^>^>^>^>^>^>^>^>^>^>^>^>^>^>^>^>^>^>^>^>^>^  -   - Total Chol = 189  is elevated   risk for Heart Attack /Stroke /Vascular Dementia     ( Ideal or Goal is less than 180 ! )  & - Bad /Dangerous LDL Chol =  115     - - >> is very high Risk       ( Ideal or Goal is less than 70 ! )   - The cause is Bad Diet !  - But will send in new Rx to change your Chol meds to Rosuvastatin ( Crestor)   - Read or listen to   Dr Glenard Haring 's book    " How Not to Die ! "   ^<^<^<^<^<^<^<^<^<^<^<^<^<^<^<^<^<^<^<^<^<^<^<^<^<^<^<^<^<^<^<^<^<^<^<^<^ ^>^>^>^>^>^>^>^>^>^>^>^>^>^>^>^>^>^>^>^>^>^>^>^>^>^>^>^>^>^>^>^>^>^>^>^>^  - Diet still very important  - Cholesterol is too high - Recommend low cholesterol diet   - Cholesterol only comes from animal sources                                                                                                 - ie. meat, dairy, egg yolks  - Eat all the vegetables you want.  - Avoid Meat, Avoid Meat,  Avoid Meat                                                                            - especially Red Meat - Beef  AND Pork .  - Avoid cheese & dairy - milk & ice cream.     - Cheese is the most concentrated form of trans-fats which                                                                           is the worst thing to clog up our arteries.   - Veggie cheese is OK which can be found in the fresh  produce section at [facility]) or [employer] or [employer]  - Recommend a stricter plant based low cholesterol diet   - Cholesterol only comes from animal sources                                                                 - ie. meat, dairy, egg yolks  - Eat all the vegetables you want.  - Avoid Meat, Avoid Meat , Avoid Meat  ! ! !                                                  -especially red meat - Beef AND Pork  - Avoid cheese & dairy - milk & ice cream.   - Cheese is the most concentrated form of trans-fats which                                                    is the worst thing to clog up our arteries.  ^<^<^<^<^<^<^<^<^<^<^<^<^<^<^<^<^<^<^<^<^<^<^<^<^<^<^<^<^<^<^<^<^<^<^<^<^ ^>^>^>^>^>^>^>^>^>^>^>^>^>^>^>^>^>^>^>^>^>^>^>^>^>^>^>^>^>^>^>^>^>^>^>^>^  -  A1c up to 6.0% again  . Your blood sugar and A1c are elevated.    Being diabetic has a  300% increased risk for heart attack,  stroke, cancer, and alzheimer- type vascular dementia.   It is very important that you work harder with diet by  avoiding all foods that are white except chicken,   fish & calliflower.  - Avoid white rice  (brown & wild rice is OK),   - Avoid white potatoes  (sweet potatoes in moderation is OK),   White bread or wheat bread or anything made out of   white flour like bagels, donuts, rolls, buns, biscuits, cakes,  - pastries, cookies, pizza crust, and pasta (made from  white flour & egg whites)   - vegetarian pasta or spinach or wheat pasta is OK.  - Multigrain breads like Arnold's, Pepperidge Farm or   multigrain sandwich thins or high fiber breads  like   E[place]ka bread or "Dave's Killer" breads that are  4 to 5 grams fiber per slice !  are best.    Diet, exercise and weight loss can reverse and cure  diabetes in the early stages.    - Diet, exercise and weight loss is very important in the   control and prevention of complications of diabetes which  affects every system in your body, ie.   -Brain - dementia/stroke,  - eyes - glaucoma/blindness,  - heart - heart attack/heart failure,  - kidneys - dialysis,  - stomach - gastric paralysis,  - intestines - malabsorption,  - nerves - severe painful neuritis,  - circulation - gangrene & loss of a leg(s)  - and finally  . . . . . . . . . . . . . . . . . .    - cancer and Alzheimers.  ^<^<^<^<^<^<^<^<^<^<^<^<^<^<^<^<^<^<^<^<^<^<^<^<^<^<^<^<^<^<^<^<^<^<^<^<^ ^>^>^>^>^>^>^>^>^>^>^>^>^>^>^>^>^>^>^>^>^>^>^>^>^>^>^>^>^>^>^>^>^>^>^>^>^  -  Also Vitamin B12 =   337  is    Very Low  (Ideal or Goal Vit  B12 is between 450 - 1,100)   Low Vit B12 may be associated with Anemia , Fatigue,                                        Peripheral Neuropathy, Dementia, "Brain Fog", & Depression  - Recommend take a sub-lingual form of Vitamin B12 tablet   1,000 to 5,000 mcg tab that you dissolve under your tongue /Daily   - Can get Lavonia Dana - best price at [employer] or on [employer]  ^<^<^<^<^<^<^<^<^<^<^<^<^<^<^<^<^<^<^<^<^<^<^<^<^<^<^<^<^<^<^<^<^<^<^<^<^ ^>^>^>^>^>^>^>^>^>^>^>^>^>^>^>^>^>^>^>^>^>^>^>^>^>^>^>^>^>^>^>^>^>^>^>^>^  -  PSA - very low - Great - No Prostate cancer   ^<^<^<^<^<^<^<^<^<^<^<^<^<^<^<^<^<^<^<^<^<^<^<^<^<^<^<^<^<^<^<^<^<^<^<^<^ ^>^>^>^>^>^>^>^>^>^>^>^>^>^>^>^>^>^>^>^>^>^>^>^>^>^>^>^>^>^>^>^>^>^>^>^>^  -  Vitamin D = 39 - very low  - Vitamin D goal is between 70-100.   - Please make sure that you are taking your Vitamin D as directed.   - It is very important as a natural anti-inflammatory and helping the  immune system protect against viral infections, like the  Covid-19    helping hair, skin, and nails, as well as reducing stroke and  heart attack risk.   - It helps your bones and helps with mood.  - It also decreases numerous cancer risks so please  take it as directed.   - Low Vit D is associated with a 200-300% higher risk for  CANCER   and 200-300% higher risk for HEART   ATTACK  &  STROKE.    - It is also associated with higher death rate at younger ages,   autoimmune diseases like Rheumatoid arthritis, Lupus,  Multiple Sclerosis.     - Also many other serious conditions, like depression, Alzheimer's  Dementia, infertility, muscle aches, fatigue, fibromyalgia   ^<^<^<^<^<^<^<^<^<^<^<^<^<^<^<^<^<^<^<^<^<^<^<^<^<^<^<^<^<^<^<^<^<^<^<^<^ ^>^>^>^>^>^>^>^>^>^>^>^>^>^>^>^>^>^>^>^>^>^>^>^>^>^>^>^>^>^>^>^>^>^>^>^>^  -  Vitamin D = 39  - is very low   - Vitamin D goal is between 70-100.   - Please take  Vitamin D 5,000 units / day   - It is very important as a natural anti-inflammatory and helping the  immune system protect against viral infections, like the Covid-19    helping hair, skin, and nails, as well as reducing stroke and heart attack risk.   - It helps your bones and helps with mood.  - It also decreases numerous cancer risks so please                                                                                           take it as directed.   - Low Vit D is associated with a 200-300% higher risk for CANCER   and 200-300% higher risk for HEART   ATTACK  &  STROKE.    - It is also associated with higher death rate at younger ages,   autoimmune diseases like Rheumatoid arthritis, Lupus, Multiple Sclerosis.     - Also many other serious conditions, like depression, Alzheimer'sDementia,   - muscle aches, fatigue, fibromyalgia   ^<^<^<^<^<^<^<^<^<^<^<^<^<^<^<^<^<^<^<^<^<^<^<^<^<^<^<^<^<^<^<^<^<^<^<^<^ ^>^>^>^>^>^>^>^>^>^>^>^>^>^>^>^>^>^>^>^>^>^>^>^>^>^>^>^>^>^>^>^>^>^>^>^>^  -  All Else - CBC - Kidneys -  Electrolytes - Liver - Magnesium & Thyroid    - all  Normal /  OK ^<^<^<^<^<^<^<^<^<^<^<^<^<^<^<^<^<^<^<^<^<^<^<^<^<^<^<^<^<^<^<^<^<^<^<^<^ ^>^>^>^>^>^>^>^>^>^>^>^>^>^>^>^>^>^>^>^>^>^>^>^>^>^>^>^>^>^>^>^>^>^>^>^>^  -

## 2022-11-06 ENCOUNTER — Encounter: Payer: Self-pay | Admitting: Gastroenterology

## 2022-11-26 ENCOUNTER — Telehealth: Payer: Self-pay

## 2022-11-26 NOTE — Telephone Encounter (Signed)
Prior auth approved through 11/26/23.    Prior auth completed and submitted.

## 2022-11-29 ENCOUNTER — Other Ambulatory Visit: Payer: Self-pay | Admitting: Internal Medicine

## 2022-12-16 ENCOUNTER — Other Ambulatory Visit: Payer: Self-pay | Admitting: Internal Medicine

## 2022-12-30 ENCOUNTER — Ambulatory Visit (AMBULATORY_SURGERY_CENTER): Payer: 59

## 2022-12-30 VITALS — Ht 70.0 in | Wt 260.0 lb

## 2022-12-30 DIAGNOSIS — Z86010 Personal history of colonic polyps: Secondary | ICD-10-CM

## 2022-12-30 MED ORDER — NA SULFATE-K SULFATE-MG SULF 17.5-3.13-1.6 GM/177ML PO SOLN
1.0000 | Freq: Once | ORAL | 0 refills | Status: AC
Start: 2022-12-30 — End: 2022-12-30

## 2022-12-30 NOTE — Progress Notes (Signed)
No egg or soy allergy known to patient  No issues known to pt with past sedation with any surgeries or procedures Patient denies ever being told they had issues or difficulty with intubation  No FH of Malignant Hyperthermia Pt is not on diet pills - phentermine Hold 10 days Pt is not on  home 02  Pt is not on blood thinners  Pt denies issues with constipation  No A fib or A flutter Have any cardiac testing pending--no Pt can ambulate independently Pt denies use of chewing tobacco Discussed diabetic I weight loss medication holds Discussed NSAID holds Checked BMI Pt instructed to use [employer] or GoodRx for a price reduction on prep  Patient's chart reviewed by Cathlyn Parsons CNRA prior to previsit and patient appropriate for the [facility].  Pre visit completed and red dot placed by patient's name on their procedure day (on provider's schedule).

## 2022-12-30 NOTE — Progress Notes (Unsigned)
FOLLOW UP  Assessment and Plan:   Hypertension Currently well controlled on lisinopril 10 mg QD Continue DASH diet.   Reminder to go to the ER if any CP, SOB, nausea, dizziness, severe HA, changes vision/speech, left arm numbness and tingling and jaw pain. - CBC - CMP  Hyperlipidemia due to Type 2 diabetes mellitus(HCC) Currently at goal; continue atorvastatin Continue low cholesterol diet and exercise.  Check lipid panel.   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 Will restart at [facility] 10 mg SQ QW Continue diet and exercise.  Perform daily foot/skin check, notify office of any concerning changes.  Check A1C q50m,   Morbid obesity - BMI 37 with type 2 diabetes, htn, hyperlipidemia Long discussion about weight loss, diet, and exercise Discussed final goal weight and current weight loss goal (250 lb)  Vitamin D Def At goal at last visit; off of high dose  Recommend supplementation to maintain goal of 60-100 Check Vit D level next OV  Testosterone deficiency Declines supplement; encouraged weight loss, continue zinc 50 mg supplement  Medication Management - Magnesium  Continue diet and meds as discussed. Further disposition pending results of labs. Discussed med's effects and SE's.   Over 30 minutes of exam, counseling, chart review, and critical decision making was performed.   Future Appointments  Date Time Provider Department Center  01/16/2023  8:00 AM Danis, Andreas Blower, MD [facility]  04/02/2023  9:30 AM Lucky Cowboy, MD [facility] None  07/03/2023  9:30 AM Raynelle Dick, NP [facility] None  10/01/2023 10:00 AM Lucky Cowboy, MD [facility] None    ----------------------------------------------------------------------------------------------------------------------  HPI 57 y.o. male  presents for 3 month follow up on hypertension, cholesterol, type 2 diabetes mellitus, morbid obesity and  vitamin D deficiency.   Colonoscopy is scheduled for 01/16/23.   BMI is Body mass index is 37.13 kg/m., he is working on diet and exercise. He has been exercising but not as much.  He continues on Phentermine and Topamax  Wt Readings from Last 3 Encounters:  12/31/22 258 lb 12.8 oz (117.4 kg)  12/30/22 260 lb (117.9 kg)  09/10/22 258 lb (117 kg)   His blood pressure has been controlled at home (110-130s/80s) on lisinopril 10 mg QD, today their BP is BP: 110/72, BP Readings from Last 3 Encounters:  12/31/22 110/72  09/10/22 (!) 110/58  06/04/22 124/68  He does not workout but works a physically active job. He denies chest pain, shortness of breath, dizziness.   He is on cholesterol medication (atorvastatin 20 mg daily) and denies myalgias. His cholesterol is not at goal. The cholesterol last visit was:  Lab Results  Component Value Date   CHOL 189 09/10/2022   HDL 44 09/10/2022   LDLCALC 115 (H) 09/10/2022   TRIG 188 (H) 09/10/2022   CHOLHDL 4.3 09/10/2022    He has been working on diet and exercise for Type 2 DM, and denies foot ulcerations, increased appetite, nausea, paresthesia of the feet, polydipsia, polyuria, visual disturbances, vomiting and weight loss.  Last A1C in the office was:  Lab Results  Component Value Date   HGBA1C 6.0 (H) 09/10/2022    He is trying to drink more water with crystal light. Last GFR:  Lab Results  Component Value Date   EGFR 85 09/10/2022    Patient is on Vitamin D supplement and above goal at recent check, has cut back from 50000 IU daily to  every other day:    Lab Results  Component Value Date   VD25OH 39 09/10/2022     He has testosterone deficiency but no sympotms and declined supplement. Has been taking zinc 50 mg but irregularly. Prescribed sildenafil for ED. Lab Results  Component Value Date   TESTOSTERONE 198 (L) 09/10/2022     Current Medications:  Current Outpatient Medications on File Prior to Visit  Medication Sig    Cholecalciferol (VITAMIN D) 125 MCG (5000 UT) CAPS Take by mouth.   citalopram (CELEXA) 40 MG tablet TAKE 1 TABLET BY MOUTH EVERY DAY FOR MOOD   dicyclomine (BENTYL) 20 MG tablet TAKE 1 TABLET BY MOUTH THREE TIMES A DAY AS NEEDED FOR NAUSEA/CRAMPING BLOATING OR DIARRHEA   famotidine (PEPCID) 20 MG tablet TAKE 1 TABLET BY MOUTH TWICE A DAY AS NEEDED FOR INDIGESTION & HEARTBURN   ibuprofen (ADVIL) 600 MG tablet Take 600 mg by mouth 3 (three) times daily.   lisinopril (ZESTRIL) 10 MG tablet Take  1 tablet  daily for BP & Kidney Protection                                     /                 TAKE                              BY                    MOUTH   loperamide (IMODIUM A-D) 2 MG tablet Take 1 to 2 tablets after each diarrheal BM  - can take up to # 12 tablets  /24 hours   Multiple Vitamins-Minerals (MULTIVITAMIN WITH MINERALS) tablet Take 1 tablet by mouth daily.   phentermine (ADIPEX-P) 37.5 MG tablet Take 1/2 to 1 tablet every Morning for Dieting & Weight Loss   rosuvastatin (CRESTOR) 20 MG tablet Take  1 tablet  Daily  for Cholesterol   sildenafil (VIAGRA) 100 MG tablet Take 1/2 to 1 tablet   Daily as needed for XXXX                                                                      /                                                                   TAKE                                         BY  MOUTH     TAKE 1/2 TO 1 TABLET BY MOUTH DAILY AS NEEDED FOR XXXX   topiramate (TOPAMAX) 50 MG tablet Take 1/2 to 1 tablet 2 x /day at Suppertime & Bedtime for Dieting & Weight Loss   zinc gluconate 50 MG tablet Take 50 mg by mouth daily.   Cinnamon 500 MG TABS Take 1 tablet by mouth daily.  (Patient not taking: Reported on 12/30/2022)   tirzepatide [facility]) 15 MG/0.5ML Pen Inject 1 pen (15 mg) into Skin every 7 days for Diabetes  ( Dx: e11.29) (Patient not taking: Reported on 12/30/2022)   No current facility-administered medications on file prior to  visit.     Allergies:  Allergies  Allergen Reactions   Bee Venom Anaphylaxis   Chantix [Varenicline]      Medical History:  Past Medical History:  Diagnosis Date   Allergy    Anxiety    Chest pain    Elevated hemoglobin A1c    GERD (gastroesophageal reflux disease)    Hyperlipidemia    Hypertension    Hypogonadism male    Obese    PND (post-nasal drip)    Prediabetes    SOB (shortness of breath) on exertion    Vitamin D deficiency    Family history- Reviewed and unchanged Social history- Reviewed and unchanged   Review of Systems:  Review of Systems  Constitutional:  Negative for malaise/fatigue and weight loss.  HENT:  Negative for hearing loss and tinnitus.   Eyes:  Negative for blurred vision and double vision.  Respiratory:  Negative for cough, shortness of breath and wheezing.   Cardiovascular:  Negative for chest pain, palpitations, orthopnea, claudication and leg swelling.  Gastrointestinal:  Negative for abdominal pain, blood in stool, constipation, diarrhea, heartburn, melena, nausea and vomiting.  Genitourinary: Negative.   Musculoskeletal:  Negative for back pain, falls, joint pain, myalgias and neck pain.  Skin:  Negative for rash.  Neurological:  Negative for dizziness, tingling, sensory change, weakness and headaches.  Endo/Heme/Allergies:  Negative for polydipsia.  Psychiatric/Behavioral: Negative.    All other systems reviewed and are negative.     Physical Exam: BP 110/72   Pulse 77   Temp (!) 97.3 F (36.3 C)   Ht 5\' 10"  (1.778 m)   Wt 258 lb 12.8 oz (117.4 kg)   SpO2 98%   BMI 37.13 kg/m  Wt Readings from Last 3 Encounters:  12/31/22 258 lb 12.8 oz (117.4 kg)  12/30/22 260 lb (117.9 kg)  09/10/22 258 lb (117 kg)   General Appearance: Pleasant obese male, in no apparent distress. Eyes: PERRLA, EOMs, conjunctiva no swelling or erythema Sinuses: No Frontal/maxillary tenderness ENT/Mouth: Ext aud canals clear, TMs without erythema,  bulging. No erythema, swelling, or exudate on post pharynx. Hearing normal.  Neck: Supple, thyroid normal.  Respiratory: Respiratory effort normal, BS equal bilaterally without rales, rhonchi, wheezing or stridor.  Cardio: RRR with no MRGs. Brisk peripheral pulses without edema.  Abdomen: Soft, + BS.  Non tender, no guarding, rebound, hernias, masses. Lymphatics: Non tender without lymphadenopathy.  Musculoskeletal: Full ROM, 5/5 strength, Normal gait,  Skin: Warm, dry without rashes, lesions, ecchymosis.  Neuro: Cranial nerves intact. No cerebellar symptoms.  Psych: Awake and oriented X 3, normal affect, Insight and Judgment appropriate.    Raynelle Dick, NP 9:58 AM [facility]

## 2022-12-31 ENCOUNTER — Ambulatory Visit: Payer: 59 | Admitting: Nurse Practitioner

## 2022-12-31 ENCOUNTER — Encounter: Payer: Self-pay | Admitting: Nurse Practitioner

## 2022-12-31 VITALS — BP 110/72 | HR 77 | Temp 97.3°F | Ht 70.0 in | Wt 258.8 lb

## 2022-12-31 DIAGNOSIS — I10 Essential (primary) hypertension: Secondary | ICD-10-CM

## 2022-12-31 DIAGNOSIS — E559 Vitamin D deficiency, unspecified: Secondary | ICD-10-CM | POA: Diagnosis not present

## 2022-12-31 DIAGNOSIS — E1169 Type 2 diabetes mellitus with other specified complication: Secondary | ICD-10-CM

## 2022-12-31 DIAGNOSIS — R7309 Other abnormal glucose: Secondary | ICD-10-CM

## 2022-12-31 DIAGNOSIS — Z79899 Other long term (current) drug therapy: Secondary | ICD-10-CM | POA: Diagnosis not present

## 2022-12-31 DIAGNOSIS — E1122 Type 2 diabetes mellitus with diabetic chronic kidney disease: Secondary | ICD-10-CM

## 2022-12-31 DIAGNOSIS — E782 Mixed hyperlipidemia: Secondary | ICD-10-CM

## 2022-12-31 DIAGNOSIS — N182 Chronic kidney disease, stage 2 (mild): Secondary | ICD-10-CM

## 2022-12-31 MED ORDER — MOUNJARO 10 MG/0.5ML ~~LOC~~ SOAJ
10.0000 mg | SUBCUTANEOUS | 3 refills | Status: DC
Start: 2022-12-31 — End: 2023-01-16

## 2022-12-31 NOTE — Patient Instructions (Signed)

## 2023-01-01 LAB — CBC WITH DIFFERENTIAL/PLATELET
Absolute Monocytes: 680 {cells}/uL (ref 200–950)
Basophils Absolute: 53 cells/uL (ref 0–200)
Basophils Relative: 0.8 %
Eosinophils Absolute: 119 {cells}/uL (ref 15–500)
Eosinophils Relative: 1.8 %
HCT: 44.7 % (ref 38.5–50.0)
Hemoglobin: 15.2 g/dL (ref 13.2–17.1)
Lymphs Abs: 1742 cells/uL (ref 850–3900)
MCH: 31.3 pg (ref 27.0–33.0)
MCHC: 34 g/dL (ref 32.0–36.0)
MCV: 92 fL (ref 80.0–100.0)
MPV: 11.6 fL (ref 7.5–12.5)
Monocytes Relative: 10.3 %
Neutro Abs: 4006 {cells}/uL (ref 1500–7800)
Neutrophils Relative %: 60.7 %
Platelets: 207 10*3/uL (ref 140–400)
RBC: 4.86 10*6/uL (ref 4.20–5.80)
RDW: 11.9 % (ref 11.0–15.0)
Total Lymphocyte: 26.4 %
WBC: 6.6 10*3/uL (ref 3.8–10.8)

## 2023-01-01 LAB — LIPID PANEL
Cholesterol: 155 mg/dL (ref <200)
HDL: 42 mg/dL (ref >=40)
LDL Cholesterol (Calc): 92 mg/dL
Non-HDL Cholesterol (Calc): 113 mg/dL (ref <130)
Total CHOL/HDL Ratio: 3.7 (calc) (ref <5.0)
Triglycerides: 115 mg/dL (ref <150)

## 2023-01-01 LAB — COMPLETE METABOLIC PANEL WITH GFR
AG Ratio: 2 (calc) (ref 1.0–2.5)
ALT: 17 U/L (ref 9–46)
AST: 15 U/L (ref 10–35)
Albumin: 4.7 g/dL (ref 3.6–5.1)
Alkaline phosphatase (APISO): 77 U/L (ref 35–144)
BUN: 17 mg/dL (ref 7–25)
CO2: 28 mmol/L (ref 20–32)
Calcium: 9.6 mg/dL (ref 8.6–10.3)
Chloride: 102 mmol/L (ref 98–110)
Creat: 0.86 mg/dL (ref 0.70–1.30)
Globulin: 2.4 g/dL (ref 1.9–3.7)
Glucose, Bld: 100 mg/dL — ABNORMAL HIGH (ref 65–99)
Potassium: 4.4 mmol/L (ref 3.5–5.3)
Sodium: 138 mmol/L (ref 135–146)
Total Bilirubin: 0.8 mg/dL (ref 0.2–1.2)
Total Protein: 7.1 g/dL (ref 6.1–8.1)
eGFR: 100 mL/min/{1.73_m2} (ref >=60)

## 2023-01-01 LAB — MAGNESIUM: Magnesium: 2.1 mg/dL (ref 1.5–2.5)

## 2023-01-01 LAB — HEMOGLOBIN A1C W/OUT EAG: Hgb A1c MFr Bld: 6 %{Hb} — ABNORMAL HIGH (ref <5.7)

## 2023-01-03 ENCOUNTER — Encounter: Payer: Self-pay | Admitting: Gastroenterology

## 2023-01-16 ENCOUNTER — Encounter: Payer: Self-pay | Admitting: Gastroenterology

## 2023-01-16 ENCOUNTER — Ambulatory Visit (AMBULATORY_SURGERY_CENTER): Payer: 59 | Admitting: Gastroenterology

## 2023-01-16 VITALS — BP 124/68 | HR 67 | Temp 98.4°F | Resp 12 | Ht 70.0 in | Wt 260.0 lb

## 2023-01-16 DIAGNOSIS — Z86010 Personal history of colonic polyps: Secondary | ICD-10-CM

## 2023-01-16 DIAGNOSIS — D122 Benign neoplasm of ascending colon: Secondary | ICD-10-CM

## 2023-01-16 DIAGNOSIS — Z09 Encounter for follow-up examination after completed treatment for conditions other than malignant neoplasm: Secondary | ICD-10-CM

## 2023-01-16 DIAGNOSIS — D123 Benign neoplasm of transverse colon: Secondary | ICD-10-CM | POA: Diagnosis not present

## 2023-01-16 DIAGNOSIS — K635 Polyp of colon: Secondary | ICD-10-CM | POA: Diagnosis not present

## 2023-01-16 MED ORDER — SODIUM CHLORIDE 0.9 % IV SOLN: 500.0000 mL | INTRAVENOUS | Status: DC

## 2023-01-16 NOTE — Progress Notes (Signed)
History and Physical:  This patient presents for endoscopic testing for: Encounter Diagnosis  Name Primary?   Personal history of colonic polyps Yes    Surveillance colonoscopy today.  TA and SSP polyps July 2021 Patient denies chronic abdominal pain, rectal bleeding, constipation or diarrhea.   Patient is otherwise without complaints or active issues today.   Past Medical History: Past Medical History:  Diagnosis Date   Allergy    Anxiety    Chest pain    Elevated hemoglobin A1c    GERD (gastroesophageal reflux disease)    Hyperlipidemia    Hypertension    Hypogonadism male    Obese    PND (post-nasal drip)    Prediabetes    SOB (shortness of breath) on exertion    Vitamin D deficiency      Past Surgical History: Past Surgical History:  Procedure Laterality Date   23 HOUR PH STUDY     COLONOSCOPY     LEFT HEART CATHETERIZATION WITH CORONARY ANGIOGRAM N/A 02/19/2013   Procedure: LEFT HEART CATHETERIZATION WITH CORONARY ANGIOGRAM;  Surgeon: Wendall Stade, MD;  Location: [facility] CATH LAB;  Service: Cardiovascular;  Laterality: N/A;   POLYPECTOMY     SKIN CANCER EXCISION  2004   squamous cell    Allergies: Allergies  Allergen Reactions   Bee Venom Anaphylaxis   Chantix [Varenicline]     Outpatient Meds: Current Outpatient Medications  Medication Sig Dispense Refill   Cholecalciferol (VITAMIN D) 125 MCG (5000 UT) CAPS Take by mouth.     citalopram (CELEXA) 40 MG tablet TAKE 1 TABLET BY MOUTH EVERY DAY FOR MOOD 90 tablet 3   famotidine (PEPCID) 20 MG tablet TAKE 1 TABLET BY MOUTH TWICE A DAY AS NEEDED FOR INDIGESTION & HEARTBURN 180 tablet 3   lisinopril (ZESTRIL) 10 MG tablet Take  1 tablet  daily for BP & Kidney Protection                                     /                 TAKE                              BY                    MOUTH 90 tablet 3   Multiple Vitamins-Minerals (MULTIVITAMIN WITH MINERALS) tablet Take 1 tablet by mouth daily.     zinc gluconate 50  MG tablet Take 50 mg by mouth daily.     Cinnamon 500 MG TABS Take 1 tablet by mouth daily.  (Patient not taking: Reported on 12/30/2022)     dicyclomine (BENTYL) 20 MG tablet TAKE 1 TABLET BY MOUTH THREE TIMES A DAY AS NEEDED FOR NAUSEA/CRAMPING BLOATING OR DIARRHEA 270 tablet 3   ibuprofen (ADVIL) 600 MG tablet Take 600 mg by mouth 3 (three) times daily.     sildenafil (VIAGRA) 100 MG tablet Take 1/2 to 1 tablet   Daily as needed for XXXX                                                                      /  TAKE                                         BY                                                 MOUTH     TAKE 1/2 TO 1 TABLET BY MOUTH DAILY AS NEEDED FOR XXXX 30 tablet 1   topiramate (TOPAMAX) 50 MG tablet Take 1/2 to 1 tablet 2 x /day at Suppertime & Bedtime for Dieting & Weight Loss 180 tablet 1   Current Facility-Administered Medications  Medication Dose Route Frequency Provider Last Rate Last Admin   0.9 %  sodium chloride infusion  500 mL Intravenous Continuous Charlie Pitter III, MD          ___________________________________________________________________ Objective   Exam:  BP (!) 151/96   Pulse 67   Temp 98.4 F (36.9 C)   Resp 13   Ht 5\' 10"  (1.778 m)   Wt 260 lb (117.9 kg)   SpO2 96%   BMI 37.31 kg/m   CV: regular , S1/S2 Resp: clear to auscultation bilaterally, normal RR and effort noted GI: soft, no tenderness, with active bowel sounds.   Assessment: Encounter Diagnosis  Name Primary?   Personal history of colonic polyps Yes     Plan: Colonoscopy   The benefits and risks of the planned procedure were described in detail with the patient or (when appropriate) their health care proxy.  Risks were outlined as including, but not limited to, bleeding, infection, perforation, adverse medication reaction leading to cardiac or pulmonary decompensation, pancreatitis (if ERCP).  The limitation  of incomplete mucosal visualization was also discussed.  No guarantees or warranties were given.  The patient is appropriate for an endoscopic procedure in the ambulatory setting.   - Ryan Jupiter, MD

## 2023-01-16 NOTE — Progress Notes (Signed)
Pt's states no medical or surgical changes since previsit or office visit. 

## 2023-01-16 NOTE — Progress Notes (Signed)
Uneventful anesthetic. Report to pacu rn. Vss. Care resumed by rn. 

## 2023-01-16 NOTE — Progress Notes (Signed)
Called to room to assist during endoscopic procedure.  Patient ID and intended procedure confirmed with present staff. Received instructions for my participation in the procedure from the performing physician.  

## 2023-01-16 NOTE — Patient Instructions (Addendum)
-   Resume previous diet. - Continue present medications. - Await pathology results. - Repeat colonoscopy is recommended for surveillance. The colonoscopy date will be determined after pathology results from today's exam become available for review. For next colonoscopy-1 capful of MiraLAX the morning of prep day, Suprep as used on this exam, but start evening  dose earlier to allow consumption of more water with prep. (Due to diverticulosis)  YOU HAD AN ENDOSCOPIC PROCEDURE TODAY AT THE [facility]:   Refer to the procedure report that was given to you for any specific questions about what was found during the examination.  If the procedure report does not answer your questions, please call your gastroenterologist to clarify.  If you requested that your care partner not be given the details of your procedure findings, then the procedure report has been included in a sealed envelope for you to review at your convenience later.  YOU SHOULD EXPECT: Some feelings of bloating in the abdomen. Passage of more gas than usual.  Walking can help get rid of the air that was put into your GI tract during the procedure and reduce the bloating. If you had a lower endoscopy (such as a colonoscopy or flexible sigmoidoscopy) you may notice spotting of blood in your stool or on the toilet paper. If you underwent a bowel prep for your procedure, you may not have a normal bowel movement for a few days.  Please Note:  You might notice some irritation and congestion in your nose or some drainage.  This is from the oxygen used during your procedure.  There is no need for concern and it should clear up in a day or so.  SYMPTOMS TO REPORT IMMEDIATELY:  Following lower endoscopy (colonoscopy or flexible sigmoidoscopy):  Excessive amounts of blood in the stool  Significant tenderness or worsening of abdominal pains  Swelling of the abdomen that is new, acute  Fever of 100F or higher  For urgent or emergent  issues, a gastroenterologist can be reached at any hour by calling [phone]. Do not use MyChart messaging for urgent concerns.    DIET:  We do recommend a small meal at first, but then you may proceed to your regular diet.  Drink plenty of fluids but you should avoid alcoholic beverages for 24 hours.  ACTIVITY:  You should plan to take it easy for the rest of today and you should NOT DRIVE or use heavy machinery until tomorrow (because of the sedation medicines used during the test).    FOLLOW UP: Our staff will call the number listed on your records the next business day following your procedure.  We will call around 7:15- 8:00 am to check on you and address any questions or concerns that you may have regarding the information given to you following your procedure. If we do not reach you, we will leave a message.     If any biopsies were taken you will be contacted by phone or by letter within the next 1-3 weeks.  Please call us at [phone] if you have not heard about the biopsies in 3 weeks.    SIGNATURES/CONFIDENTIALITY: You and/or your care partner have signed paperwork which will be entered into your electronic medical record.  These signatures attest to the fact that that the information above on your After Visit Summary has been reviewed and is understood.  Full responsibility of the confidentiality of this discharge information lies with you and/or your care-partner.

## 2023-01-16 NOTE — Op Note (Signed)
[facility] Patient Name: Ryan Nguyen Procedure Date: 01/16/2023 7:58 AM MRN: [id number] Endoscopist: Sherilyn Cooter L. Myrtie Neither , MD, [id number] Age: 57 Referring MD:  Date of Birth: June 30, 1965 Gender: Male Account #: [id number] Procedure:                Colonoscopy Indications:              High risk colon cancer surveillance: Personal                            history of colonic polyps                           July 20 21-8 mm cecal SSP, 3 diminutive transverse                            colon TA and SSP                           Diminutive cecal TA June 2016 Medicines:                Monitored Anesthesia Care Procedure:                Pre-Anesthesia Assessment:                           - Prior to the procedure, a History and Physical                            was performed, and patient medications and                            allergies were reviewed. The patient's tolerance of                            previous anesthesia was also reviewed. The risks                            and benefits of the procedure and the sedation                            options and risks were discussed with the patient.                            All questions were answered, and informed consent                            was obtained. Prior Anticoagulants: The patient has                            taken no anticoagulant or antiplatelet agents. ASA                            Grade Assessment: II - A patient with mild systemic  disease. After reviewing the risks and benefits,                            the patient was deemed in satisfactory condition to                            undergo the procedure.                           After obtaining informed consent, the colonoscope                            was passed under direct vision. Throughout the                            procedure, the patient's blood pressure, pulse, and                            oxygen  saturations were monitored continuously. The                            CF [id number] #[id number] was introduced through the anus                            and advanced to the the cecum, identified by                            appendiceal orifice and ileocecal valve. The                            colonoscopy was somewhat difficult due to multiple                            diverticula in the colon, a redundant colon and a                            tortuous colon. Successful completion of the                            procedure was aided by straightening and shortening                            the scope to obtain bowel loop reduction. The                            patient tolerated the procedure well. The quality                            of the bowel preparation was good after lavage. The                            ileocecal valve, appendiceal orifice, and rectum  were photographed. The bowel preparation used was                            SUPREP via split dose instruction. Scope In: 8:07:04 AM Scope Out: 8:27:13 AM Scope Withdrawal Time: 0 hours 12 minutes 20 seconds  Total Procedure Duration: 0 hours 20 minutes 9 seconds  Findings:                 The perianal and digital rectal examinations were                            normal.                           Repeat examination of right colon under NBI                            performed.                           Multiple diverticula were found in the left colon                            (with associated tortuosity and haustral                            thickening).                           Two sessile polyps were found in the transverse                            colon and ascending colon. The polyps were 2 to 5                            mm in size. These polyps were removed with a cold                            snare. Resection and retrieval were complete.                           Internal hemorrhoids  were found.                           The exam was otherwise without abnormality on                            direct and retroflexion views. Complications:            No immediate complications. Estimated Blood Loss:     Estimated blood loss was minimal. Impression:               - Diverticulosis in the left colon.                           - Two 2 to 5 mm polyps in the transverse colon and  in the ascending colon, removed with a cold snare.                            Resected and retrieved.                           - Internal hemorrhoids.                           - The examination was otherwise normal on direct                            and retroflexion views. Recommendation:           - Patient has a contact number available for                            emergencies. The signs and symptoms of potential                            delayed complications were discussed with the                            patient. Return to normal activities tomorrow.                            Written discharge instructions were provided to the                            patient.                           - Resume previous diet.                           - Continue present medications.                           - Await pathology results.                           - Repeat colonoscopy is recommended for                            surveillance. The colonoscopy date will be                            determined after pathology results from today's                            exam become available for review. For next                            colonoscopy-1 capful of MiraLAX the morning of prep                            day, Suprep as  used on this exam, but start evening                            dose earlier to allow consumption of more water                            with prep. (Due to diverticulosis) Sherilyn Cooter L. Myrtie Neither, MD 01/16/2023 8:34:19 AM This report has been signed  electronically.

## 2023-01-17 ENCOUNTER — Telehealth: Payer: Self-pay | Admitting: *Deleted

## 2023-01-17 NOTE — Telephone Encounter (Signed)
  Follow up Call-     01/16/2023    7:31 AM  Call back number  Post procedure Call Back phone  # [phone]  Permission to leave phone message Yes     Patient questions:  Message left to call us if necessary.

## 2023-01-20 LAB — SURGICAL PATHOLOGY

## 2023-01-21 ENCOUNTER — Encounter: Payer: Self-pay | Admitting: Gastroenterology

## 2023-02-10 ENCOUNTER — Other Ambulatory Visit: Payer: Self-pay | Admitting: Internal Medicine

## 2023-03-13 ENCOUNTER — Other Ambulatory Visit: Payer: Self-pay | Admitting: Internal Medicine

## 2023-03-13 DIAGNOSIS — E66812 Obesity, class 2: Secondary | ICD-10-CM

## 2023-03-26 ENCOUNTER — Other Ambulatory Visit: Payer: Self-pay | Admitting: Nurse Practitioner

## 2023-04-02 ENCOUNTER — Ambulatory Visit: Payer: 59 | Admitting: Internal Medicine

## 2023-07-03 ENCOUNTER — Ambulatory Visit: Payer: 59 | Admitting: Nurse Practitioner

## 2023-10-01 ENCOUNTER — Encounter: Payer: 59 | Admitting: Internal Medicine

## 2023-12-03 ENCOUNTER — Ambulatory Visit: Admitting: Family Medicine

## 2023-12-04 ENCOUNTER — Encounter: Payer: Self-pay | Admitting: Family Medicine

## 2023-12-04 ENCOUNTER — Other Ambulatory Visit: Payer: Self-pay

## 2023-12-04 ENCOUNTER — Ambulatory Visit: Admitting: Family Medicine

## 2023-12-04 VITALS — BP 132/62 | HR 82 | Temp 97.7°F | Ht 70.0 in | Wt 274.4 lb

## 2023-12-04 DIAGNOSIS — I10 Essential (primary) hypertension: Secondary | ICD-10-CM | POA: Diagnosis not present

## 2023-12-04 DIAGNOSIS — E559 Vitamin D deficiency, unspecified: Secondary | ICD-10-CM

## 2023-12-04 DIAGNOSIS — E782 Mixed hyperlipidemia: Secondary | ICD-10-CM | POA: Diagnosis not present

## 2023-12-04 DIAGNOSIS — Z79899 Other long term (current) drug therapy: Secondary | ICD-10-CM | POA: Insufficient documentation

## 2023-12-04 DIAGNOSIS — Z23 Encounter for immunization: Secondary | ICD-10-CM | POA: Insufficient documentation

## 2023-12-04 DIAGNOSIS — R7309 Other abnormal glucose: Secondary | ICD-10-CM | POA: Diagnosis not present

## 2023-12-04 DIAGNOSIS — E66812 Obesity, class 2: Secondary | ICD-10-CM | POA: Insufficient documentation

## 2023-12-04 DIAGNOSIS — K2100 Gastro-esophageal reflux disease with esophagitis, without bleeding: Secondary | ICD-10-CM | POA: Insufficient documentation

## 2023-12-04 DIAGNOSIS — N528 Other male erectile dysfunction: Secondary | ICD-10-CM | POA: Insufficient documentation

## 2023-12-04 DIAGNOSIS — Z6837 Body mass index (BMI) 37.0-37.9, adult: Secondary | ICD-10-CM

## 2023-12-04 LAB — CBC WITH DIFFERENTIAL/PLATELET
Basophils Absolute: 0.1 K/uL (ref 0.0–0.1)
Basophils Relative: 1.1 % (ref 0.0–3.0)
Eosinophils Absolute: 0.2 K/uL (ref 0.0–0.7)
Eosinophils Relative: 2.5 % (ref 0.0–5.0)
HCT: 44.1 % (ref 39.0–52.0)
Hemoglobin: 15.1 g/dL (ref 13.0–17.0)
Lymphocytes Relative: 29.9 % (ref 12.0–46.0)
Lymphs Abs: 2.4 K/uL (ref 0.7–4.0)
MCHC: 34.1 g/dL (ref 30.0–36.0)
MCV: 89.9 fl (ref 78.0–100.0)
Monocytes Absolute: 0.7 K/uL (ref 0.1–1.0)
Monocytes Relative: 9.3 % (ref 3.0–12.0)
Neutro Abs: 4.5 K/uL (ref 1.4–7.7)
Neutrophils Relative %: 57.2 % (ref 43.0–77.0)
Platelets: 201 K/uL (ref 150.0–400.0)
RBC: 4.91 Mil/uL (ref 4.22–5.81)
RDW: 12.7 % (ref 11.5–15.5)
WBC: 7.9 K/uL (ref 4.0–10.5)

## 2023-12-04 LAB — COMPREHENSIVE METABOLIC PANEL WITH GFR
ALT: 20 U/L (ref 0–53)
AST: 18 U/L (ref 0–37)
Albumin: 4.7 g/dL (ref 3.5–5.2)
Alkaline Phosphatase: 69 U/L (ref 39–117)
BUN: 20 mg/dL (ref 6–23)
CO2: 29 meq/L (ref 19–32)
Calcium: 9.3 mg/dL (ref 8.4–10.5)
Chloride: 102 meq/L (ref 96–112)
Creatinine, Ser: 1.3 mg/dL (ref 0.40–1.50)
GFR: 60.08 mL/min (ref >60.00)
Glucose, Bld: 123 mg/dL — ABNORMAL HIGH (ref 70–99)
Potassium: 3.8 meq/L (ref 3.5–5.1)
Sodium: 139 meq/L (ref 135–145)
Total Bilirubin: 0.7 mg/dL (ref 0.2–1.2)
Total Protein: 7.4 g/dL (ref 6.0–8.3)

## 2023-12-04 LAB — LIPID PANEL
Cholesterol: 202 mg/dL — ABNORMAL HIGH (ref 0–200)
HDL: 39.2 mg/dL (ref >39.00)
LDL Cholesterol: 126 mg/dL — ABNORMAL HIGH (ref 0–99)
NonHDL: 163.19
Total CHOL/HDL Ratio: 5
Triglycerides: 184 mg/dL — ABNORMAL HIGH (ref 0.0–149.0)
VLDL: 36.8 mg/dL (ref 0.0–40.0)

## 2023-12-04 LAB — HEMOGLOBIN A1C: Hgb A1c MFr Bld: 6.8 % — ABNORMAL HIGH (ref 4.6–6.5)

## 2023-12-04 LAB — VITAMIN D 25 HYDROXY (VIT D DEFICIENCY, FRACTURES): VITD: 26.78 ng/mL — ABNORMAL LOW (ref 30.00–100.00)

## 2023-12-04 MED ORDER — LISINOPRIL 10 MG PO TABS: ORAL_TABLET | ORAL | 3 refills | Status: AC

## 2023-12-04 MED ORDER — CITALOPRAM HYDROBROMIDE 40 MG PO TABS: ORAL_TABLET | ORAL | 3 refills | Status: AC

## 2023-12-04 MED ORDER — TOPIRAMATE 25 MG PO TABS: 25.0000 mg | ORAL_TABLET | Freq: Every day | ORAL | 1 refills | Status: AC

## 2023-12-04 MED ORDER — PHENTERMINE HCL 37.5 MG PO TABS: 37.5000 mg | ORAL_TABLET | Freq: Every day | ORAL | 2 refills | Status: AC

## 2023-12-04 MED ORDER — TOPIRAMATE 50 MG PO TABS: ORAL_TABLET | ORAL | 1 refills | Status: DC

## 2023-12-04 MED ORDER — SILDENAFIL CITRATE 100 MG PO TABS: ORAL_TABLET | ORAL | 1 refills | Status: AC

## 2023-12-04 MED ORDER — SILDENAFIL CITRATE 100 MG PO TABS: ORAL_TABLET | ORAL | 1 refills | Status: DC

## 2023-12-04 MED ORDER — FAMOTIDINE 20 MG PO TABS: ORAL_TABLET | ORAL | 3 refills | Status: AC

## 2023-12-04 NOTE — Progress Notes (Signed)
 New Patient Visit  Subjective:     Patient ID: Ryan Nguyen, male    DOB: [birth year], 58 y.o.   MRN: [id number]  No chief complaint on file.   HPI  58 year old male presents to establish care today. He is a previous patient of Dr. Tonita. Requesting refills of medications today. He is not fasting. Inquiring about medications to help him lose weight.  States that he was working with Dr. Tonita on this before he passed away. Has tried phentermine  in the past.  States he had good results with this, inquiring about trying again. Due for Tdap, would like to do this today. Denies other concerns today.   ROS Per HPI  Outpatient Encounter Medications as of 12/04/2023  Medication Sig   phentermine  (ADIPEX-P ) 37.5 MG tablet Take 1 tablet (37.5 mg total) by mouth daily before breakfast.   topiramate  (TOPAMAX ) 25 MG tablet Take 1 tablet (25 mg total) by mouth at bedtime.   Cholecalciferol (VITAMIN D ) 125 MCG (5000 UT) CAPS Take by mouth.   Cinnamon 500 MG TABS Take 1 tablet by mouth daily.  (Patient not taking: Reported on 12/30/2022)   citalopram  (CELEXA ) 40 MG tablet TAKE 1 TABLET BY MOUTH EVERY DAY FOR MOOD   dicyclomine (BENTYL) 20 MG tablet TAKE 1 TABLET BY MOUTH THREE TIMES A DAY AS NEEDED FOR NAUSEA/CRAMPING BLOATING OR DIARRHEA   famotidine  (PEPCID ) 20 MG tablet TAKE 1 TABLET BY MOUTH TWICE A DAY AS NEEDED FOR INDIGESTION & HEARTBURN   ibuprofen (ADVIL) 600 MG tablet Take 600 mg by mouth 3 (three) times daily.   lisinopril  (ZESTRIL ) 10 MG tablet TAKE 1 TABLET BY MOUTH EVERY DAY FOR BLOOD PRESSURE & KIDNEY PROTECTION   Multiple Vitamins-Minerals (MULTIVITAMIN WITH MINERALS) tablet Take 1 tablet by mouth daily.   sildenafil  (VIAGRA ) 100 MG tablet Take 1/2 to 1 tablet   Daily as needed for XXXX                                                                      /                                                                   TAKE                                         BY                                                  MOUTH     TAKE 1/2 TO 1 TABLET BY MOUTH DAILY AS NEEDED FOR XXXX   zinc gluconate 50 MG tablet Take 50 mg by mouth daily.   [DISCONTINUED] citalopram  (CELEXA ) 40 MG tablet TAKE 1 TABLET BY MOUTH EVERY DAY FOR MOOD   [  DISCONTINUED] famotidine  (PEPCID ) 20 MG tablet TAKE 1 TABLET BY MOUTH TWICE A DAY AS NEEDED FOR INDIGESTION & HEARTBURN   [DISCONTINUED] lisinopril  (ZESTRIL ) 10 MG tablet TAKE 1 TABLET BY MOUTH EVERY DAY FOR BLOOD PRESSURE & KIDNEY PROTECTION   [DISCONTINUED] sildenafil  (VIAGRA ) 100 MG tablet Take 1/2 to 1 tablet   Daily as needed for XXXX                                                                      /                                                                   TAKE                                         BY                                                 MOUTH     TAKE 1/2 TO 1 TABLET BY MOUTH DAILY AS NEEDED FOR XXXX   [DISCONTINUED] sildenafil  (VIAGRA ) 100 MG tablet Take 1/2 to 1 tablet   Daily as needed for XXXX                                                                      /                                                                   TAKE                                         BY                                                 MOUTH     TAKE 1/2 TO 1 TABLET BY MOUTH DAILY AS NEEDED FOR XXXX   [DISCONTINUED] topiramate  (TOPAMAX ) 50 MG tablet TAKE 1/2 TO 1 TABLET 2 X /DAY AT SUPPERTIME & BEDTIME FOR DIETING & WEIGHT LOSS   [DISCONTINUED] topiramate  (TOPAMAX ) 50 MG tablet  Take 1/2 to 1 tablet 2 x /day at Suppertime & Bedtime for Dieting & Weight Loss   [DISCONTINUED] topiramate  (TOPAMAX ) 50 MG tablet Take 1/2 to 1 tablet 2 x /day at Suppertime & Bedtime for Dieting & Weight Loss   [DISCONTINUED] topiramate  (TOPAMAX ) 50 MG tablet Take 1/2 to 1 tab at Bedtime for Dieting & Weight Loss   No facility-administered encounter medications on file as of 12/04/2023.    Past Medical History:  Diagnosis Date   Allergy     Anxiety    Chest pain    Elevated hemoglobin A1c    GERD (gastroesophageal reflux disease)    Hyperlipidemia    Hypertension    Hypogonadism male    Obese    PND (post-nasal drip)    Prediabetes    SOB (shortness of breath) on exertion    Vitamin D  deficiency     Past Surgical History:  Procedure Laterality Date   24 HOUR PH STUDY     COLONOSCOPY     LEFT HEART CATHETERIZATION WITH CORONARY ANGIOGRAM N/A 02/19/2013   Procedure: LEFT HEART CATHETERIZATION WITH CORONARY ANGIOGRAM;  Surgeon: Maude JAYSON Emmer, MD;  Location: [facility] CATH LAB;  Service: Cardiovascular;  Laterality: N/A;   POLYPECTOMY     SKIN CANCER EXCISION  2004   squamous cell    Family History  Problem Relation Age of Onset   Hypertension Mother    COPD Mother    Cancer Father        Lung cancer   Diabetes Sister    Colon cancer Neg Hx    Esophageal cancer Neg Hx    Rectal cancer Neg Hx    Stomach cancer Neg Hx    Colon polyps Neg Hx     Social History   Socioeconomic History   Marital status: Married    Spouse name: Not on file   Number of children: 2   Years of education: Not on file   Highest education level: Some college, no degree  Occupational History   Not on file  Tobacco Use   Smoking status: Former    Current packs/day: 0.00    Average packs/day: 2.0 packs/day for 10.0 years (20.0 ttl pk-yrs)    Types: Cigarettes    Quit date: 05/01/2012    Years since quitting: 11.6   Smokeless tobacco: Never  Substance and Sexual Activity   Alcohol use: Yes    Comment: Less then one a week   Drug use: No   Sexual activity: Yes    Birth control/protection: Post-menopausal  Other Topics Concern   Not on file  Social History Narrative   Not on file   Social Drivers of Health   Financial Resource Strain: Low Risk  (11/27/2023)   Overall Financial Resource Strain (CARDIA)    Difficulty of Paying Living Expenses: Not hard at all  Food Insecurity: No Food Insecurity (11/27/2023)   Hunger Vital Sign     Worried About Running Out of Food in the Last Year: Never true    Ran Out of Food in the Last Year: Never true  Transportation Needs: No Transportation Needs (11/27/2023)   PRAPARE - Administrator, Civil Service (Medical): No    Lack of Transportation (Non-Medical): No  Physical Activity: Insufficiently Active (11/27/2023)   Exercise Vital Sign    Days of Exercise per Week: 2 days    Minutes of Exercise per Session: 20 min  Stress: Stress Concern Present (11/27/2023)   [employer]  of Occupational Health - Occupational Stress Questionnaire    Feeling of Stress: To some extent  Social Connections: Socially Integrated (11/27/2023)   Social Connection and Isolation Panel    Frequency of Communication with Friends and Family: Twice a week    Frequency of Social Gatherings with Friends and Family: Once a week    Attends Religious Services: 1 to 4 times per year    Active Member of [employer] or Organizations: Yes    Attends Banker Meetings: 1 to 4 times per year    Marital Status: Married  Catering manager Violence: Unknown (08/08/2021)   Received from [facility]   HITS    Physically Hurt: Not on file    Insult or Talk Down To: Not on file    Threaten Physical Harm: Not on file    Scream or Curse: Not on file       Objective:    BP 132/62 (BP Location: Left Arm, Patient Position: Sitting)   Pulse 82   Temp 97.7 F (36.5 C) (Temporal)   Ht 5' 10 (1.778 m)   Wt 274 lb 6.4 oz (124.5 kg)   SpO2 91%   BMI 39.37 kg/m    Physical Exam Vitals and nursing note reviewed.  Constitutional:      General: He is not in acute distress.    Appearance: Normal appearance. He is obese.  HENT:     Head: Normocephalic and atraumatic.     Right Ear: External ear normal.     Left Ear: External ear normal.     Nose: Nose normal.     Mouth/Throat:     Mouth: Mucous membranes are moist.     Pharynx: Oropharynx is clear.  Eyes:     Extraocular Movements:  Extraocular movements intact.  Cardiovascular:     Rate and Rhythm: Normal rate and regular rhythm.     Pulses: Normal pulses.     Heart sounds: Normal heart sounds.  Pulmonary:     Effort: Pulmonary effort is normal. No respiratory distress.     Breath sounds: Normal breath sounds. No wheezing, rhonchi or rales.  Musculoskeletal:        General: Normal range of motion.     Cervical back: Normal range of motion.     Right lower leg: No edema.     Left lower leg: No edema.  Lymphadenopathy:     Cervical: No cervical adenopathy.  Skin:    General: Skin is warm and dry.  Neurological:     General: No focal deficit present.     Mental Status: He is alert and oriented to person, place, and time.  Psychiatric:        Mood and Affect: Mood normal.        Behavior: Behavior normal.     No results found for any visits on 12/04/23.      Assessment & Plan:   Primary hypertension -     CBC with Differential/Platelet -     Comprehensive metabolic panel with GFR -     Lisinopril ; TAKE 1 TABLET BY MOUTH EVERY DAY FOR BLOOD PRESSURE & KIDNEY PROTECTION  Dispense: 90 tablet; Refill: 3  Mixed hyperlipidemia -     Lipid panel  Morbid obesity (HCC)  Other abnormal glucose (hx of prediabetes) -     Hemoglobin A1c  Vitamin D  deficiency -     VITAMIN D  25 Hydroxy (Vit-D Deficiency, Fractures)  Gastroesophageal reflux disease with esophagitis -  Famotidine ; TAKE 1 TABLET BY MOUTH TWICE A DAY AS NEEDED FOR INDIGESTION & HEARTBURN  Dispense: 180 tablet; Refill: 3  Class 2 severe obesity with serious comorbidity and body mass index (BMI) of 37.0 to 37.9 in adult, unspecified obesity type (HCC) -     Phentermine  HCl; Take 1 tablet (37.5 mg total) by mouth daily before breakfast.  Dispense: 30 tablet; Refill: 2 -     Topiramate ; Take 1 tablet (25 mg total) by mouth at bedtime.  Dispense: 90 tablet; Refill: 1  Immunization due -     Tdap vaccine greater than or equal to 7yo IM  Other  male erectile dysfunction -     Sildenafil  Citrate; Take 1/2 to 1 tablet   Daily as needed for XXXX                                                                      /                                                                   TAKE                                         BY                                                 MOUTH     TAKE 1/2 TO 1 TABLET BY MOUTH DAILY AS NEEDED FOR XXXX  Dispense: 30 tablet; Refill: 1  Medication management -     CBC with Differential/Platelet -     Comprehensive metabolic panel with GFR -     Hemoglobin A1c -     Lipid panel -     VITAMIN D  25 Hydroxy (Vit-D Deficiency, Fractures) -     Famotidine ; TAKE 1 TABLET BY MOUTH TWICE A DAY AS NEEDED FOR INDIGESTION & HEARTBURN  Dispense: 180 tablet; Refill: 3 -     Lisinopril ; TAKE 1 TABLET BY MOUTH EVERY DAY FOR BLOOD PRESSURE & KIDNEY PROTECTION  Dispense: 90 tablet; Refill: 3 -     Citalopram  Hydrobromide; TAKE 1 TABLET BY MOUTH EVERY DAY FOR MOOD  Dispense: 90 tablet; Refill: 3 -     Sildenafil  Citrate; Take 1/2 to 1 tablet   Daily as needed for XXXX                                                                      /  TAKE                                         BY                                                 MOUTH     TAKE 1/2 TO 1 TABLET BY MOUTH DAILY AS NEEDED FOR XXXX  Dispense: 30 tablet; Refill: 1 -     Phentermine  HCl; Take 1 tablet (37.5 mg total) by mouth daily before breakfast.  Dispense: 30 tablet; Refill: 2 -     Tdap vaccine greater than or equal to 7yo IM -     Topiramate ; Take 1 tablet (25 mg total) by mouth at bedtime.  Dispense: 90 tablet; Refill: 1      Orders Placed This Encounter  Procedures   Tdap vaccine greater than or equal to 7yo IM   CBC with Differential/Platelet    Release to patient:   Immediate [1]   Comprehensive metabolic panel with GFR    Release to patient:   Immediate [1]   Hemoglobin A1c    Lipid panel   VITAMIN D  25 Hydroxy (Vit-D Deficiency, Fractures)     Meds ordered this encounter  Medications   famotidine  (PEPCID ) 20 MG tablet    Sig: TAKE 1 TABLET BY MOUTH TWICE A DAY AS NEEDED FOR INDIGESTION & HEARTBURN    Dispense:  180 tablet    Refill:  3   lisinopril  (ZESTRIL ) 10 MG tablet    Sig: TAKE 1 TABLET BY MOUTH EVERY DAY FOR BLOOD PRESSURE & KIDNEY PROTECTION    Dispense:  90 tablet    Refill:  3   DISCONTD: sildenafil  (VIAGRA ) 100 MG tablet    Sig: Take 1/2 to 1 tablet   Daily as needed for XXXX                                                                      /                                                                   TAKE                                         BY                                                 MOUTH     TAKE 1/2 TO 1 TABLET BY MOUTH DAILY AS NEEDED FOR XXXX  Dispense:  30 tablet    Refill:  1   citalopram  (CELEXA ) 40 MG tablet    Sig: TAKE 1 TABLET BY MOUTH EVERY DAY FOR MOOD    Dispense:  90 tablet    Refill:  3   DISCONTD: topiramate  (TOPAMAX ) 50 MG tablet    Sig: Take 1/2 to 1 tablet 2 x /day at Suppertime & Bedtime for Dieting & Weight Loss    Dispense:  180 tablet    Refill:  1   sildenafil  (VIAGRA ) 100 MG tablet    Sig: Take 1/2 to 1 tablet   Daily as needed for XXXX                                                                      /                                                                   TAKE                                         BY                                                 MOUTH     TAKE 1/2 TO 1 TABLET BY MOUTH DAILY AS NEEDED FOR XXXX    Dispense:  30 tablet    Refill:  1   phentermine  (ADIPEX-P ) 37.5 MG tablet    Sig: Take 1 tablet (37.5 mg total) by mouth daily before breakfast.    Dispense:  30 tablet    Refill:  2   DISCONTD: topiramate  (TOPAMAX ) 50 MG tablet    Sig: Take 1/2 to 1 tablet 2 x /day at Suppertime & Bedtime for Dieting & Weight Loss    Dispense:  180 tablet    Refill:  1    DISCONTD: topiramate  (TOPAMAX ) 50 MG tablet    Sig: Take 1/2 to 1 tab at Bedtime for Dieting & Weight Loss    Dispense:  90 tablet    Refill:  1   topiramate  (TOPAMAX ) 25 MG tablet    Sig: Take 1 tablet (25 mg total) by mouth at bedtime.    Dispense:  90 tablet    Refill:  1    Return in about 3 months (around 03/05/2024).  Corean LITTIE Ku, FNP

## 2023-12-04 NOTE — Patient Instructions (Addendum)
 Welcome to [facility]!  Thank you for choosing us  for your Primary Care needs.   We offer in person and video appointments for your convenience. You may call our office to schedule appointments, or you may schedule appointments with me through MyChart.   The best way to get in contact with me is via MyChart message. This will get to me faster than a phone call, unless there is an emergency, then please call 911.  The lab is located downstairs in the Sports Medicine building, we also have xray available there.   We are checking labs today, will be in contact with any results that require further attention.  We have given your Tdap vaccine today.   Follow up with me in about 3 months for labs and medication management, sooner if needed.

## 2023-12-09 ENCOUNTER — Ambulatory Visit: Payer: Self-pay | Admitting: Family Medicine

## 2023-12-09 MED ORDER — VITAMIN D (ERGOCALCIFEROL) 1.25 MG (50000 UNIT) PO CAPS: 50000.0000 [IU] | ORAL_CAPSULE | ORAL | 0 refills | Status: AC

## 2024-01-31 ENCOUNTER — Other Ambulatory Visit: Payer: Self-pay | Admitting: Family Medicine

## 2024-03-05 ENCOUNTER — Encounter: Payer: Self-pay | Admitting: Family Medicine

## 2024-03-05 ENCOUNTER — Ambulatory Visit: Admitting: Family Medicine

## 2024-03-05 VITALS — BP 128/80 | HR 91 | Temp 97.7°F | Ht 70.0 in | Wt 269.2 lb

## 2024-03-05 DIAGNOSIS — I10 Essential (primary) hypertension: Secondary | ICD-10-CM | POA: Diagnosis not present

## 2024-03-05 DIAGNOSIS — Z6837 Body mass index (BMI) 37.0-37.9, adult: Secondary | ICD-10-CM

## 2024-03-05 DIAGNOSIS — M19041 Primary osteoarthritis, right hand: Secondary | ICD-10-CM | POA: Diagnosis not present

## 2024-03-05 DIAGNOSIS — E1165 Type 2 diabetes mellitus with hyperglycemia: Secondary | ICD-10-CM | POA: Diagnosis not present

## 2024-03-05 DIAGNOSIS — Z23 Encounter for immunization: Secondary | ICD-10-CM

## 2024-03-05 DIAGNOSIS — Z1159 Encounter for screening for other viral diseases: Secondary | ICD-10-CM | POA: Insufficient documentation

## 2024-03-05 DIAGNOSIS — N528 Other male erectile dysfunction: Secondary | ICD-10-CM | POA: Diagnosis not present

## 2024-03-05 DIAGNOSIS — Z87891 Personal history of nicotine dependence: Secondary | ICD-10-CM

## 2024-03-05 DIAGNOSIS — E66812 Obesity, class 2: Secondary | ICD-10-CM

## 2024-03-05 DIAGNOSIS — Z79899 Other long term (current) drug therapy: Secondary | ICD-10-CM | POA: Diagnosis not present

## 2024-03-05 LAB — MICROALBUMIN / CREATININE URINE RATIO
Creatinine,U: 159 mg/dL
Microalb Creat Ratio: 6.7 mg/g (ref 0.0–30.0)
Microalb, Ur: 1.1 mg/dL (ref 0.0–1.9)

## 2024-03-05 LAB — COMPREHENSIVE METABOLIC PANEL WITH GFR
ALT: 23 U/L (ref 0–53)
AST: 18 U/L (ref 0–37)
Albumin: 4.9 g/dL (ref 3.5–5.2)
Alkaline Phosphatase: 71 U/L (ref 39–117)
BUN: 20 mg/dL (ref 6–23)
CO2: 25 meq/L (ref 19–32)
Calcium: 9.3 mg/dL (ref 8.4–10.5)
Chloride: 102 meq/L (ref 96–112)
Creatinine, Ser: 0.96 mg/dL (ref 0.40–1.50)
GFR: 86.29 mL/min (ref >60.00)
Glucose, Bld: 135 mg/dL — ABNORMAL HIGH (ref 70–99)
Potassium: 4.3 meq/L (ref 3.5–5.1)
Sodium: 137 meq/L (ref 135–145)
Total Bilirubin: 0.8 mg/dL (ref 0.2–1.2)
Total Protein: 7.8 g/dL (ref 6.0–8.3)

## 2024-03-05 LAB — CBC WITH DIFFERENTIAL/PLATELET
Basophils Absolute: 0.1 K/uL (ref 0.0–0.1)
Basophils Relative: 1 % (ref 0.0–3.0)
Eosinophils Absolute: 0.3 K/uL (ref 0.0–0.7)
Eosinophils Relative: 4.2 % (ref 0.0–5.0)
HCT: 46.1 % (ref 39.0–52.0)
Hemoglobin: 15.8 g/dL (ref 13.0–17.0)
Lymphocytes Relative: 28.6 % (ref 12.0–46.0)
Lymphs Abs: 2 K/uL (ref 0.7–4.0)
MCHC: 34.3 g/dL (ref 30.0–36.0)
MCV: 91.1 fl (ref 78.0–100.0)
Monocytes Absolute: 0.7 K/uL (ref 0.1–1.0)
Monocytes Relative: 10.3 % (ref 3.0–12.0)
Neutro Abs: 3.9 K/uL (ref 1.4–7.7)
Neutrophils Relative %: 55.9 % (ref 43.0–77.0)
Platelets: 218 K/uL (ref 150.0–400.0)
RBC: 5.05 Mil/uL (ref 4.22–5.81)
RDW: 13.2 % (ref 11.5–15.5)
WBC: 7 K/uL (ref 4.0–10.5)

## 2024-03-05 LAB — HEMOGLOBIN A1C: Hgb A1c MFr Bld: 6.6 % — ABNORMAL HIGH (ref 4.6–6.5)

## 2024-03-05 LAB — TESTOSTERONE: Testosterone: 223.86 ng/dL — ABNORMAL LOW (ref 300.00–890.00)

## 2024-03-05 NOTE — Progress Notes (Signed)
 Established Patient Office Visit  Subjective:     Patient ID: Ryan Nguyen, male    DOB: 04/10/1966, 58 y.o.   MRN: [id number]  Chief Complaint  Patient presents with   Follow-up    HPI  Discussed the use of AI scribe software for clinical note transcription with the patient, who gave verbal consent to proceed.  History of Present Illness Ryan Nguyen is a 58 year old male who presents for medication management and lab work.  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  Constitutional:      General: He is not in acute distress.    Appearance: Normal appearance. He is obese.  HENT:     Head: Normocephalic and atraumatic.     Right Ear: External ear normal.     Left Ear: External ear normal.     Nose: Nose normal.     Mouth/Throat:     Mouth: Mucous membranes are moist.     Pharynx: Oropharynx is clear.  Eyes:     Extraocular Movements: Extraocular movements intact.  Cardiovascular:     Rate and Rhythm: Normal rate and regular rhythm.     Pulses: Normal  pulses.  Pulmonary:     Effort: Pulmonary effort is normal. No respiratory distress.     Breath sounds: Normal breath sounds. No wheezing, rhonchi or rales.  Musculoskeletal:        General: Swelling present. Normal range of motion.     Cervical back: Normal range of motion.     Right lower leg: No edema.     Left lower leg: No edema.     Comments: MIP joint of right thumb swollen, nontender, no erythema, no bruising, no lesions noted  Lymphadenopathy:     Cervical: No cervical adenopathy.  Skin:    General: Skin is warm and dry.  Neurological:     General: No focal deficit present.     Mental Status: He is alert and oriented to person, place, and time.  Psychiatric:        Mood and Affect: Mood normal.        Behavior: Behavior normal.     No results found for any visits on 03/05/24.  The 10-year ASCVD risk score (Arnett DK, et al., 2019) is: 20.4%  BP Readings from Last 3 Encounters:  03/05/24 128/80  12/04/23 132/62  01/16/23 124/68   Wt Readings from Last 3 Encounters:  03/05/24 269 lb 3.2 oz (122.1  kg)  12/04/23 274 lb 6.4 oz (124.5 kg)  01/16/23 260 lb (117.9 kg)      Last CBC Lab Results  Component Value Date   WBC 7.9 12/04/2023   HGB 15.1 12/04/2023   HCT 44.1 12/04/2023   MCV 89.9 12/04/2023   MCH 31.3 12/31/2022   RDW 12.7 12/04/2023   PLT 201.0 12/04/2023   Last metabolic panel Lab Results  Component Value Date   GLUCOSE 123 (H) 12/04/2023   NA 139 12/04/2023   K 3.8 12/04/2023   CL 102 12/04/2023   CO2 29 12/04/2023   BUN 20 12/04/2023   CREATININE 1.30 12/04/2023   GFR 60.08 12/04/2023   CALCIUM  9.3 12/04/2023   PROT 7.4 12/04/2023   ALBUMIN 4.7 12/04/2023   BILITOT 0.7 12/04/2023   ALKPHOS 69 12/04/2023   AST 18 12/04/2023   ALT 20 12/04/2023   Last lipids Lab Results  Component Value Date   CHOL 202 (H) 12/04/2023   HDL 39.20 12/04/2023   LDLCALC 126 (H) 12/04/2023   TRIG 184.0 (H) 12/04/2023   CHOLHDL 5 12/04/2023   Last  hemoglobin A1c Lab Results  Component Value Date   HGBA1C 6.8 (H) 12/04/2023   Last thyroid  functions Lab Results  Component Value Date   TSH 2.99 09/10/2022   Last vitamin D  Lab Results  Component Value Date   VD25OH 26.78 (L) 12/04/2023   Last vitamin B12 and Folate Lab Results  Component Value Date   VITAMINB12 337 09/10/2022         Assessment & Plan:   Assessment and Plan Assessment & Plan 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  Medication Management Laboratory Monitoring Medications well-managed. Hemoglobin A1c at 6.8 three months ago, indicating controlled glucose. - Will check labs this adjust medications as indicated  Type 2 diabetes with hyperglycemia without long-term use of insulin  Diet controlled A1c today  Need for Hep C screen Hep C screening ordered today  Immunization due Discussed RSV vaccine, administered. Discussed new COVID vaccine, patient hesitant. -Flu vaccine given today -Prevnar 20 given today  Former smoker Discussed lung cancer screening with chest CT due to risk factors. - Order chest CT for lung cancer screening.  Severe obesity, BMI 38 -Continue efforts in healthy diet and activity level -May continue phentermine  and Topamax   Erectile dysfunction - Stable -Continue Viagra  as needed  Essential hypertension -Chronic, stable -CBC, CMP, microalbumin today - Continue lisinopril     Orders Placed This Encounter  Procedures   CT CHEST LUNG CANCER SCREENING LOW DOSE WO CONTRAST    Standing Status:   Future    Expiration Date:   03/05/2025    Preferred Imaging Location?:   [facility]   Flu vaccine trivalent PF, 6mos and older(Flulaval,Afluria,Fluarix,Fluzone)   Pneumococcal conjugate vaccine 20-valent (Prevnar 20)   Comprehensive metabolic panel with GFR     Release to patient:   Immediate [1]   CBC with Differential/Platelet    Release to patient:   Immediate [1]   Hemoglobin A1c   Microalbumin / creatinine urine ratio    Release to patient:   Immediate   Testosterone    Hepatitis C Antibody     No orders of the defined types were placed in this encounter.   Return in about 6 months (around 09/02/2024) for CPE.  Corean LITTIE Ku, FNP

## 2024-03-05 NOTE — Patient Instructions (Addendum)
 Continue current medication regimen for now.   We are checking labs today, will be in contact with any results that require further attention  I have sent in an order for a lung cancer screening. Someone will be reaching out to get you scheduled.   We have given your flu vaccine today.   We have given your pneumonia vaccine today.  Follow-up with me in 6 mos for your physical, sooner if needed.

## 2024-03-06 LAB — HEPATITIS C ANTIBODY: Hepatitis C Ab: NONREACTIVE

## 2024-03-07 ENCOUNTER — Ambulatory Visit: Payer: Self-pay | Admitting: Family Medicine

## 2024-03-12 ENCOUNTER — Ambulatory Visit
Admission: RE | Admit: 2024-03-12 | Discharge: 2024-03-12 | Disposition: A | Discharge status: Home / Self Care | Source: Ambulatory Visit | Attending: Family Medicine | Admitting: Family Medicine

## 2024-03-12 DIAGNOSIS — Z87891 Personal history of nicotine dependence: Secondary | ICD-10-CM

## 2024-09-02 ENCOUNTER — Ambulatory Visit: Admitting: Family Medicine
